# Patient Record
Sex: Male | Born: 1950 | Race: White | Hispanic: No | Marital: Married | Smoking: Former smoker
Health system: Southern US, Community
[De-identification: ages and names within clinical notes are randomized; demographics above are authoritative.]

## PROBLEM LIST (undated history)

## (undated) DIAGNOSIS — I10 Essential (primary) hypertension: Secondary | ICD-10-CM

## (undated) DIAGNOSIS — F419 Anxiety disorder, unspecified: Secondary | ICD-10-CM

## (undated) DIAGNOSIS — M1710 Unilateral primary osteoarthritis, unspecified knee: Secondary | ICD-10-CM

## (undated) DIAGNOSIS — J45909 Unspecified asthma, uncomplicated: Secondary | ICD-10-CM

## (undated) DIAGNOSIS — K219 Gastro-esophageal reflux disease without esophagitis: Secondary | ICD-10-CM

## (undated) DIAGNOSIS — G4700 Insomnia, unspecified: Secondary | ICD-10-CM

## (undated) DIAGNOSIS — M179 Osteoarthritis of knee, unspecified: Secondary | ICD-10-CM

## (undated) DIAGNOSIS — Z8489 Family history of other specified conditions: Secondary | ICD-10-CM

## (undated) DIAGNOSIS — E119 Type 2 diabetes mellitus without complications: Secondary | ICD-10-CM

## (undated) DIAGNOSIS — G709 Myoneural disorder, unspecified: Secondary | ICD-10-CM

## (undated) DIAGNOSIS — F429 Obsessive-compulsive disorder, unspecified: Secondary | ICD-10-CM

## (undated) HISTORY — DX: Insomnia, unspecified: G47.00

## (undated) HISTORY — PX: TONSILLECTOMY: SUR1361

## (undated) HISTORY — DX: Gastro-esophageal reflux disease without esophagitis: K21.9

## (undated) HISTORY — DX: Osteoarthritis of knee, unspecified: M17.9

## (undated) HISTORY — PX: JOINT REPLACEMENT: SHX530

## (undated) HISTORY — DX: Anxiety disorder, unspecified: F41.9

## (undated) HISTORY — DX: Essential (primary) hypertension: I10

## (undated) HISTORY — DX: Obsessive-compulsive disorder, unspecified: F42.9

## (undated) HISTORY — PX: OTHER SURGICAL HISTORY: SHX169

## (undated) HISTORY — DX: Unilateral primary osteoarthritis, unspecified knee: M17.10

## (undated) HISTORY — DX: Type 2 diabetes mellitus without complications: E11.9

---

## 1973-09-27 HISTORY — PX: ANKLE SURGERY: SHX546

## 2005-08-27 DIAGNOSIS — N529 Male erectile dysfunction, unspecified: Secondary | ICD-10-CM | POA: Insufficient documentation

## 2006-09-27 DIAGNOSIS — E781 Pure hyperglyceridemia: Secondary | ICD-10-CM | POA: Insufficient documentation

## 2009-03-10 DIAGNOSIS — G4700 Insomnia, unspecified: Secondary | ICD-10-CM | POA: Insufficient documentation

## 2009-03-10 DIAGNOSIS — F429 Obsessive-compulsive disorder, unspecified: Secondary | ICD-10-CM | POA: Insufficient documentation

## 2010-05-08 DIAGNOSIS — K2100 Gastro-esophageal reflux disease with esophagitis, without bleeding: Secondary | ICD-10-CM | POA: Insufficient documentation

## 2015-02-26 ENCOUNTER — Other Ambulatory Visit: Payer: Self-pay | Admitting: Family Medicine

## 2015-02-26 DIAGNOSIS — I10 Essential (primary) hypertension: Secondary | ICD-10-CM

## 2015-02-26 MED ORDER — DILTIAZEM HCL ER 180 MG PO CP24: ORAL_CAPSULE | ORAL | Status: DC

## 2015-02-27 ENCOUNTER — Other Ambulatory Visit: Payer: Self-pay | Admitting: Family Medicine

## 2015-03-16 ENCOUNTER — Other Ambulatory Visit: Payer: Self-pay | Admitting: Family Medicine

## 2015-03-28 ENCOUNTER — Other Ambulatory Visit: Payer: Self-pay | Admitting: Family Medicine

## 2015-03-28 DIAGNOSIS — E119 Type 2 diabetes mellitus without complications: Secondary | ICD-10-CM

## 2015-03-28 NOTE — Telephone Encounter (Signed)
Pharmacy request refill. Patient last office visit was 09/05/2014. HgbA1c was 5.8 at that visit.

## 2015-05-26 ENCOUNTER — Encounter: Payer: Self-pay | Admitting: Family Medicine

## 2015-05-26 ENCOUNTER — Ambulatory Visit (INDEPENDENT_AMBULATORY_CARE_PROVIDER_SITE_OTHER): Payer: 59 | Admitting: Family Medicine

## 2015-05-26 VITALS — BP 122/62 | HR 62 | Temp 98.6°F | Resp 16 | Ht 69.0 in | Wt 207.0 lb

## 2015-05-26 DIAGNOSIS — E669 Obesity, unspecified: Secondary | ICD-10-CM | POA: Insufficient documentation

## 2015-05-26 DIAGNOSIS — E119 Type 2 diabetes mellitus without complications: Secondary | ICD-10-CM | POA: Insufficient documentation

## 2015-05-26 DIAGNOSIS — J019 Acute sinusitis, unspecified: Secondary | ICD-10-CM

## 2015-05-26 DIAGNOSIS — F419 Anxiety disorder, unspecified: Secondary | ICD-10-CM | POA: Insufficient documentation

## 2015-05-26 DIAGNOSIS — M1711 Unilateral primary osteoarthritis, right knee: Secondary | ICD-10-CM | POA: Insufficient documentation

## 2015-05-26 MED ORDER — AMOXICILLIN-POT CLAVULANATE 875-125 MG PO TABS: 1.0000 | ORAL_TABLET | Freq: Two times a day (BID) | ORAL | Status: DC

## 2015-05-26 NOTE — Progress Notes (Signed)
Subjective:     Patient ID: Kevin Bonilla, male   DOB: [birth year], 64 y.o.   MRN: [id number]  HPI  Chief Complaint  Patient presents with  . Sore Throat    Patient comes in office today with concerns of sore throat for the past 3-4 weeks which patient describes as stinging of the throat. Patient states that pain is located in both sides of his throat, this morning he reported spitting up blood after brusing teeth.   Reports bright red blood which did not come from coughing.States he has had 3 nosebleeds in the last 3 months in conjunction with allergy sx and exposure to dust in his place of employment. Reports heavy tobacco use until 1986. Currently on Prilosec for GERD.   Review of Systems  Constitutional: Positive for fatigue. Negative for fever and chills.  HENT: Positive for sneezing and sore throat. Negative for sinus pressure.        Objective:   Physical Exam  Constitutional: He appears well-developed and well-nourished. No distress.  Lymphadenopathy:    He has no cervical adenopathy.  No bleeding sites noticed on exam of his nasal passages or mouth. Teeth in poor repair. No posterior pharyngeal erythema/tonsils absent. No sinus tenderness on percussion.     Assessment:    1. Acute sinusitis, recurrence not specified, unspecified location - amoxicillin-clavulanate (AUGMENTIN) 875-125 MG per tablet; Take 1 tablet by mouth 2 (two) times daily.  Dispense: 20 tablet; Refill: 0    Plan:    ENT referral if bleeding recurrent.

## 2015-05-26 NOTE — Patient Instructions (Signed)
If you see recurrent bleeding call for ENT evaluation.

## 2015-07-29 ENCOUNTER — Other Ambulatory Visit: Payer: Self-pay | Admitting: *Deleted

## 2015-07-29 MED ORDER — BUSPIRONE HCL 5 MG PO TABS: 5.0000 mg | ORAL_TABLET | Freq: Two times a day (BID) | ORAL | Status: DC

## 2015-07-29 NOTE — Telephone Encounter (Signed)
Last ov 05/26/2015.

## 2015-07-30 ENCOUNTER — Other Ambulatory Visit: Payer: Self-pay | Admitting: Family Medicine

## 2015-07-30 MED ORDER — BUSPIRONE HCL 30 MG PO TABS: 30.0000 mg | ORAL_TABLET | Freq: Two times a day (BID) | ORAL | Status: DC

## 2015-08-06 ENCOUNTER — Encounter: Payer: Self-pay | Admitting: Family Medicine

## 2015-08-06 ENCOUNTER — Ambulatory Visit
Admission: RE | Admit: 2015-08-06 | Discharge: 2015-08-06 | Disposition: A | Discharge status: Home / Self Care | Payer: BLUE CROSS/BLUE SHIELD | Source: Ambulatory Visit | Attending: Family Medicine | Admitting: Family Medicine

## 2015-08-06 ENCOUNTER — Ambulatory Visit (INDEPENDENT_AMBULATORY_CARE_PROVIDER_SITE_OTHER): Payer: BLUE CROSS/BLUE SHIELD | Admitting: Family Medicine

## 2015-08-06 VITALS — BP 164/72 | HR 92 | Temp 98.8°F | Resp 16 | Ht 69.0 in | Wt 206.0 lb

## 2015-08-06 DIAGNOSIS — M79671 Pain in right foot: Secondary | ICD-10-CM

## 2015-08-06 DIAGNOSIS — F419 Anxiety disorder, unspecified: Secondary | ICD-10-CM | POA: Diagnosis not present

## 2015-08-06 DIAGNOSIS — E781 Pure hyperglyceridemia: Secondary | ICD-10-CM

## 2015-08-06 DIAGNOSIS — E119 Type 2 diabetes mellitus without complications: Secondary | ICD-10-CM

## 2015-08-06 DIAGNOSIS — I10 Essential (primary) hypertension: Secondary | ICD-10-CM | POA: Diagnosis not present

## 2015-08-06 DIAGNOSIS — Z23 Encounter for immunization: Secondary | ICD-10-CM

## 2015-08-06 DIAGNOSIS — Z794 Long term (current) use of insulin: Secondary | ICD-10-CM | POA: Diagnosis not present

## 2015-08-06 LAB — POCT GLYCOSYLATED HEMOGLOBIN (HGB A1C)
ESTIMATED AVERAGE GLUCOSE: 114
HEMOGLOBIN A1C: 5.6

## 2015-08-06 MED ORDER — DICLOFENAC POTASSIUM 50 MG PO TABS: 50.0000 mg | ORAL_TABLET | Freq: Three times a day (TID) | ORAL | Status: AC

## 2015-08-06 MED ORDER — BUSPIRONE HCL 30 MG PO TABS: 30.0000 mg | ORAL_TABLET | Freq: Two times a day (BID) | ORAL | Status: DC

## 2015-08-06 MED ORDER — DILTIAZEM HCL ER 180 MG PO CP24: ORAL_CAPSULE | ORAL | Status: DC

## 2015-08-06 MED ORDER — VALSARTAN-HYDROCHLOROTHIAZIDE 320-25 MG PO TABS: 1.0000 | ORAL_TABLET | Freq: Every day | ORAL | Status: DC

## 2015-08-06 MED ORDER — METFORMIN HCL 500 MG PO TABS: ORAL_TABLET | ORAL | Status: DC

## 2015-08-06 NOTE — Progress Notes (Signed)
Patient: Kevin Bonilla Male    DOB: 1950/11/15   64 y.o.   MRN: [id number] Visit Date: 08/06/2015  Today's Provider: Mila Merry, MD   Chief Complaint  Patient presents with  . Foot Pain    Right   Subjective:    HPI  Patient got his right foot caught in a chang at home that was attached to a car and a truck, which he was working on 2 days ago. Patient fell with his foot still caught in the chang, his foot was twisted in the chang. Patient has swelling, heat, and some bruising.   Also is due for hga1c. Has been doing well on current dose of metformin. Occasionally checks sugars which are run in the low to mid 100s.   He is due for refills for all of his chronic medications. He feels buspar is working well and controlling his anxiety. Is tolerating well without adverse effects.   He is taking valsartan-hctz and tolerating well. Not checking home blood pressure.   Allergies  Allergen Reactions  . Lisinopril Nausea Only    Panic attacks   Previous Medications   ACETAMINOPHEN (TYLENOL) 500 MG TABLET    Take 500 mg by mouth every 8 (eight) hours as needed.   BUSPIRONE (BUSPAR) 30 MG TABLET    Take 1 tablet (30 mg total) by mouth 2 (two) times daily.   CLONAZEPAM (KLONOPIN) 1 MG TABLET    Take by mouth.   DILTIAZEM (DILACOR XR) 180 MG 24 HR CAPSULE    Take two capsules daily   METFORMIN (GLUCOPHAGE) 500 MG TABLET    TAKE ONE TABLET TWICE A DAY WITH FOOD   VALSARTAN-HYDROCHLOROTHIAZIDE (DIOVAN-HCT) 320-25 MG PER TABLET    TAKE 1 TABLET DAILY    Review of Systems  Constitutional: Negative for fever, chills, appetite change and fatigue.  Respiratory: Negative for cough, chest tightness and shortness of breath.   Cardiovascular: Negative for chest pain and palpitations.  Endocrine: Negative for polydipsia and polyuria.  Genitourinary: Negative for dysuria.  Neurological: Negative for dizziness and light-headedness.  Psychiatric/Behavioral: Negative for confusion,  sleep disturbance, dysphoric mood and decreased concentration. The patient is not nervous/anxious.     Social History  Substance Use Topics  . Smoking status: Former Games developer  . Smokeless tobacco: Not on file  . Alcohol Use: 0.0 oz/week    0 Standard drinks or equivalent per week     Comment: occasional   Objective:   There were no vitals taken for this visit.  Physical Exam   General Appearance:    Alert, cooperative, no distress, obese  Eyes:    PERRL, conjunctiva/corneas clear, EOM's intact       Lungs:     Clear to auscultation bilaterally, respirations unlabored  Heart:    Regular rate and rhythm  Neurologic:   Awake, alert, oriented x 3. No apparent focal neurological           defect.   MS:   Swollen slightly bluish discoloration across dorsal aspect of right mid foot. FROM of ankle and toes. N/v intact    Results for orders placed or performed in visit on 08/06/15  POCT glycosylated hemoglobin (Hb A1C)  Result Value Ref Range   Hemoglobin A1C 5.6    Est. average glucose Bld gHb Est-mCnc 114         Assessment & Plan:     1. Right foot pain Secondary to trauma - DG Foot Complete Right;  Future - diclofenac (CATAFLAM) 50 MG tablet; Take 1 tablet (50 mg total) by mouth 3 (three) times daily.  Dispense: 30 tablet; Refill: 0  2. Type 2 diabetes mellitus without complication, with long-term current use of insulin (HCC) Well controlled.  Continue current medications.   - metFORMIN (GLUCOPHAGE) 500 MG tablet; TAKE ONE TABLET TWICE A DAY WITH FOOD  Dispense: 60 tablet; Refill: 3 - POCT glycosylated hemoglobin (Hb A1C)  3. Essential hypertension Stable.Continue current medications.   - valsartan-hydrochlorothiazide (DIOVAN-HCT) 320-25 MG tablet; Take 1 tablet by mouth daily.  Dispense: 30 tablet; Refill: 3 - diltiazem (DILACOR XR) 180 MG 24 hr capsule; Take two capsules daily  Dispense: 30 capsule; Refill: 3 - Renal function panel  4. Anxiety Doing well on clonazepam  and buspirone  5. Hyperglyceridemia, pure  - Lipid panel  6. Need for influenza vaccination  - Flu Vaccine QUAD 36+ mos IM  Addressed extensive list of chronic and acute medical problems today requiring extensive time in counseling and coordination care.  Over half of this 45 minute visit were spent in counseling and coordinating care of multiple medical problems.       Mila Merry, MD  [facility]

## 2015-08-07 ENCOUNTER — Telehealth: Payer: Self-pay | Admitting: Family Medicine

## 2015-08-07 ENCOUNTER — Telehealth: Payer: Self-pay

## 2015-08-07 NOTE — Telephone Encounter (Signed)
Can call in rx for tramadol 50mg  one every eight hours as needed, #30, rf x 2. This may cause drowsiness and he should not take it when working, before driving,  or operating heavy machinery.

## 2015-08-07 NOTE — Telephone Encounter (Signed)
Called in Rx as below and patient was advised.

## 2015-08-07 NOTE — Telephone Encounter (Signed)
Please advise 

## 2015-08-07 NOTE — Telephone Encounter (Signed)
Pt is requesting his wife to pick up a work note this afternoon. Pt stated that he needs to be excused from work until Monday 08/11/15. Pt stated he had an OV with Dr. Sherrie Mustache on 08/06/15. Please advise. Thanks TNP

## 2015-08-07 NOTE — Telephone Encounter (Signed)
Advised patient of results.  

## 2015-08-07 NOTE — Telephone Encounter (Signed)
-----   Message from Malva Limes, MD sent at 08/07/2015  7:48 AM EST ----- A lot of soft tissue swelling, probably from bruising  Is seen, but no fractures of foot. Keep foot elevated when not ambulated. Should be much better over the weekend. Keep an area on the read patch on top of his foot. If it changes or spreads let me know.

## 2015-08-07 NOTE — Telephone Encounter (Signed)
Advised patient as below. Patient is requesting something for pain. He uses [facility] [employer]. Thanks!

## 2015-08-07 NOTE — Telephone Encounter (Signed)
Please review. Thanks!  

## 2015-08-07 NOTE — Telephone Encounter (Signed)
Pt is requesting results from his x-ray.  DE#[phone]/[id number]

## 2015-08-08 ENCOUNTER — Encounter: Payer: Self-pay | Admitting: *Deleted

## 2015-08-08 NOTE — Telephone Encounter (Signed)
That's find, he can have excuse and may return to [employer] on Monday.

## 2015-08-08 NOTE — Telephone Encounter (Signed)
Patient's wife Marylu Lund was notified that letter is ready to pick-up.

## 2015-08-11 ENCOUNTER — Other Ambulatory Visit: Payer: Self-pay | Admitting: *Deleted

## 2015-08-11 MED ORDER — CLONAZEPAM 1 MG PO TABS: 1.0000 mg | ORAL_TABLET | Freq: Two times a day (BID) | ORAL | Status: DC | PRN

## 2015-08-11 NOTE — Telephone Encounter (Signed)
Done. Prescription called into [facility] pharmacy.

## 2015-08-11 NOTE — Telephone Encounter (Signed)
Please call in clonazepam  

## 2015-09-24 ENCOUNTER — Ambulatory Visit: Payer: Self-pay | Admitting: Physician Assistant

## 2015-10-01 ENCOUNTER — Other Ambulatory Visit: Payer: Self-pay

## 2015-10-01 ENCOUNTER — Ambulatory Visit: Payer: BLUE CROSS/BLUE SHIELD | Admitting: Family Medicine

## 2015-10-01 ENCOUNTER — Inpatient Hospital Stay
Admission: EM | Admit: 2015-10-01 | Discharge: 2015-10-03 | DRG: 645 | Disposition: A | Discharge status: Home / Self Care | Payer: BLUE CROSS/BLUE SHIELD | Attending: Specialist | Admitting: Specialist

## 2015-10-01 DIAGNOSIS — R109 Unspecified abdominal pain: Secondary | ICD-10-CM | POA: Diagnosis present

## 2015-10-01 DIAGNOSIS — I10 Essential (primary) hypertension: Secondary | ICD-10-CM | POA: Diagnosis present

## 2015-10-01 DIAGNOSIS — E669 Obesity, unspecified: Secondary | ICD-10-CM | POA: Diagnosis present

## 2015-10-01 DIAGNOSIS — F429 Obsessive-compulsive disorder, unspecified: Secondary | ICD-10-CM | POA: Diagnosis present

## 2015-10-01 DIAGNOSIS — Z888 Allergy status to other drugs, medicaments and biological substances status: Secondary | ICD-10-CM

## 2015-10-01 DIAGNOSIS — E871 Hypo-osmolality and hyponatremia: Secondary | ICD-10-CM | POA: Diagnosis present

## 2015-10-01 DIAGNOSIS — E222 Syndrome of inappropriate secretion of antidiuretic hormone: Principal | ICD-10-CM | POA: Diagnosis present

## 2015-10-01 DIAGNOSIS — I350 Nonrheumatic aortic (valve) stenosis: Secondary | ICD-10-CM | POA: Diagnosis present

## 2015-10-01 DIAGNOSIS — Z6828 Body mass index (BMI) 28.0-28.9, adult: Secondary | ICD-10-CM | POA: Diagnosis not present

## 2015-10-01 DIAGNOSIS — R011 Cardiac murmur, unspecified: Secondary | ICD-10-CM | POA: Diagnosis not present

## 2015-10-01 DIAGNOSIS — M179 Osteoarthritis of knee, unspecified: Secondary | ICD-10-CM | POA: Diagnosis present

## 2015-10-01 DIAGNOSIS — K219 Gastro-esophageal reflux disease without esophagitis: Secondary | ICD-10-CM | POA: Diagnosis present

## 2015-10-01 DIAGNOSIS — G4700 Insomnia, unspecified: Secondary | ICD-10-CM | POA: Diagnosis present

## 2015-10-01 DIAGNOSIS — Z87891 Personal history of nicotine dependence: Secondary | ICD-10-CM

## 2015-10-01 DIAGNOSIS — R42 Dizziness and giddiness: Secondary | ICD-10-CM | POA: Diagnosis present

## 2015-10-01 DIAGNOSIS — E119 Type 2 diabetes mellitus without complications: Secondary | ICD-10-CM | POA: Diagnosis present

## 2015-10-01 DIAGNOSIS — F1010 Alcohol abuse, uncomplicated: Secondary | ICD-10-CM | POA: Diagnosis present

## 2015-10-01 DIAGNOSIS — R350 Frequency of micturition: Secondary | ICD-10-CM | POA: Diagnosis present

## 2015-10-01 DIAGNOSIS — R112 Nausea with vomiting, unspecified: Secondary | ICD-10-CM | POA: Diagnosis present

## 2015-10-01 DIAGNOSIS — Z79899 Other long term (current) drug therapy: Secondary | ICD-10-CM

## 2015-10-01 DIAGNOSIS — F419 Anxiety disorder, unspecified: Secondary | ICD-10-CM | POA: Diagnosis present

## 2015-10-01 LAB — URINALYSIS COMPLETE WITH MICROSCOPIC (ARMC ONLY)
BACTERIA UA: NONE SEEN
Bilirubin Urine: NEGATIVE
Glucose, UA: NEGATIVE mg/dL
LEUKOCYTES UA: NEGATIVE
NITRITE: NEGATIVE
PH: 7 (ref 5.0–8.0)
Protein, ur: 30 mg/dL — AB
Specific Gravity, Urine: 1.005 (ref 1.005–1.030)
Squamous Epithelial / LPF: NONE SEEN
WBC UA: NONE SEEN WBC/hpf (ref 0–5)

## 2015-10-01 LAB — BASIC METABOLIC PANEL
ANION GAP: 14 (ref 5–15)
BUN: 14 mg/dL (ref 6–20)
CO2: 23 mmol/L (ref 22–32)
Calcium: 9.3 mg/dL (ref 8.9–10.3)
Chloride: 82 mmol/L — ABNORMAL LOW (ref 101–111)
Creatinine, Ser: 1.07 mg/dL (ref 0.61–1.24)
GFR calc Af Amer: >60 mL/min (ref >60)
GLUCOSE: 143 mg/dL — AB (ref 65–99)
POTASSIUM: 4 mmol/L (ref 3.5–5.1)
Sodium: 119 mmol/L — CL (ref 135–145)

## 2015-10-01 LAB — SODIUM: SODIUM: 119 mmol/L — AB (ref 135–145)

## 2015-10-01 LAB — CBC
HEMATOCRIT: 38.6 % — AB (ref 40.0–52.0)
HEMOGLOBIN: 13.4 g/dL (ref 13.0–18.0)
MCH: 31.5 pg (ref 26.0–34.0)
MCHC: 34.6 g/dL (ref 32.0–36.0)
MCV: 91.1 fL (ref 80.0–100.0)
Platelets: 257 10*3/uL (ref 150–440)
RBC: 4.24 MIL/uL — AB (ref 4.40–5.90)
RDW: 13.5 % (ref 11.5–14.5)
WBC: 7.6 10*3/uL (ref 3.8–10.6)

## 2015-10-01 LAB — TSH: TSH: 1.45 u[IU]/mL (ref 0.350–4.500)

## 2015-10-01 LAB — SODIUM, URINE, RANDOM: SODIUM UR: 43 mmol/L

## 2015-10-01 LAB — GLUCOSE, CAPILLARY
GLUCOSE-CAPILLARY: 123 mg/dL — AB (ref 65–99)
Glucose-Capillary: 136 mg/dL — ABNORMAL HIGH (ref 65–99)
Glucose-Capillary: 109 mg/dL — ABNORMAL HIGH (ref 65–99)

## 2015-10-01 LAB — OSMOLALITY, URINE: Osmolality, Ur: 222 mosm/kg — ABNORMAL LOW (ref 300–900)

## 2015-10-01 MED ORDER — DILTIAZEM HCL ER COATED BEADS 180 MG PO CP24
180.0000 mg | ORAL_CAPSULE | Freq: Every day | ORAL | Status: DC
  Administered 2015-10-01 – 2015-10-02 (×2): 180 mg via ORAL
  Filled 2015-10-01 (×4): qty 1

## 2015-10-01 MED ORDER — THIAMINE HCL 100 MG/ML IJ SOLN
100.0000 mg | Freq: Every day | INTRAMUSCULAR | Status: DC
  Filled 2015-10-01: qty 2

## 2015-10-01 MED ORDER — ACETAMINOPHEN 650 MG RE SUPP
650.0000 mg | Freq: Four times a day (QID) | RECTAL | Status: DC | PRN
Start: 2015-10-01 — End: 2015-10-03

## 2015-10-01 MED ORDER — FOLIC ACID 1 MG PO TABS
1.0000 mg | ORAL_TABLET | Freq: Every day | ORAL | Status: DC
  Administered 2015-10-01 – 2015-10-02 (×2): 1 mg via ORAL
  Filled 2015-10-01 (×2): qty 1

## 2015-10-01 MED ORDER — CLONAZEPAM 1 MG PO TABS
1.0000 mg | ORAL_TABLET | Freq: Two times a day (BID) | ORAL | Status: DC | PRN
  Administered 2015-10-02: 1 mg via ORAL
  Filled 2015-10-01: qty 1

## 2015-10-01 MED ORDER — SODIUM CHLORIDE 0.9 % IV SOLN
INTRAVENOUS | Status: DC
  Administered 2015-10-01 – 2015-10-02 (×2): via INTRAVENOUS

## 2015-10-01 MED ORDER — LORAZEPAM 2 MG/ML IJ SOLN: 1.0000 mg | Freq: Four times a day (QID) | INTRAMUSCULAR | Status: DC | PRN

## 2015-10-01 MED ORDER — ONDANSETRON HCL 4 MG/2ML IJ SOLN: 4.0000 mg | Freq: Four times a day (QID) | INTRAMUSCULAR | Status: DC | PRN

## 2015-10-01 MED ORDER — BUSPIRONE HCL 10 MG PO TABS
30.0000 mg | ORAL_TABLET | Freq: Two times a day (BID) | ORAL | Status: DC
  Administered 2015-10-01 – 2015-10-02 (×4): 30 mg via ORAL
  Filled 2015-10-01 (×4): qty 3

## 2015-10-01 MED ORDER — INSULIN ASPART 100 UNIT/ML ~~LOC~~ SOLN
0.0000 [IU] | Freq: Three times a day (TID) | SUBCUTANEOUS | Status: DC
  Administered 2015-10-01: 2 [IU] via SUBCUTANEOUS
  Filled 2015-10-01 (×2): qty 2
  Filled 2015-10-01: qty 1

## 2015-10-01 MED ORDER — ONDANSETRON HCL 4 MG PO TABS: 4.0000 mg | ORAL_TABLET | Freq: Four times a day (QID) | ORAL | Status: DC | PRN

## 2015-10-01 MED ORDER — IPRATROPIUM-ALBUTEROL 0.5-2.5 (3) MG/3ML IN SOLN: 3.0000 mL | RESPIRATORY_TRACT | Status: DC | PRN

## 2015-10-01 MED ORDER — LORAZEPAM 1 MG PO TABS
1.0000 mg | ORAL_TABLET | Freq: Four times a day (QID) | ORAL | Status: DC | PRN
  Administered 2015-10-01: 1 mg via ORAL
  Filled 2015-10-01: qty 1

## 2015-10-01 MED ORDER — ACETAMINOPHEN 325 MG PO TABS: 650.0000 mg | ORAL_TABLET | Freq: Four times a day (QID) | ORAL | Status: DC | PRN

## 2015-10-01 MED ORDER — SODIUM CHLORIDE 0.9 % IV BOLUS (SEPSIS)
2000.0000 mL | Freq: Once | INTRAVENOUS | Status: AC
  Administered 2015-10-01: 1000 mL via INTRAVENOUS

## 2015-10-01 MED ORDER — VITAMIN B-1 100 MG PO TABS
100.0000 mg | ORAL_TABLET | Freq: Every day | ORAL | Status: DC
  Administered 2015-10-01 – 2015-10-02 (×2): 100 mg via ORAL
  Filled 2015-10-01 (×2): qty 1

## 2015-10-01 MED ORDER — OMEGA-3-ACID ETHYL ESTERS 1 G PO CAPS
1.0000 g | ORAL_CAPSULE | Freq: Every day | ORAL | Status: DC
  Administered 2015-10-01 – 2015-10-02 (×2): 1 g via ORAL
  Filled 2015-10-01 (×2): qty 1

## 2015-10-01 MED ORDER — INSULIN ASPART 100 UNIT/ML ~~LOC~~ SOLN: 0.0000 [IU] | Freq: Every day | SUBCUTANEOUS | Status: DC

## 2015-10-01 MED ORDER — ADULT MULTIVITAMIN W/MINERALS CH
1.0000 | ORAL_TABLET | Freq: Every day | ORAL | Status: DC
  Administered 2015-10-01 – 2015-10-02 (×2): 1 via ORAL
  Filled 2015-10-01 (×2): qty 1

## 2015-10-01 MED ORDER — ENOXAPARIN SODIUM 40 MG/0.4ML ~~LOC~~ SOLN
40.0000 mg | SUBCUTANEOUS | Status: DC
  Administered 2015-10-01: 40 mg via SUBCUTANEOUS
  Filled 2015-10-01 (×2): qty 0.4

## 2015-10-01 NOTE — ED Notes (Signed)
Pt c/o having shakes, dizziness, increased weakness, thirst for the past week.. States he fell 3 times due to weakness, states he takes PO meds for DM, states he has taken meds in a week because he FS was running low last week, states he took one this morning his FS 137.Kevin Bonilla Denies pain.Kevin Bonilla
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
## 2015-10-01 NOTE — Progress Notes (Signed)
Dr. Cherlynn Kaiser notified of critical sodium level of 119. Continue to monitor. No new orders.

## 2015-10-01 NOTE — ED Notes (Signed)
Pt reports nausea and vomiting x 2 days. States he vomited 3 x but denies diarrhea. Pt states he has had chills with temp 99.7. Pt also reports weakness and falling x 3 on Friday "but usually I have a reason." States he has fallen before but not as much. Pt is heavy drinker since 18, drinks half gallon of whiskey every 3 days. Quit smoking 30 years ago.
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
## 2015-10-02 ENCOUNTER — Telehealth: Payer: Self-pay | Admitting: Family Medicine

## 2015-10-02 ENCOUNTER — Inpatient Hospital Stay (HOSPITAL_COMMUNITY)
Admit: 2015-10-02 | Discharge: 2015-10-02 | Disposition: A | Discharge status: Home / Self Care | Payer: BLUE CROSS/BLUE SHIELD | Attending: Specialist | Admitting: Specialist

## 2015-10-02 DIAGNOSIS — R011 Cardiac murmur, unspecified: Secondary | ICD-10-CM

## 2015-10-02 LAB — BASIC METABOLIC PANEL WITH GFR
Anion gap: 10 (ref 5–15)
BUN: 10 mg/dL (ref 6–20)
CO2: 23 mmol/L (ref 22–32)
Calcium: 9.4 mg/dL (ref 8.9–10.3)
Chloride: 91 mmol/L — ABNORMAL LOW (ref 101–111)
Creatinine, Ser: 0.79 mg/dL (ref 0.61–1.24)
GFR calc Af Amer: >60 mL/min
GFR calc non Af Amer: >60 mL/min
Glucose, Bld: 107 mg/dL — ABNORMAL HIGH (ref 65–99)
Potassium: 3.9 mmol/L (ref 3.5–5.1)
Sodium: 124 mmol/L — ABNORMAL LOW (ref 135–145)

## 2015-10-02 LAB — CBC
HEMATOCRIT: 38.6 % — AB (ref 40.0–52.0)
Hemoglobin: 13.5 g/dL (ref 13.0–18.0)
MCH: 32.4 pg (ref 26.0–34.0)
MCHC: 35.1 g/dL (ref 32.0–36.0)
MCV: 92.2 fL (ref 80.0–100.0)
Platelets: 159 10*3/uL (ref 150–440)
RBC: 4.18 MIL/uL — ABNORMAL LOW (ref 4.40–5.90)
RDW: 13.6 % (ref 11.5–14.5)
WBC: 5.6 10*3/uL (ref 3.8–10.6)

## 2015-10-02 LAB — GLUCOSE, CAPILLARY
GLUCOSE-CAPILLARY: 132 mg/dL — AB (ref 65–99)
Glucose-Capillary: 117 mg/dL — ABNORMAL HIGH (ref 65–99)
Glucose-Capillary: 128 mg/dL — ABNORMAL HIGH (ref 65–99)
Glucose-Capillary: 108 mg/dL — ABNORMAL HIGH (ref 65–99)

## 2015-10-02 LAB — OSMOLALITY: OSMOLALITY: 247 mosm/kg — AB (ref 275–295)

## 2015-10-02 MED ORDER — IRBESARTAN 150 MG PO TABS: 300.0000 mg | ORAL_TABLET | Freq: Every day | ORAL | Status: DC

## 2015-10-02 MED ORDER — METFORMIN HCL 500 MG PO TABS
500.0000 mg | ORAL_TABLET | Freq: Two times a day (BID) | ORAL | Status: DC
  Administered 2015-10-02: 500 mg via ORAL
  Filled 2015-10-02: qty 1

## 2015-10-02 NOTE — Telephone Encounter (Signed)
Pt is requesting medication be sent to [facility] for gout. Pt stated he has diagnosed himself b/c his foot is red and swollen and it hurts for the sheet to lay over his foot. Pt stated he is still in the hospital and they won't give him anything for gout. I tried to explain that we can not treat over the phone or do anything while he is at the hospital. Pt yelled saying he just wants something for the gout. I advised I would send a request to Dr. Sherrie Mustache. Thanks TNP

## 2015-10-02 NOTE — Progress Notes (Signed)
Patient refused his insulin and lovenox because he does not want to be stuck with anymore needles.  He also requested to have his home dose of metformin ordered, which is 500mg  bid.  Dr. was notified and gave an order via telephone to order the metformin.

## 2015-10-02 NOTE — Progress Notes (Signed)
Patient has refused bed/chair alarm.  He is alert, oriented and steady on his feet.  His wife is at the bedside and states that he isn't weak or shaky like he was at home prior to coming into the hospital.

## 2015-10-02 NOTE — Progress Notes (Signed)
Dr. Cherlynn Kaiser was notified of serum osmolality of 245.  No new orders were given.

## 2015-10-02 NOTE — Progress Notes (Signed)
Initial Nutrition Assessment     INTERVENTION:  Meals and snacks: cater to pt preferences Medical Nutrition Supplement: if unable to meet nutritional needs will add supplement   NUTRITION DIAGNOSIS:   Predicted suboptimal nutrient intake related to  (Etoh use) as evidenced by  (pt reports skipping meals).   GOAL:   Patient will meet greater than or equal to 90% of their needs    MONITOR:    (Energy intake, Electrolyte and renal profile)  REASON FOR ASSESSMENT:   Malnutrition Screening Tool    ASSESSMENT:      Pt admitted with hyponatremia, poor po intake for 2-3 days prior to admission  Past Medical History  Diagnosis Date  . OCD (obsessive compulsive disorder)   . Anxiety   . Diabetes mellitus without complication (HCC)   . Insomnia   . GERD (gastroesophageal reflux disease)   . Osteoarthritis of knee   . Hypertension     Current Nutrition: eating 50-75% of meals per I and O sheet. Pt reports does not really like hospital foods  Food/Nutrition-Related History: reports typically eats 1 large meal in evening, snacks during the day.  Suspects gets additional kcals from etoh use   Scheduled Medications:  . busPIRone  30 mg Oral BID  . diltiazem  180 mg Oral Daily  . enoxaparin (LOVENOX) injection  40 mg Subcutaneous Q24H  . folic acid  1 mg Oral Daily  . insulin aspart  0-15 Units Subcutaneous TID WC  . insulin aspart  0-5 Units Subcutaneous QHS  . metFORMIN  500 mg Oral BID WC  . multivitamin with minerals  1 tablet Oral Daily  . omega-3 acid ethyl esters  1 g Oral Daily  . thiamine  100 mg Oral Daily   Or  . thiamine  100 mg Intravenous Daily    Continuous Medications:  . sodium chloride 75 mL/hr at 10/01/15 1209     Electrolyte/Renal Profile and Glucose Profile:   Recent Labs Lab 10/01/15 0717 10/01/15 1344 10/02/15 0342  NA 119* 119* 124*  K 4.0  --  3.9  CL 82*  --  91*  CO2 23  --  23  BUN 14  --  10  CREATININE 1.07  --  0.79   CALCIUM 9.3  --  9.4  GLUCOSE 143*  --  107*    Gastrointestinal Profile: Last FT:[id number]    Weight Change: noted 6% wt loss in the last 5 months    Diet Order:  Diet heart healthy/carb modified Room service appropriate?: Yes; Fluid consistency:: Thin  Skin:   reviewed   Height:   Ht Readings from Last 1 Encounters:  10/01/15 5\' 9"  (1.753 m)    Weight:   Wt Readings from Last 1 Encounters:  10/01/15 195 lb (88.451 kg)    Ideal Body Weight:     BMI:  Body mass index is 28.78 kg/(m^2).  Estimated Nutritional Needs:   Kcal:  BEE 1680 kcals (IF 1.0-1.3, AF 1.3) 11/29/15 kcals/d  Protein:  (1.0-1.2 g/kg) 89-107 g/d  Fluid:  (25-54ml/kg) 2225-2670 ml/d  EDUCATION NEEDS:   No education needs identified at this time  MODERATE Care Level  Dezi Brauner B. 01-11-2003, RD, LDN [phone] (pager) Weekend/On-Call pager [phone])

## 2015-10-02 NOTE — Progress Notes (Signed)
[facility] [place] at [facility]   PATIENT NAME: Kevin Bonilla    MR#:  [id number]  DATE OF BIRTH:  09-29-1950  SUBJECTIVE:   Patient here due to dizziness and weakness and noted to be hyponatremic. Sodium improved since yesterday. Denies any dizziness presently.  REVIEW OF SYSTEMS:    Review of Systems  Constitutional: Negative for fever and chills.  HENT: Negative for congestion and tinnitus.   Eyes: Negative for blurred vision and double vision.  Respiratory: Negative for cough, shortness of breath and wheezing.   Cardiovascular: Negative for chest pain, orthopnea and PND.  Gastrointestinal: Negative for nausea, vomiting, abdominal pain and diarrhea.  Genitourinary: Negative for dysuria and hematuria.  Neurological: Negative for dizziness, sensory change and focal weakness.  All other systems reviewed and are negative.   Nutrition: Heart healthy Tolerating Diet: Yes Tolerating PT: Ambulatory     DRUG ALLERGIES:   Allergies  Allergen Reactions  . Lisinopril Nausea Only    Panic attacks    VITALS:  Blood pressure 171/67, pulse 62, temperature 98.2 F (36.8 C), temperature source Oral, resp. rate 17, height 5\' 9"  (1.753 m), weight 88.451 kg (195 lb), SpO2 99 %.  PHYSICAL EXAMINATION:   Physical Exam  GENERAL:  65 y.o.-year-old patient lying in the bed with no acute distress.  EYES: Pupils equal, round, reactive to light and accommodation. No scleral icterus. Extraocular muscles intact.  HEENT: Head atraumatic, normocephalic. Oropharynx and nasopharynx clear.  NECK:  Supple, no jugular venous distention. No thyroid enlargement, no tenderness.  LUNGS: Normal breath sounds bilaterally, no wheezing, rales, rhonchi. No use of accessory muscles of respiration.  CARDIOVASCULAR: S1, S2 normal. 2 to 3/6 diastolic murmur heard at the base, No rubs, or gallops.  ABDOMEN: Soft, nontender, nondistended. Bowel sounds present. No organomegaly  or mass.  EXTREMITIES: No cyanosis, clubbing or edema b/l.    NEUROLOGIC: Cranial nerves II through XII are intact. No focal Motor or sensory deficits b/l.   PSYCHIATRIC: The patient is alert and oriented x 3.  SKIN: No obvious rash, lesion, or ulcer.    LABORATORY PANEL:   CBC  Recent Labs Lab 10/02/15 0342  WBC 5.6  HGB 13.5  HCT 38.6*  PLT 159   ------------------------------------------------------------------------------------------------------------------  Chemistries   Recent Labs Lab 10/02/15 0342  NA 124*  K 3.9  CL 91*  CO2 23  GLUCOSE 107*  BUN 10  CREATININE 0.79  CALCIUM 9.4   ------------------------------------------------------------------------------------------------------------------  Cardiac Enzymes No results for input(s): TROPONINI in the last 168 hours. ------------------------------------------------------------------------------------------------------------------  RADIOLOGY:  No results found.   ASSESSMENT AND PLAN:   65 year old male with past medical history of hypertension, GERD, anxiety, diabetes type 2 without complication, alcohol abuse, OCD, who presents to the hospital due to dizziness weakness and noted to be acutely hyponatremic.  #1 hyponatremia-This could be a combination of hypovolemic (volume loss due to N/V) and euvolemic hyponatremia (SIADH). -Serum Osm low. Urine sodium > 10.  Cont. IV fluids and follow sodium which improving.  - clinically feels better.   #2 dizziness/weakness-probably related to the hyponatremia. -Improved since yesterday and feels better.  #3 alcohol abuse-patient is at high risk for alcohol withdrawal. No evidence of withdrawal presently. - Continue CIWA protocol. -Continue thiamine and folate.  #4 hypertension-continue Cardizem, will resume valsartan. Hold HCTZ.  #5 Anxiety-continue Klonopin, BuSpar  #6 diabetes type 2 without complication- pt. Doesn't want to take insulin while in the  hospital.  - will d/c SSI. Cont. Metformin.  Follow BS  Possible d/c home tomorrow if sodium continues to improve.   All the records are reviewed and case discussed with Care Management/Social Workerr. Management plans discussed with the patient, family and they are in agreement.  CODE STATUS: Full  DVT Prophylaxis: Teds and SCDs  TOTAL TIME TAKING CARE OF THIS PATIENT: 30 minutes.   POSSIBLE D/C IN 1-2 DAYS, DEPENDING ON CLINICAL CONDITION.   Houston Siren M.D on 10/02/2015 at 3:30 PM  Between 7am to 6pm - Pager - [phone]  After 6pm go to [email or web address] - password [email or web address] [facility]  [place] [facility]  Office  [phone]  CC: Primary care physician; Mila Merry, MD

## 2015-10-02 NOTE — Progress Notes (Signed)
*  PRELIMINARY RESULTS* Echocardiogram 2D Echocardiogram has been performed.  Garrel Ridgel Stills 10/02/2015, 6:16 PM

## 2015-10-03 LAB — BASIC METABOLIC PANEL
ANION GAP: 9 (ref 5–15)
BUN: 9 mg/dL (ref 6–20)
CHLORIDE: 96 mmol/L — AB (ref 101–111)
CO2: 23 mmol/L (ref 22–32)
CREATININE: 0.8 mg/dL (ref 0.61–1.24)
Calcium: 9.3 mg/dL (ref 8.9–10.3)
GFR calc non Af Amer: >60 mL/min (ref >60)
Glucose, Bld: 129 mg/dL — ABNORMAL HIGH (ref 65–99)
POTASSIUM: 3.8 mmol/L (ref 3.5–5.1)
SODIUM: 128 mmol/L — AB (ref 135–145)

## 2015-10-03 LAB — GLUCOSE, CAPILLARY: GLUCOSE-CAPILLARY: 130 mg/dL — AB (ref 65–99)

## 2015-10-03 NOTE — Progress Notes (Signed)
Pt alert and oriented. Discharged to home. Concerns addressed. IV site removed by pt. Discharge summary given to pt. Pt anxious to leave and did not wait for secretary to call in appointment or to receive not from MD. Pt aware to call to set up appointment.

## 2015-10-03 NOTE — Discharge Summary (Signed)
[facility] - [place] at [facility]   PATIENT NAME: Kevin Bonilla    MR#:  [id number]  DATE OF BIRTH:  Mar 05, 1951  DATE OF ADMISSION:  10/01/2015 ADMITTING PHYSICIAN: Houston Siren, MD  DATE OF DISCHARGE: 10/03/2015 10:40 AM  PRIMARY CARE PHYSICIAN: Mila Merry, MD    ADMISSION DIAGNOSIS:  Hyponatremia [E87.1]  DISCHARGE DIAGNOSIS:  Active Problems:   Hyponatremia   SECONDARY DIAGNOSIS:   Past Medical History  Diagnosis Date  . OCD (obsessive compulsive disorder)   . Anxiety   . Diabetes mellitus without complication (HCC)   . Insomnia   . GERD (gastroesophageal reflux disease)   . Osteoarthritis of knee   . Hypertension     HOSPITAL COURSE:   65 year old male with past medical history of hypertension, GERD, anxiety, diabetes type 2 without complication, alcohol abuse, OCD, who presents to the hospital due to dizziness weakness and noted to be acutely hyponatremic.  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 -Patient was gently hydrated with IV fluids and his sodium improved from 119-128. He was clinically asymptomatic and feeling better and therefore discharged home. Also told to stop drinking alcohol heavily.  #2 dizziness/weakness-this was related to the hyponatremia and had resolved prior to discharge  #3 alcohol abuse-patient was at high risk for alcohol withdrawal and therefore was placed on CIWA protocol and also thiamine and folate. -While in the hospital he did not show any evidence of DTs or alcohol withdrawal. - he was strongly advised to quit drinking.   #4 hypertension-he will continue Cardizem, Valsartan/HCTZ.   #5 Anxiety- he will continue Klonopin, BuSpar  #6 diabetes type 2 without complication- he will continue his Metformin  #7 Murmur - echo obtained which showed mild aortic stenosis. Follow up as outpatient.   DISCHARGE CONDITIONS:   Stable.    CONSULTS OBTAINED:     DRUG ALLERGIES:   Allergies  Allergen Reactions  . Lisinopril Nausea Only    Panic attacks    DISCHARGE MEDICATIONS:   Discharge Medication List as of 10/03/2015  9:44 AM    CONTINUE these medications which have NOT CHANGED   Details  acetaminophen (TYLENOL) 500 MG tablet Take 500 mg by mouth every 8 (eight) hours as needed., Until Discontinued, Historical Med    busPIRone (BUSPAR) 30 MG tablet Take 1 tablet (30 mg total) by mouth 2 (two) times daily., Starting 08/06/2015, Until Discontinued, Normal    clonazePAM (KLONOPIN) 1 MG tablet Take 1 tablet (1 mg total) by mouth every 12 (twelve) hours as needed for anxiety., Starting 08/11/2015, Until Discontinued, Phone In    diltiazem (DILACOR XR) 180 MG 24 hr capsule Take two capsules daily, Normal    metFORMIN (GLUCOPHAGE) 500 MG tablet TAKE ONE TABLET TWICE A DAY WITH FOOD, Normal    Omega-3 Fatty Acids (FISH OIL) 1000 MG CAPS Take 2 capsules by mouth daily., Until Discontinued, Historical Med    valsartan-hydrochlorothiazide (DIOVAN-HCT) 320-25 MG tablet Take 1 tablet by mouth daily., Starting 08/06/2015, Until Discontinued, Normal         DISCHARGE INSTRUCTIONS:   DIET:  Diabetic diet, Cardiac diet  DISCHARGE CONDITION:  Stable  ACTIVITY:  Activity as tolerated  OXYGEN:  Home Oxygen: No.   Oxygen Delivery: room air  DISCHARGE LOCATION:  home   If you experience worsening of your admission symptoms, develop shortness of breath, life threatening emergency, suicidal or homicidal thoughts you must seek medical attention immediately by calling 911 or  calling your MD immediately  if symptoms less severe.  You Must read complete instructions/literature along with all the possible adverse reactions/side effects for all the Medicines you take and that have been prescribed to you. Take any new Medicines after you have completely understood and accpet all the possible adverse reactions/side effects.    Please note  You were cared for by a hospitalist during your hospital stay. If you have any questions about your discharge medications or the care you received while you were in the hospital after you are discharged, you can call the unit and asked to speak with the hospitalist on call if the hospitalist that took care of you is not available. Once you are discharged, your primary care physician will handle any further medical issues. Please note that NO REFILLS for any discharge medications will be authorized once you are discharged, as it is imperative that you return to your primary care physician (or establish a relationship with a primary care physician if you do not have one) for your aftercare needs so that they can reassess your need for medications and monitor your lab values.     Today   No complaints presently. Feels better.   VITAL SIGNS:  Blood pressure 150/60, pulse 68, temperature 98.4 F (36.9 C), temperature source Oral, resp. rate 18, height 5\' 9"  (1.753 m), weight 88.451 kg (195 lb), SpO2 99 %.  I/O:   Intake/Output Summary (Last 24 hours) at 10/03/15 1638 Last data filed at 10/03/15 0900  Gross per 24 hour  Intake 1323.73 ml  Output    700 ml  Net 623.73 ml    PHYSICAL EXAMINATION:   GENERAL: 65 y.o.-year-old patient lying in the bed with no acute distress.  EYES: Pupils equal, round, reactive to light and accommodation. No scleral icterus. Extraocular muscles intact.  HEENT: Head atraumatic, normocephalic. Oropharynx and nasopharynx clear.  NECK: Supple, no jugular venous distention. No thyroid enlargement, no tenderness.  LUNGS: Normal breath sounds bilaterally, no wheezing, rales, rhonchi. No use of accessory muscles of respiration.  CARDIOVASCULAR: S1, S2 normal. 2 to 3/6 diastolic murmur heard at the base, No rubs, or gallops.  ABDOMEN: Soft, nontender, nondistended. Bowel sounds present. No organomegaly or mass.  EXTREMITIES: No cyanosis,  clubbing or edema b/l.  NEUROLOGIC: Cranial nerves II through XII are intact. No focal Motor or sensory deficits b/l.  PSYCHIATRIC: The patient is alert and oriented x 3.  SKIN: No obvious rash, lesion, or ulcer.   DATA REVIEW:   CBC  Recent Labs Lab 10/02/15 0342  WBC 5.6  HGB 13.5  HCT 38.6*  PLT 159    Chemistries   Recent Labs Lab 10/03/15 0432  NA 128*  K 3.8  CL 96*  CO2 23  GLUCOSE 129*  BUN 9  CREATININE 0.80  CALCIUM 9.3    Cardiac Enzymes No results for input(s): TROPONINI in the last 168 hours.  Microbiology Results  No results found for this or any previous visit.  RADIOLOGY:  No results found.    Management plans discussed with the patient, family and they are in agreement.  CODE STATUS:  Advance Directive Documentation        Most Recent Value   Type of Advance Directive  Living will   Pre-existing out of facility DNR order (yellow form or pink MOST form)     "MOST" Form in Place?        TOTAL TIME TAKING CARE OF THIS PATIENT: 40 minutes.  Houston Siren M.D on 10/03/2015 at 4:38 PM  Between 7am to 6pm - Pager - [phone]  After 6pm go to [email or web address] - password [email or web address] [facility]  [place] [facility]  Office  [phone]  CC: Primary care physician; Mila Merry, MD

## 2015-10-03 NOTE — Progress Notes (Signed)
[facility]    Kevin Bonilla was admitted to the Hospital on 10/01/2015 and Discharged  10/03/2015 and should be excused from work/school   for 4 days starting 10/01/2015 , may return to work/school without any restrictions.  Call Hilda Lias MD, [facility]  [phone] with questions.  Houston Siren M.D on 10/03/2015,at 8:27 AM

## 2015-10-10 ENCOUNTER — Ambulatory Visit
Admission: RE | Admit: 2015-10-10 | Discharge: 2015-10-10 | Disposition: A | Discharge status: Home / Self Care | Payer: BLUE CROSS/BLUE SHIELD | Source: Ambulatory Visit | Attending: Family Medicine | Admitting: Family Medicine

## 2015-10-10 ENCOUNTER — Ambulatory Visit (INDEPENDENT_AMBULATORY_CARE_PROVIDER_SITE_OTHER): Payer: BLUE CROSS/BLUE SHIELD | Admitting: Family Medicine

## 2015-10-10 ENCOUNTER — Encounter: Payer: Self-pay | Admitting: Family Medicine

## 2015-10-10 VITALS — BP 132/58 | HR 54 | Temp 97.9°F | Resp 16 | Ht 69.0 in | Wt 199.0 lb

## 2015-10-10 DIAGNOSIS — M546 Pain in thoracic spine: Secondary | ICD-10-CM | POA: Diagnosis not present

## 2015-10-10 DIAGNOSIS — M47814 Spondylosis without myelopathy or radiculopathy, thoracic region: Secondary | ICD-10-CM | POA: Insufficient documentation

## 2015-10-10 DIAGNOSIS — E871 Hypo-osmolality and hyponatremia: Secondary | ICD-10-CM | POA: Diagnosis not present

## 2015-10-10 NOTE — Patient Instructions (Signed)
Go to the outpatient imaging center across the street from [facility] for xray of your back.

## 2015-10-10 NOTE — Progress Notes (Signed)
Patient: Kevin DUNNAWAY Male    DOB: 1951/02/23   65 y.o.   MRN: [id number] Visit Date: 10/10/2015  Today's Provider: Mila Merry, MD   Chief Complaint  Patient presents with  . Hospitalization Follow-up   Subjective:    HPI   Follow up Hospitalization  Patient was admitted to [facility] on 10/01/2015 and discharged on 10/03/2015. He was treated for Dizziness and Weakness. Treatment for this included labs, ekg. Found to be hyponatremic to 119 with resulting neurologic dysfunction. Given IV normal saline for treatment and plan to hospitalize for further management. Patient was advised to stop drinking alcohol heavily. He admits to drinking alcohol heavily in the weeks prior to admission and drinking excessive amount of water because alcohol made him thirsty. He states he has stopped drinking alcohol completely since discharge and will not restart.  He reports good compliance with treatment. He reports this condition is Improved.  ----------------------------------------------------------------------  Patient has been having mid-back pain for a while. Back pain is worse when he walks around a lot at work. Pain does not radiate. Pain gets so severe he has a hard time walking.  \  He also complains of pain in midline of his upper back. He had several chiropractic treatments which helped pain for a few days, but it hurts as bad as ever now. Does no recall any particular injury.   Allergies  Allergen Reactions  . Lisinopril Nausea Only    Panic attacks   Previous Medications   ACETAMINOPHEN (TYLENOL) 500 MG TABLET    Take 500 mg by mouth every 8 (eight) hours as needed.   BUSPIRONE (BUSPAR) 30 MG TABLET    Take 1 tablet (30 mg total) by mouth 2 (two) times daily.   CLONAZEPAM (KLONOPIN) 1 MG TABLET    Take 1 tablet (1 mg total) by mouth every 12 (twelve) hours as needed for anxiety.   DILTIAZEM (DILACOR XR) 180 MG 24 HR CAPSULE    Take two capsules daily   METFORMIN  (GLUCOPHAGE) 500 MG TABLET    TAKE ONE TABLET TWICE A DAY WITH FOOD   OMEGA-3 FATTY ACIDS (FISH OIL) 1000 MG CAPS    Take 2 capsules by mouth daily.   VALSARTAN-HYDROCHLOROTHIAZIDE (DIOVAN-HCT) 320-25 MG TABLET    Take 1 tablet by mouth daily.    Review of Systems  Constitutional: Negative for fever, chills and appetite change.       Appetite has pick-up.  Respiratory: Negative for chest tightness, shortness of breath and wheezing.   Cardiovascular: Negative for chest pain and palpitations.  Gastrointestinal: Negative for nausea, vomiting and abdominal pain.  Musculoskeletal: Positive for back pain.    Social History  Substance Use Topics  . Smoking status: Former Games developer  . Smokeless tobacco: Not on file  . Alcohol Use: 0.0 oz/week    0 Standard drinks or equivalent per week     Comment: occasional   Objective:   BP 132/58 mmHg  Pulse 54  Temp(Src) 97.9 F (36.6 C) (Oral)  Resp 16  Ht 5\' 9"  (1.753 m)  Wt 199 lb (90.266 kg)  BMI 29.37 kg/m2  SpO2 97%  Physical Exam   General Appearance:    Alert, cooperative, no distress  Eyes:    PERRL, conjunctiva/corneas clear, EOM's intact       Lungs:     Clear to auscultation bilaterally, respirations unlabored  Heart:    Regular rate and rhythm  Neurologic:   Awake, alert, oriented  x 3. No apparent focal neurological           defect.   MS:    Tender midline of T-spine around T5-T6. No swelling or erythema.        Assessment & Plan:     1. Hyponatremia Likely secondary to EtOH abuse, and has stopped drinking alcohol. Check labs to ensure normalization of electrolytes.  - Comprehensive metabolic panel  2. Midline thoracic back pain  - DG Thoracic Spine W/Swimmers; Future       Mila Merry, MD  [facility]

## 2015-10-11 LAB — COMPREHENSIVE METABOLIC PANEL
ALT: 42 IU/L (ref 0–44)
AST: 16 IU/L (ref 0–40)
Albumin/Globulin Ratio: 2.1 (ref 1.1–2.5)
Albumin: 4.5 g/dL (ref 3.6–4.8)
Alkaline Phosphatase: 52 IU/L (ref 39–117)
BUN/Creatinine Ratio: 14 (ref 10–22)
BUN: 20 mg/dL (ref 8–27)
Bilirubin Total: 0.5 mg/dL (ref 0.0–1.2)
CALCIUM: 9.8 mg/dL (ref 8.6–10.2)
CO2: 23 mmol/L (ref 18–29)
CREATININE: 1.38 mg/dL — AB (ref 0.76–1.27)
Chloride: 96 mmol/L (ref 96–106)
GFR calc Af Amer: 63 mL/min/{1.73_m2} (ref >59)
GFR, EST NON AFRICAN AMERICAN: 54 mL/min/{1.73_m2} — AB (ref >59)
GLOBULIN, TOTAL: 2.1 g/dL (ref 1.5–4.5)
Glucose: 92 mg/dL (ref 65–99)
Potassium: 5.2 mmol/L (ref 3.5–5.2)
SODIUM: 133 mmol/L — AB (ref 134–144)
Total Protein: 6.6 g/dL (ref 6.0–8.5)

## 2015-10-13 ENCOUNTER — Telehealth: Payer: Self-pay

## 2015-10-13 ENCOUNTER — Telehealth: Payer: Self-pay | Admitting: Family Medicine

## 2015-10-13 DIAGNOSIS — M545 Low back pain: Secondary | ICD-10-CM

## 2015-10-13 MED ORDER — NAPROXEN 500 MG PO TABS: 500.0000 mg | ORAL_TABLET | Freq: Two times a day (BID) | ORAL | Status: DC | PRN

## 2015-10-13 MED ORDER — TRAMADOL HCL 50 MG PO TABS: 50.0000 mg | ORAL_TABLET | ORAL | Status: DC | PRN

## 2015-10-13 NOTE — Telephone Encounter (Signed)
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 Can try naprosyn 500mg  twice daily as needed for back pain. #60, rf  X 3

## 2015-10-13 NOTE — Telephone Encounter (Signed)
It takes some time to work and may take few doses to feel better, but there is no cure for arthritis.  He needs to give it a few days  He can also take tramadol 50mg  every four hourse as needed, #30, rf x 1 (has to be called in.

## 2015-10-13 NOTE — Telephone Encounter (Signed)
Please advise 

## 2015-10-13 NOTE — Telephone Encounter (Signed)
Patient was notified. Patient expressed understanding. Rx called into pharmacy.

## 2015-10-13 NOTE — Telephone Encounter (Signed)
Left message to call back  

## 2015-10-13 NOTE — Telephone Encounter (Signed)
Pt states he took the Rx naproxen (NAPROSYN) 500 MG tablet about 3 hours ago and it has given him no relief from pain.  Pt is requesting a stronger Rx.  [facility].  PR#[id number]

## 2015-10-13 NOTE — Telephone Encounter (Signed)
Advised patient as below. Medication sent into pharmacy.  

## 2015-10-17 ENCOUNTER — Telehealth: Payer: Self-pay | Admitting: Family Medicine

## 2015-10-17 DIAGNOSIS — M1990 Unspecified osteoarthritis, unspecified site: Secondary | ICD-10-CM

## 2015-10-17 MED ORDER — MELOXICAM 15 MG PO TABS: 15.0000 mg | ORAL_TABLET | Freq: Every day | ORAL | Status: DC

## 2015-10-17 NOTE — Telephone Encounter (Signed)
Please advise 

## 2015-10-17 NOTE — Telephone Encounter (Signed)
Called rx into pharmacy. Unable to reach pt.

## 2015-10-17 NOTE — Telephone Encounter (Signed)
Pt states Dr put him on traMADol (ULTRAM) 50 MG tablet   last Friday for his back pain pt states it's not helping would like something else for pain pt uses [facility].  Thanks CC

## 2015-10-17 NOTE — Telephone Encounter (Signed)
Can call in Meloxicam 15mg  daily, #30, rf x 1 (and discontinue naproxen), and apply OTC 4% Lidocaine patch as normal. He has arthritis. Arthritis cannot be cured, but this should make pain tolerable. If not we will need to refer him to orthopedics.

## 2015-10-20 NOTE — Telephone Encounter (Signed)
Spoke with patient and advised him as below. Patient states he has already picked up the Meloxicam and has tried using the OTC Lidocaine patches with no relief. Patient states this medication is not enough to help with him pain. Patient agrees to Orthopedics referral. Patient wants something else that can better control his pain until he sees Orthopedics. I have already placed an order in patient chart for Orthopedics referral. .

## 2015-11-03 DIAGNOSIS — M47816 Spondylosis without myelopathy or radiculopathy, lumbar region: Secondary | ICD-10-CM | POA: Insufficient documentation

## 2015-11-05 ENCOUNTER — Telehealth: Payer: Self-pay | Admitting: Family Medicine

## 2015-11-05 NOTE — Telephone Encounter (Signed)
Pt would like to get a copy of his latest A1C. Thanks TNP

## 2015-11-05 NOTE — Telephone Encounter (Signed)
Left message letting pt know that lab results have been printed and is waiting up front for pick-up.

## 2015-11-14 DIAGNOSIS — Z8639 Personal history of other endocrine, nutritional and metabolic disease: Secondary | ICD-10-CM | POA: Insufficient documentation

## 2015-12-19 ENCOUNTER — Other Ambulatory Visit: Payer: Self-pay | Admitting: Family Medicine

## 2016-03-12 ENCOUNTER — Other Ambulatory Visit: Payer: Self-pay | Admitting: Family Medicine

## 2016-03-12 NOTE — Telephone Encounter (Signed)
Please call in clonazepam  

## 2016-03-12 NOTE — Telephone Encounter (Signed)
Rx called in to pharmacy. 

## 2016-04-15 ENCOUNTER — Other Ambulatory Visit: Payer: Self-pay | Admitting: Internal Medicine

## 2016-04-15 DIAGNOSIS — R9389 Abnormal findings on diagnostic imaging of other specified body structures: Secondary | ICD-10-CM

## 2016-04-29 ENCOUNTER — Ambulatory Visit
Admission: RE | Admit: 2016-04-29 | Discharge: 2016-04-29 | Disposition: A | Discharge status: Home / Self Care | Payer: BLUE CROSS/BLUE SHIELD | Source: Ambulatory Visit | Attending: Internal Medicine | Admitting: Internal Medicine

## 2016-04-29 DIAGNOSIS — R911 Solitary pulmonary nodule: Secondary | ICD-10-CM | POA: Diagnosis not present

## 2016-04-29 DIAGNOSIS — I2510 Atherosclerotic heart disease of native coronary artery without angina pectoris: Secondary | ICD-10-CM | POA: Insufficient documentation

## 2016-04-29 DIAGNOSIS — R9389 Abnormal findings on diagnostic imaging of other specified body structures: Secondary | ICD-10-CM

## 2016-04-29 DIAGNOSIS — R938 Abnormal findings on diagnostic imaging of other specified body structures: Secondary | ICD-10-CM | POA: Diagnosis present

## 2016-04-29 LAB — POCT I-STAT CREATININE: CREATININE: 1.2 mg/dL (ref 0.61–1.24)

## 2016-04-29 MED ORDER — IOPAMIDOL (ISOVUE-370) INJECTION 76%
75.0000 mL | Freq: Once | INTRAVENOUS | Status: AC | PRN
  Administered 2016-04-29: 75 mL via INTRAVENOUS

## 2016-05-04 ENCOUNTER — Other Ambulatory Visit: Payer: Self-pay | Admitting: Family Medicine

## 2016-05-04 ENCOUNTER — Other Ambulatory Visit: Payer: Self-pay | Admitting: Internal Medicine

## 2016-05-04 DIAGNOSIS — R911 Solitary pulmonary nodule: Secondary | ICD-10-CM

## 2016-07-01 ENCOUNTER — Other Ambulatory Visit: Payer: Self-pay | Admitting: Internal Medicine

## 2016-07-01 ENCOUNTER — Ambulatory Visit
Admission: RE | Admit: 2016-07-01 | Discharge: 2016-07-01 | Disposition: A | Discharge status: Home / Self Care | Payer: Disability Insurance | Source: Ambulatory Visit | Attending: Internal Medicine | Admitting: Internal Medicine

## 2016-07-01 DIAGNOSIS — M85811 Other specified disorders of bone density and structure, right shoulder: Secondary | ICD-10-CM | POA: Diagnosis not present

## 2016-07-01 DIAGNOSIS — M069 Rheumatoid arthritis, unspecified: Secondary | ICD-10-CM | POA: Diagnosis not present

## 2016-07-01 DIAGNOSIS — I739 Peripheral vascular disease, unspecified: Secondary | ICD-10-CM | POA: Insufficient documentation

## 2016-07-08 ENCOUNTER — Other Ambulatory Visit: Payer: Self-pay | Admitting: Family Medicine

## 2016-07-22 ENCOUNTER — Other Ambulatory Visit: Payer: Self-pay | Admitting: Family Medicine

## 2016-09-06 ENCOUNTER — Other Ambulatory Visit: Payer: Self-pay | Admitting: Family Medicine

## 2016-09-16 ENCOUNTER — Other Ambulatory Visit: Payer: Self-pay | Admitting: Family Medicine

## 2016-09-16 DIAGNOSIS — Z794 Long term (current) use of insulin: Principal | ICD-10-CM

## 2016-09-16 DIAGNOSIS — E119 Type 2 diabetes mellitus without complications: Secondary | ICD-10-CM

## 2016-09-22 ENCOUNTER — Other Ambulatory Visit: Payer: Self-pay | Admitting: Family Medicine

## 2016-09-22 NOTE — Telephone Encounter (Signed)
Please call in clonazepam  

## 2016-09-22 NOTE — Telephone Encounter (Signed)
Rx called in to pharmacy. 

## 2016-10-25 ENCOUNTER — Ambulatory Visit: Payer: BLUE CROSS/BLUE SHIELD

## 2017-01-09 ENCOUNTER — Encounter (HOSPITAL_COMMUNITY): Admission: EM | Disposition: A | Discharge status: Home Health | Payer: Self-pay | Source: Home / Self Care

## 2017-01-09 ENCOUNTER — Emergency Department (HOSPITAL_COMMUNITY): Payer: BLUE CROSS/BLUE SHIELD | Admitting: Anesthesiology

## 2017-01-09 ENCOUNTER — Emergency Department (HOSPITAL_COMMUNITY): Payer: BLUE CROSS/BLUE SHIELD

## 2017-01-09 ENCOUNTER — Inpatient Hospital Stay (HOSPITAL_COMMUNITY): Payer: BLUE CROSS/BLUE SHIELD

## 2017-01-09 ENCOUNTER — Encounter (HOSPITAL_COMMUNITY): Payer: Self-pay | Admitting: *Deleted

## 2017-01-09 ENCOUNTER — Inpatient Hospital Stay (HOSPITAL_COMMUNITY)
Admission: EM | Admit: 2017-01-09 | Discharge: 2017-01-12 | DRG: 956 | Disposition: A | Discharge status: Home Health | Payer: BLUE CROSS/BLUE SHIELD | Attending: Orthopaedic Surgery | Admitting: Orthopaedic Surgery

## 2017-01-09 DIAGNOSIS — S7292XA Unspecified fracture of left femur, initial encounter for closed fracture: Secondary | ICD-10-CM

## 2017-01-09 DIAGNOSIS — I10 Essential (primary) hypertension: Secondary | ICD-10-CM | POA: Diagnosis present

## 2017-01-09 DIAGNOSIS — S27321A Contusion of lung, unilateral, initial encounter: Secondary | ICD-10-CM | POA: Diagnosis present

## 2017-01-09 DIAGNOSIS — Y9352 Activity, horseback riding: Secondary | ICD-10-CM

## 2017-01-09 DIAGNOSIS — M25552 Pain in left hip: Secondary | ICD-10-CM | POA: Diagnosis present

## 2017-01-09 DIAGNOSIS — S72002A Fracture of unspecified part of neck of left femur, initial encounter for closed fracture: Secondary | ICD-10-CM

## 2017-01-09 DIAGNOSIS — W19XXXA Unspecified fall, initial encounter: Secondary | ICD-10-CM

## 2017-01-09 DIAGNOSIS — S72142A Displaced intertrochanteric fracture of left femur, initial encounter for closed fracture: Secondary | ICD-10-CM | POA: Diagnosis present

## 2017-01-09 DIAGNOSIS — Z79899 Other long term (current) drug therapy: Secondary | ICD-10-CM | POA: Diagnosis not present

## 2017-01-09 DIAGNOSIS — J449 Chronic obstructive pulmonary disease, unspecified: Secondary | ICD-10-CM | POA: Diagnosis present

## 2017-01-09 DIAGNOSIS — R011 Cardiac murmur, unspecified: Secondary | ICD-10-CM | POA: Diagnosis present

## 2017-01-09 DIAGNOSIS — Z7984 Long term (current) use of oral hypoglycemic drugs: Secondary | ICD-10-CM | POA: Diagnosis not present

## 2017-01-09 DIAGNOSIS — R911 Solitary pulmonary nodule: Secondary | ICD-10-CM | POA: Diagnosis present

## 2017-01-09 DIAGNOSIS — Z7951 Long term (current) use of inhaled steroids: Secondary | ICD-10-CM | POA: Diagnosis not present

## 2017-01-09 DIAGNOSIS — Z888 Allergy status to other drugs, medicaments and biological substances status: Secondary | ICD-10-CM | POA: Diagnosis not present

## 2017-01-09 DIAGNOSIS — S2243XA Multiple fractures of ribs, bilateral, initial encounter for closed fracture: Secondary | ICD-10-CM | POA: Diagnosis present

## 2017-01-09 DIAGNOSIS — E119 Type 2 diabetes mellitus without complications: Secondary | ICD-10-CM | POA: Diagnosis present

## 2017-01-09 DIAGNOSIS — S2241XA Multiple fractures of ribs, right side, initial encounter for closed fracture: Secondary | ICD-10-CM

## 2017-01-09 DIAGNOSIS — S72143S Displaced intertrochanteric fracture of unspecified femur, sequela: Secondary | ICD-10-CM

## 2017-01-09 HISTORY — PX: FEMUR IM NAIL: SHX1597

## 2017-01-09 HISTORY — DX: Family history of other specified conditions: Z84.89

## 2017-01-09 HISTORY — DX: Myoneural disorder, unspecified: G70.9

## 2017-01-09 HISTORY — DX: Unspecified asthma, uncomplicated: J45.909

## 2017-01-09 LAB — COMPREHENSIVE METABOLIC PANEL
ALT: 23 U/L (ref 17–63)
ANION GAP: 9 (ref 5–15)
AST: 25 U/L (ref 15–41)
Albumin: 4 g/dL (ref 3.5–5.0)
Alkaline Phosphatase: 53 U/L (ref 38–126)
BILIRUBIN TOTAL: 0.5 mg/dL (ref 0.3–1.2)
BUN: 32 mg/dL — AB (ref 6–20)
CO2: 20 mmol/L — ABNORMAL LOW (ref 22–32)
Calcium: 8.8 mg/dL — ABNORMAL LOW (ref 8.9–10.3)
Chloride: 106 mmol/L (ref 101–111)
Creatinine, Ser: 1.32 mg/dL — ABNORMAL HIGH (ref 0.61–1.24)
GFR, EST NON AFRICAN AMERICAN: 56 mL/min — AB (ref >60)
Glucose, Bld: 154 mg/dL — ABNORMAL HIGH (ref 65–99)
POTASSIUM: 4.5 mmol/L (ref 3.5–5.1)
Sodium: 135 mmol/L (ref 135–145)
TOTAL PROTEIN: 6.2 g/dL — AB (ref 6.5–8.1)

## 2017-01-09 LAB — GLUCOSE, CAPILLARY: Glucose-Capillary: 116 mg/dL — ABNORMAL HIGH (ref 65–99)

## 2017-01-09 LAB — CBC
HEMATOCRIT: 34.7 % — AB (ref 39.0–52.0)
HEMOGLOBIN: 11.5 g/dL — AB (ref 13.0–17.0)
MCH: 29.1 pg (ref 26.0–34.0)
MCHC: 33.1 g/dL (ref 30.0–36.0)
MCV: 87.8 fL (ref 78.0–100.0)
Platelets: 210 10*3/uL (ref 150–400)
RBC: 3.95 MIL/uL — ABNORMAL LOW (ref 4.22–5.81)
RDW: 14 % (ref 11.5–15.5)
WBC: 15.3 10*3/uL — AB (ref 4.0–10.5)

## 2017-01-09 LAB — URINALYSIS, ROUTINE W REFLEX MICROSCOPIC
BILIRUBIN URINE: NEGATIVE
Glucose, UA: 50 mg/dL — AB
Hgb urine dipstick: NEGATIVE
Ketones, ur: NEGATIVE mg/dL
Leukocytes, UA: NEGATIVE
NITRITE: NEGATIVE
Protein, ur: NEGATIVE mg/dL
SPECIFIC GRAVITY, URINE: 1.031 — AB (ref 1.005–1.030)
pH: 5 (ref 5.0–8.0)

## 2017-01-09 LAB — TYPE AND SCREEN
ABO/RH(D): O POS
ANTIBODY SCREEN: NEGATIVE

## 2017-01-09 LAB — I-STAT CHEM 8, ED
BUN: 33 mg/dL — AB (ref 6–20)
CALCIUM ION: 1.11 mmol/L — AB (ref 1.15–1.40)
CREATININE: 1.3 mg/dL — AB (ref 0.61–1.24)
Chloride: 106 mmol/L (ref 101–111)
GLUCOSE: 153 mg/dL — AB (ref 65–99)
HCT: 35 % — ABNORMAL LOW (ref 39.0–52.0)
Hemoglobin: 11.9 g/dL — ABNORMAL LOW (ref 13.0–17.0)
Potassium: 4.5 mmol/L (ref 3.5–5.1)
Sodium: 137 mmol/L (ref 135–145)
TCO2: 24 mmol/L (ref 0–100)

## 2017-01-09 LAB — I-STAT CG4 LACTIC ACID, ED: LACTIC ACID, VENOUS: 1.43 mmol/L (ref 0.5–1.9)

## 2017-01-09 LAB — ETHANOL: Alcohol, Ethyl (B): <5 mg/dL (ref <5)

## 2017-01-09 LAB — PROTIME-INR
INR: 1.03
PROTHROMBIN TIME: 13.5 s (ref 11.4–15.2)

## 2017-01-09 LAB — ABO/RH: ABO/RH(D): O POS

## 2017-01-09 SURGERY — INSERTION, INTRAMEDULLARY ROD, FEMUR
Anesthesia: General | Site: Hip | Laterality: Left

## 2017-01-09 MED ORDER — HYDROMORPHONE HCL 1 MG/ML IJ SOLN
1.0000 mg | Freq: Once | INTRAMUSCULAR | Status: AC
  Administered 2017-01-09: 1 mg via INTRAVENOUS

## 2017-01-09 MED ORDER — DILTIAZEM HCL ER COATED BEADS 180 MG PO CP24
180.0000 mg | ORAL_CAPSULE | Freq: Every day | ORAL | Status: DC
  Administered 2017-01-10 – 2017-01-12 (×3): 180 mg via ORAL
  Filled 2017-01-09 (×3): qty 1

## 2017-01-09 MED ORDER — HYDROMORPHONE HCL 1 MG/ML IJ SOLN
0.2500 mg | INTRAMUSCULAR | Status: DC | PRN
Start: 2017-01-09 — End: 2017-01-09
  Administered 2017-01-09 (×3): 0.5 mg via INTRAVENOUS

## 2017-01-09 MED ORDER — ONDANSETRON HCL 4 MG PO TABS
4.0000 mg | ORAL_TABLET | Freq: Four times a day (QID) | ORAL | Status: DC | PRN
  Administered 2017-01-11: 4 mg via ORAL
  Filled 2017-01-09: qty 1

## 2017-01-09 MED ORDER — IOPAMIDOL (ISOVUE-300) INJECTION 61%
INTRAVENOUS | Status: AC
  Administered 2017-01-09: 13:00:00
  Filled 2017-01-09: qty 100

## 2017-01-09 MED ORDER — ACETAMINOPHEN 650 MG RE SUPP: 650.0000 mg | Freq: Four times a day (QID) | RECTAL | Status: DC | PRN

## 2017-01-09 MED ORDER — METHOCARBAMOL 500 MG PO TABS
500.0000 mg | ORAL_TABLET | Freq: Four times a day (QID) | ORAL | Status: DC | PRN
  Administered 2017-01-10 (×2): 500 mg via ORAL
  Filled 2017-01-09 (×2): qty 1

## 2017-01-09 MED ORDER — SUGAMMADEX SODIUM 200 MG/2ML IV SOLN
INTRAVENOUS | Status: DC | PRN
  Administered 2017-01-09: 200 mg via INTRAVENOUS

## 2017-01-09 MED ORDER — ONDANSETRON HCL 4 MG/2ML IJ SOLN: 4.0000 mg | Freq: Four times a day (QID) | INTRAMUSCULAR | Status: DC | PRN

## 2017-01-09 MED ORDER — MENTHOL 3 MG MT LOZG: 1.0000 | LOZENGE | OROMUCOSAL | Status: DC | PRN

## 2017-01-09 MED ORDER — CEFAZOLIN SODIUM-DEXTROSE 2-4 GM/100ML-% IV SOLN
2.0000 g | Freq: Four times a day (QID) | INTRAVENOUS | Status: AC
  Administered 2017-01-10 (×2): 2 g via INTRAVENOUS
  Filled 2017-01-09 (×2): qty 100

## 2017-01-09 MED ORDER — MORPHINE SULFATE (PF) 2 MG/ML IV SOLN: 1.0000 mg | INTRAVENOUS | Status: DC | PRN

## 2017-01-09 MED ORDER — BUDESONIDE-FORMOTEROL FUMARATE 80-4.5 MCG/ACT IN AERO
2.0000 | INHALATION_SPRAY | Freq: Two times a day (BID) | RESPIRATORY_TRACT | Status: DC
  Filled 2017-01-09: qty 6.9

## 2017-01-09 MED ORDER — METOCLOPRAMIDE HCL 5 MG/ML IJ SOLN: 5.0000 mg | Freq: Three times a day (TID) | INTRAMUSCULAR | Status: DC | PRN

## 2017-01-09 MED ORDER — MOMETASONE FURO-FORMOTEROL FUM 100-5 MCG/ACT IN AERO
2.0000 | INHALATION_SPRAY | Freq: Two times a day (BID) | RESPIRATORY_TRACT | Status: DC
  Administered 2017-01-09 – 2017-01-12 (×6): 2 via RESPIRATORY_TRACT
  Filled 2017-01-09: qty 8.8

## 2017-01-09 MED ORDER — HYDROMORPHONE HCL 1 MG/ML IJ SOLN
INTRAMUSCULAR | Status: AC
  Filled 2017-01-09: qty 1

## 2017-01-09 MED ORDER — ACETAMINOPHEN 325 MG PO TABS: 650.0000 mg | ORAL_TABLET | Freq: Four times a day (QID) | ORAL | Status: DC | PRN

## 2017-01-09 MED ORDER — FENTANYL CITRATE (PF) 250 MCG/5ML IJ SOLN
INTRAMUSCULAR | Status: DC | PRN
  Administered 2017-01-09: 150 ug via INTRAVENOUS
  Administered 2017-01-09 (×2): 50 ug via INTRAVENOUS

## 2017-01-09 MED ORDER — SODIUM CHLORIDE 0.9 % IV BOLUS (SEPSIS)
500.0000 mL | Freq: Once | INTRAVENOUS | Status: AC
  Administered 2017-01-09: 500 mL via INTRAVENOUS

## 2017-01-09 MED ORDER — HYDROMORPHONE HCL 1 MG/ML IJ SOLN
1.0000 mg | INTRAMUSCULAR | Status: DC | PRN
  Administered 2017-01-10 – 2017-01-11 (×3): 1 mg via INTRAVENOUS
  Filled 2017-01-09 (×3): qty 1

## 2017-01-09 MED ORDER — PHENOL 1.4 % MT LIQD
1.0000 | OROMUCOSAL | Status: DC | PRN
Start: 2017-01-09 — End: 2017-01-12

## 2017-01-09 MED ORDER — FENTANYL CITRATE (PF) 250 MCG/5ML IJ SOLN
INTRAMUSCULAR | Status: AC
  Filled 2017-01-09: qty 5

## 2017-01-09 MED ORDER — MORPHINE SULFATE (PF) 4 MG/ML IV SOLN
INTRAVENOUS | Status: AC
  Filled 2017-01-09: qty 1

## 2017-01-09 MED ORDER — ONDANSETRON HCL 4 MG/2ML IJ SOLN: 4.0000 mg | Freq: Once | INTRAMUSCULAR | Status: DC | PRN

## 2017-01-09 MED ORDER — ONDANSETRON HCL 4 MG/2ML IJ SOLN
4.0000 mg | Freq: Four times a day (QID) | INTRAMUSCULAR | Status: DC | PRN
  Administered 2017-01-11: 4 mg via INTRAVENOUS
  Filled 2017-01-09: qty 2

## 2017-01-09 MED ORDER — CLONAZEPAM 1 MG PO TABS
1.0000 mg | ORAL_TABLET | Freq: Every day | ORAL | Status: DC
  Administered 2017-01-09 – 2017-01-11 (×3): 1 mg via ORAL
  Filled 2017-01-09 (×3): qty 1

## 2017-01-09 MED ORDER — SUCCINYLCHOLINE CHLORIDE 20 MG/ML IJ SOLN
INTRAMUSCULAR | Status: DC | PRN
  Administered 2017-01-09: 120 mg via INTRAVENOUS

## 2017-01-09 MED ORDER — BUSPIRONE HCL 5 MG PO TABS
2.5000 mg | ORAL_TABLET | Freq: Two times a day (BID) | ORAL | Status: DC
  Administered 2017-01-09 – 2017-01-12 (×6): 2.5 mg via ORAL
  Filled 2017-01-09 (×6): qty 1

## 2017-01-09 MED ORDER — HYDROMORPHONE HCL 1 MG/ML IJ SOLN
INTRAMUSCULAR | Status: AC
  Administered 2017-01-09: 0.5 mg via INTRAVENOUS
  Filled 2017-01-09: qty 1

## 2017-01-09 MED ORDER — OXYCODONE HCL 5 MG PO TABS
5.0000 mg | ORAL_TABLET | ORAL | Status: DC | PRN
  Administered 2017-01-10: 10 mg via ORAL
  Filled 2017-01-09: qty 2

## 2017-01-09 MED ORDER — PROPOFOL 10 MG/ML IV BOLUS
INTRAVENOUS | Status: AC
  Filled 2017-01-09: qty 40

## 2017-01-09 MED ORDER — MORPHINE SULFATE (PF) 4 MG/ML IV SOLN
4.0000 mg | Freq: Once | INTRAVENOUS | Status: AC
  Administered 2017-01-09: 4 mg via INTRAVENOUS

## 2017-01-09 MED ORDER — 0.9 % SODIUM CHLORIDE (POUR BTL) OPTIME
TOPICAL | Status: DC | PRN
  Administered 2017-01-09: 1000 mL

## 2017-01-09 MED ORDER — METOCLOPRAMIDE HCL 5 MG PO TABS: 5.0000 mg | ORAL_TABLET | Freq: Three times a day (TID) | ORAL | Status: DC | PRN

## 2017-01-09 MED ORDER — MIDAZOLAM HCL 2 MG/2ML IJ SOLN
INTRAMUSCULAR | Status: AC
  Filled 2017-01-09: qty 2

## 2017-01-09 MED ORDER — MEPERIDINE HCL 25 MG/ML IJ SOLN: 6.2500 mg | INTRAMUSCULAR | Status: DC | PRN

## 2017-01-09 MED ORDER — PROPOFOL 10 MG/ML IV BOLUS
INTRAVENOUS | Status: DC | PRN
  Administered 2017-01-09: 140 mg via INTRAVENOUS

## 2017-01-09 MED ORDER — BISACODYL 10 MG RE SUPP: 10.0000 mg | Freq: Every day | RECTAL | Status: DC | PRN

## 2017-01-09 MED ORDER — METHOCARBAMOL 1000 MG/10ML IJ SOLN
500.0000 mg | Freq: Four times a day (QID) | INTRAVENOUS | Status: DC | PRN
  Filled 2017-01-09: qty 5

## 2017-01-09 MED ORDER — LACTATED RINGERS IV SOLN
INTRAVENOUS | Status: DC | PRN
  Administered 2017-01-09: 17:00:00 via INTRAVENOUS

## 2017-01-09 MED ORDER — DOCUSATE SODIUM 100 MG PO CAPS: 100.0000 mg | ORAL_CAPSULE | Freq: Two times a day (BID) | ORAL | Status: DC

## 2017-01-09 MED ORDER — GABAPENTIN 300 MG PO CAPS
300.0000 mg | ORAL_CAPSULE | Freq: Every day | ORAL | Status: DC
  Administered 2017-01-09 – 2017-01-11 (×3): 300 mg via ORAL
  Filled 2017-01-09 (×3): qty 1

## 2017-01-09 MED ORDER — ONDANSETRON HCL 4 MG PO TABS: 4.0000 mg | ORAL_TABLET | Freq: Four times a day (QID) | ORAL | Status: DC | PRN

## 2017-01-09 MED ORDER — HYDROCODONE-ACETAMINOPHEN 5-325 MG PO TABS
1.0000 | ORAL_TABLET | Freq: Four times a day (QID) | ORAL | Status: DC | PRN
  Administered 2017-01-10 (×3): 2 via ORAL
  Filled 2017-01-09 (×3): qty 2

## 2017-01-09 MED ORDER — ONDANSETRON HCL 4 MG/2ML IJ SOLN
INTRAMUSCULAR | Status: DC | PRN
  Administered 2017-01-09: 4 mg via INTRAVENOUS

## 2017-01-09 MED ORDER — EPHEDRINE SULFATE 50 MG/ML IJ SOLN
INTRAMUSCULAR | Status: DC | PRN
  Administered 2017-01-09: 5 mg via INTRAVENOUS

## 2017-01-09 MED ORDER — IRBESARTAN 75 MG PO TABS
37.5000 mg | ORAL_TABLET | Freq: Every day | ORAL | Status: DC
  Administered 2017-01-10 – 2017-01-12 (×3): 37.5 mg via ORAL
  Filled 2017-01-09 (×3): qty 0.5

## 2017-01-09 MED ORDER — KCL IN DEXTROSE-NACL 20-5-0.45 MEQ/L-%-% IV SOLN
INTRAVENOUS | Status: DC
  Administered 2017-01-09 – 2017-01-11 (×3): via INTRAVENOUS
  Filled 2017-01-09 (×3): qty 1000

## 2017-01-09 MED ORDER — MIDAZOLAM HCL 5 MG/5ML IJ SOLN
INTRAMUSCULAR | Status: DC | PRN
  Administered 2017-01-09: 2 mg via INTRAVENOUS

## 2017-01-09 MED ORDER — ROCURONIUM BROMIDE 100 MG/10ML IV SOLN
INTRAVENOUS | Status: DC | PRN
  Administered 2017-01-09: 20 mg via INTRAVENOUS

## 2017-01-09 MED ORDER — SODIUM CHLORIDE 0.9 % IV SOLN: 3.0000 g | Freq: Once | INTRAVENOUS | Status: DC

## 2017-01-09 MED ORDER — ASPIRIN EC 325 MG PO TBEC
325.0000 mg | DELAYED_RELEASE_TABLET | Freq: Every day | ORAL | Status: DC
  Administered 2017-01-10 – 2017-01-12 (×3): 325 mg via ORAL
  Filled 2017-01-09 (×3): qty 1

## 2017-01-09 MED ORDER — ENOXAPARIN SODIUM 40 MG/0.4ML ~~LOC~~ SOLN
40.0000 mg | SUBCUTANEOUS | Status: DC
  Filled 2017-01-09: qty 0.4

## 2017-01-09 MED ORDER — DOCUSATE SODIUM 100 MG PO CAPS
100.0000 mg | ORAL_CAPSULE | Freq: Two times a day (BID) | ORAL | Status: DC
  Administered 2017-01-09 – 2017-01-12 (×6): 100 mg via ORAL
  Filled 2017-01-09 (×6): qty 1

## 2017-01-09 MED ORDER — ACETAMINOPHEN 325 MG PO TABS: 650.0000 mg | ORAL_TABLET | ORAL | Status: DC | PRN

## 2017-01-09 MED ORDER — LIDOCAINE HCL (CARDIAC) 20 MG/ML IV SOLN
INTRAVENOUS | Status: DC | PRN
  Administered 2017-01-09: 100 mg via INTRATRACHEAL

## 2017-01-09 MED ORDER — CEFAZOLIN SODIUM-DEXTROSE 2-3 GM-% IV SOLR
INTRAVENOUS | Status: DC | PRN
  Administered 2017-01-09: 2 g via INTRAVENOUS

## 2017-01-09 MED ORDER — OXYCODONE-ACETAMINOPHEN 5-325 MG PO TABS: 1.0000 | ORAL_TABLET | Freq: Two times a day (BID) | ORAL | Status: DC | PRN

## 2017-01-09 MED ORDER — OXYCODONE HCL 5 MG PO TABS: 5.0000 mg | ORAL_TABLET | ORAL | Status: DC | PRN

## 2017-01-09 SURGICAL SUPPLY — 42 items
BNDG COHESIVE 4X5 TAN NS LF (GAUZE/BANDAGES/DRESSINGS) ×3 IMPLANT
COVER PERINEAL POST (MISCELLANEOUS) ×2 IMPLANT
COVER SURGICAL LIGHT HANDLE (MISCELLANEOUS) ×2 IMPLANT
DRAPE STERI IOBAN 125X83 (DRAPES) ×2 IMPLANT
DRSG MEPILEX BORDER 4X4 (GAUZE/BANDAGES/DRESSINGS) ×1 IMPLANT
DRSG MEPILEX BORDER 4X8 (GAUZE/BANDAGES/DRESSINGS) ×2 IMPLANT
DRSG PAD ABDOMINAL 8X10 ST (GAUZE/BANDAGES/DRESSINGS) ×2 IMPLANT
DURAPREP 26ML APPLICATOR (WOUND CARE) ×2 IMPLANT
ELECT REM PT RETURN 9FT ADLT (ELECTROSURGICAL) ×2
ELECTRODE REM PT RTRN 9FT ADLT (ELECTROSURGICAL) ×1 IMPLANT
FACESHIELD WRAPAROUND (MASK) IMPLANT
FACESHIELD WRAPAROUND OR TEAM (MASK) ×1 IMPLANT
GAUZE XEROFORM 5X9 LF (GAUZE/BANDAGES/DRESSINGS) ×2 IMPLANT
GLOVE BIO SURGEON STRL SZ8 (GLOVE) ×2 IMPLANT
GLOVE BIOGEL PI IND STRL 8 (GLOVE) ×1 IMPLANT
GLOVE BIOGEL PI INDICATOR 8 (GLOVE) ×1
GLOVE ORTHO TXT STRL SZ7.5 (GLOVE) ×2 IMPLANT
GOWN STRL REUS W/ TWL LRG LVL3 (GOWN DISPOSABLE) ×1 IMPLANT
GOWN STRL REUS W/ TWL XL LVL3 (GOWN DISPOSABLE) ×2 IMPLANT
GOWN STRL REUS W/TWL LRG LVL3 (GOWN DISPOSABLE) ×2
GOWN STRL REUS W/TWL XL LVL3 (GOWN DISPOSABLE) ×4
GUIDEPIN 3.2X17.5 THRD DISP (PIN) ×1 IMPLANT
GUIDEWIRE BALL NOSE 80CM (WIRE) ×1 IMPLANT
HFN LH 130 DEG 9MM X 360MM (Nail) ×1 IMPLANT
KIT BASIN OR (CUSTOM PROCEDURE TRAY) ×2 IMPLANT
KIT ROOM TURNOVER OR (KITS) ×2 IMPLANT
LINER BOOT UNIVERSAL DISP (MISCELLANEOUS) ×1 IMPLANT
MANIFOLD NEPTUNE II (INSTRUMENTS) ×1 IMPLANT
NS IRRIG 1000ML POUR BTL (IV SOLUTION) ×2 IMPLANT
PACK GENERAL/GYN (CUSTOM PROCEDURE TRAY) ×2 IMPLANT
PAD ARMBOARD 7.5X6 YLW CONV (MISCELLANEOUS) ×4 IMPLANT
PAD CAST 4YDX4 CTTN HI CHSV (CAST SUPPLIES) ×2 IMPLANT
PADDING CAST COTTON 4X4 STRL (CAST SUPPLIES)
SCREW LAG 10.5MMX105MM HFN (Screw) ×1 IMPLANT
STAPLER VISISTAT 35W (STAPLE) ×2 IMPLANT
SUT VIC AB 0 CT1 27 (SUTURE) ×2
SUT VIC AB 0 CT1 27XBRD ANBCTR (SUTURE) ×2 IMPLANT
SUT VIC AB 2-0 CT1 27 (SUTURE) ×4
SUT VIC AB 2-0 CT1 TAPERPNT 27 (SUTURE) ×2 IMPLANT
TOWEL OR 17X24 6PK STRL BLUE (TOWEL DISPOSABLE) ×2 IMPLANT
TOWEL OR 17X26 10 PK STRL BLUE (TOWEL DISPOSABLE) ×2 IMPLANT
WATER STERILE IRR 1000ML POUR (IV SOLUTION) ×2 IMPLANT

## 2017-01-09 NOTE — H&P (Signed)
History   Kevin Bonilla is an 66 y.o. male.   Chief Complaint:  Chief Complaint  Patient presents with  . Trauma    Pt is a 66 yo M who presents as a level 2 trauma after a fall from a horse.  He has had multiple falls over the last few years.  He used to ride horses quite a bit and decided to start again.  He thinks the saddle might not have been tight.  He also had trouble getting his steel toe boots into the stirrups and had to go change into his cowboy boots.  He denies LOC or syncope.  He did strike his head on the way down.  He struck his right chest as well.  After landing on the ground it was noted that his left leg was externally rotated and shortened.  His friend offered to straighten it out, but he declined.  He complains now of severe left hip and and moderate right chest pain with some shortness of breath.  He denies nausea or vomiting.  He has no visual changes, dizziness, or headache.  He was not wearing a helmet.        Past Medical History:  Diagnosis Date  . Asthma   . Diabetes mellitus without complication ([place])   . Hypertension     History reviewed. No pertinent surgical history.  History reviewed. No pertinent family history. Social History:  has no tobacco, alcohol, and drug history on file.  Allergies   Allergies  Allergen Reactions  . Lisinopril Nausea Only and Other (See Comments)    Panic attacks    Home Medications   Medications Prior to Admission  Medication Sig Dispense Refill  . Budesonide-Formoterol Fumarate (SYMBICORT IN) Inhale 2 puffs into the lungs 2 (two) times daily.    . BUSPIRONE HCL PO Take 0.5 tablets by mouth 2 (two) times daily.    . clonazePAM (KLONOPIN) 1 MG tablet Take 1 mg by mouth at bedtime.    . Cyanocobalamin (VITAMIN B-12 PO) Take 1 tablet by mouth daily.    . DilTIAZem HCl Coated Beads (DILTIAZEM CD PO) Take 2 capsules by mouth daily.    Marland Kitchen GABAPENTIN PO Take 2 capsules by mouth at bedtime.    Marland Kitchen ibuprofen  (ADVIL,MOTRIN) 200 MG tablet Take 200-400 mg by mouth every 6 (six) hours as needed for headache (pain).    . METFORMIN HCL PO Take 1 tablet by mouth 2 (two) times daily.    Marland Kitchen oxyCODONE-acetaminophen (PERCOCET/ROXICET) 5-325 MG tablet Take 1 tablet by mouth 2 (two) times daily as needed (pain).    Marland Kitchen PREDNISONE PO Take 1-3 tablets by mouth See admin instructions. Tapered course (unknown start date) - take 3 tablets by mouth today (01/09/17) at bedtime, then take 2 tablets daily at bedtime for 3 days, then take 1 tablet daily at bedtime for 3 days    . PRESCRIPTION MEDICATION Take 1 capsule by mouth 2 (two) times daily. Light green capsule    . VALSARTAN PO Take 1 tablet by mouth daily.      Trauma Course   Results for orders placed or performed during the hospital encounter of 01/09/17 (from the past 48 hour(s))  Comprehensive metabolic panel     Status: Abnormal   Collection Time: 01/09/17 12:56 PM  Result Value Ref Range   Sodium 135 135 - 145 mmol/L   Potassium 4.5 3.5 - 5.1 mmol/L   Chloride 106 101 - 111 mmol/L   CO2  20 (L) 22 - 32 mmol/L   Glucose, Bld 154 (H) 65 - 99 mg/dL   BUN 32 (H) 6 - 20 mg/dL   Creatinine, Ser 1.32 (H) 0.61 - 1.24 mg/dL   Calcium 8.8 (L) 8.9 - 10.3 mg/dL   Total Protein 6.2 (L) 6.5 - 8.1 g/dL   Albumin 4.0 3.5 - 5.0 g/dL   AST 25 15 - 41 U/L   ALT 23 17 - 63 U/L   Alkaline Phosphatase 53 38 - 126 U/L   Total Bilirubin 0.5 0.3 - 1.2 mg/dL   GFR calc non Af Amer 56 (L) >60 mL/min   GFR calc Af Amer >60 >60 mL/min    Comment: (NOTE) The eGFR has been calculated using the CKD EPI equation. This calculation has not been validated in all clinical situations. eGFR's persistently <60 mL/min signify possible Chronic Kidney Disease.    Anion gap 9 5 - 15  CBC     Status: Abnormal   Collection Time: 01/09/17 12:56 PM  Result Value Ref Range   WBC 15.3 (H) 4.0 - 10.5 K/uL   RBC 3.95 (L) 4.22 - 5.81 MIL/uL   Hemoglobin 11.5 (L) 13.0 - 17.0 g/dL   HCT 34.7 (L)  39.0 - 52.0 %   MCV 87.8 78.0 - 100.0 fL   MCH 29.1 26.0 - 34.0 pg   MCHC 33.1 30.0 - 36.0 g/dL   RDW 14.0 11.5 - 15.5 %   Platelets 210 150 - 400 K/uL  Ethanol     Status: None   Collection Time: 01/09/17 12:56 PM  Result Value Ref Range   Alcohol, Ethyl (B) <5 <5 mg/dL    Comment:        LOWEST DETECTABLE LIMIT FOR SERUM ALCOHOL IS 5 mg/dL FOR MEDICAL PURPOSES ONLY   Protime-INR     Status: None   Collection Time: 01/09/17 12:56 PM  Result Value Ref Range   Prothrombin Time 13.5 11.4 - 15.2 seconds   INR 1.03   Type and screen [place]     Status: None   Collection Time: 01/09/17 12:59 PM  Result Value Ref Range   ABO/RH(D) O POS    Antibody Screen NEG    Sample Expiration 01/12/2017   ABO/Rh     Status: None   Collection Time: 01/09/17 12:59 PM  Result Value Ref Range   ABO/RH(D) O POS   I-Stat Chem 8, ED     Status: Abnormal   Collection Time: 01/09/17  1:03 PM  Result Value Ref Range   Sodium 137 135 - 145 mmol/L   Potassium 4.5 3.5 - 5.1 mmol/L   Chloride 106 101 - 111 mmol/L   BUN 33 (H) 6 - 20 mg/dL   Creatinine, Ser 1.30 (H) 0.61 - 1.24 mg/dL   Glucose, Bld 153 (H) 65 - 99 mg/dL   Calcium, Ion 1.11 (L) 1.15 - 1.40 mmol/L   TCO2 24 0 - 100 mmol/L   Hemoglobin 11.9 (L) 13.0 - 17.0 g/dL   HCT 35.0 (L) 39.0 - 52.0 %  I-Stat CG4 Lactic Acid, ED     Status: None   Collection Time: 01/09/17  1:03 PM  Result Value Ref Range   Lactic Acid, Venous 1.43 0.5 - 1.9 mmol/L   Ct Head Wo Contrast  Result Date: 01/09/2017 CLINICAL DATA:  [place] off horse today, neck pain, right hip pain EXAM: CT HEAD WITHOUT CONTRAST CT CERVICAL SPINE WITHOUT CONTRAST TECHNIQUE: Multidetector CT imaging of the head  and cervical spine was performed following the standard protocol without intravenous contrast. Multiplanar CT image reconstructions of the cervical spine were also generated. COMPARISON:  None. FINDINGS: CT HEAD FINDINGS Brain: No intracranial hemorrhage, mass  effect or midline shift. Mild cerebral atrophy. No acute cortical infarction. No mass lesion is noted on this unenhanced scan. Vascular: Atherosclerotic calcifications of carotid siphon. Atherosclerotic calcifications bilateral vertebral arteries. Skull: No skull fracture is noted. Sinuses/Orbits: No acute findings Other: None CT CERVICAL SPINE FINDINGS Alignment: There is mild anterolisthesis about 2.5 mm C4 on C5 vertebral body. Otherwise normal alignment. Skull base and vertebrae: Degenerative changes are noted C1-C2 articulation. Mild anterior spurring lower endplate of C2. Minimal anterior spurring lower endplate of C4. Mild to moderate anterior spurring upper and lower endplate of C5 and C6 vertebral body. Moderate anterior spurring upper endplate of C7 vertebral body. Mild posterior spurring lower endplate of C6 vertebral body. No acute fracture is noted. Soft tissues and spinal canal: No prevertebral soft tissue swelling. No definite spinal canal hematoma. Disc levels: There is mild disc space flattening at C4-C5 level. Mild disc space flattening at C4-C5 level. Moderate disc space flattening with some vacuum disc phenomenon at C6-C7 level. Minimal disc space flattening at C7 T1 level. Upper chest:  Lung apices are not visualized. Other: None IMPRESSION: 1. No acute intracranial abnormality.  Mild cerebral atrophy. 2. No cervical spine acute fracture. There is about 2.5 mm anterolisthesis C4 on C5 vertebral body. Multilevel mild degenerative changes as described above. No prevertebral soft tissue swelling. Cervical airway is patent. Electronically Signed   By: Lahoma Crocker M.D.   On: 01/09/2017 15:36   Ct Chest W Contrast  Result Date: 01/09/2017 CLINICAL DATA:  66 year old male with acute chest, abdominal and pelvic pain following fall off horse today. Initial encounter. EXAM: CT CHEST, ABDOMEN, AND PELVIS WITH CONTRAST TECHNIQUE: Multidetector CT imaging of the chest, abdomen and pelvis was performed  following the standard protocol during bolus administration of intravenous contrast. CONTRAST:  100 cc intravenous Isovue-300 COMPARISON:  01/09/2017 chest and pelvis radiographs. FINDINGS: CT CHEST FINDINGS Cardiovascular: Cardiomegaly and moderate coronary artery calcifications noted. Aortic valvular calcifications are present. There is no evidence of thoracic aortic aneurysm. No pericardial effusion identified. Mediastinum/Nodes: No mediastinal hematoma or enlarged lymph nodes. Lungs/Pleura: A contusion within the lateral right upper lobe noted. A small right pleural effusion is present. There is no evidence of pneumothorax. A 1 cm left lower lobe nodule (image 36) is present. No other pulmonary abnormalities identified. Musculoskeletal: Fractures of the lateral right 1st-8th ribs noted. CT ABDOMEN PELVIS FINDINGS Hepatobiliary: A cyst within the central liver is identified without other hepatic abnormality. The gallbladder is unremarkable. There is no evidence of biliary dilatation. Pancreas: Unremarkable Spleen: Unremarkable Adrenals/Urinary Tract: The kidneys, adrenal glands and bladder are unremarkable except for small right renal cyst. Stomach/Bowel: Stomach is within normal limits. Appendix appears normal. No evidence of bowel wall thickening, distention, or inflammatory changes. Vascular/Lymphatic: No significant vascular findings are present. No enlarged abdominal or pelvic lymph nodes. Reproductive: Prostate unremarkable Other: No free fluid, focal collection or pneumoperitoneum. Musculoskeletal: An intertrochanteric fracture of the left femur with varus angulation is again identified. There appears to be a small hematoma just below the right inferior pubic ramus and may represent an avulsion injury. IMPRESSION: Fractures of the lateral right 1st-8th ribs with right upper lobe pulmonary contusion and small right pleural effusion. No evidence of pneumothorax. No evidence of acute injury within the  abdomen. Left femoral intertrochanteric  fracture with varus angulation. Question small hematoma just below the right inferior pubic ramus which may represent an avulsion injury. 1 cm left lower lobe pulmonary nodule. Consider one of the following in 3 months for both low-risk and high-risk individuals: (a) repeat chest CT, (b) follow-up PET-CT, or (c) tissue sampling. This recommendation follows the consensus statement: Guidelines for Management of Incidental Pulmonary Nodules Detected on CT Images: From the Fleischner Society 2017; Radiology 2017; 284:228-243. Electronically Signed   By: Margarette Canada M.D.   On: 01/09/2017 15:48   Ct Cervical Spine Wo Contrast  Result Date: 01/09/2017 CLINICAL DATA:  [place] today, neck pain, right hip pain EXAM: CT HEAD WITHOUT CONTRAST CT CERVICAL SPINE WITHOUT CONTRAST TECHNIQUE: Multidetector CT imaging of the head and cervical spine was performed following the standard protocol without intravenous contrast. Multiplanar CT image reconstructions of the cervical spine were also generated. COMPARISON:  None. FINDINGS: CT HEAD FINDINGS Brain: No intracranial hemorrhage, mass effect or midline shift. Mild cerebral atrophy. No acute cortical infarction. No mass lesion is noted on this unenhanced scan. Vascular: Atherosclerotic calcifications of carotid siphon. Atherosclerotic calcifications bilateral vertebral arteries. Skull: No skull fracture is noted. Sinuses/Orbits: No acute findings Other: None CT CERVICAL SPINE FINDINGS Alignment: There is mild anterolisthesis about 2.5 mm C4 on C5 vertebral body. Otherwise normal alignment. Skull base and vertebrae: Degenerative changes are noted C1-C2 articulation. Mild anterior spurring lower endplate of C2. Minimal anterior spurring lower endplate of C4. Mild to moderate anterior spurring upper and lower endplate of C5 and C6 vertebral body. Moderate anterior spurring upper endplate of C7 vertebral body. Mild posterior spurring  lower endplate of C6 vertebral body. No acute fracture is noted. Soft tissues and spinal canal: No prevertebral soft tissue swelling. No definite spinal canal hematoma. Disc levels: There is mild disc space flattening at C4-C5 level. Mild disc space flattening at C4-C5 level. Moderate disc space flattening with some vacuum disc phenomenon at C6-C7 level. Minimal disc space flattening at C7 T1 level. Upper chest:  Lung apices are not visualized. Other: None IMPRESSION: 1. No acute intracranial abnormality.  Mild cerebral atrophy. 2. No cervical spine acute fracture. There is about 2.5 mm anterolisthesis C4 on C5 vertebral body. Multilevel mild degenerative changes as described above. No prevertebral soft tissue swelling. Cervical airway is patent. Electronically Signed   By: Lahoma Crocker M.D.   On: 01/09/2017 15:36   Ct Abdomen Pelvis W Contrast  Result Date: 01/09/2017 CLINICAL DATA:  66 year old male with acute chest, abdominal and pelvic pain following fall off horse today. Initial encounter. EXAM: CT CHEST, ABDOMEN, AND PELVIS WITH CONTRAST TECHNIQUE: Multidetector CT imaging of the chest, abdomen and pelvis was performed following the standard protocol during bolus administration of intravenous contrast. CONTRAST:  100 cc intravenous Isovue-300 COMPARISON:  01/09/2017 chest and pelvis radiographs. FINDINGS: CT CHEST FINDINGS Cardiovascular: Cardiomegaly and moderate coronary artery calcifications noted. Aortic valvular calcifications are present. There is no evidence of thoracic aortic aneurysm. No pericardial effusion identified. Mediastinum/Nodes: No mediastinal hematoma or enlarged lymph nodes. Lungs/Pleura: A contusion within the lateral right upper lobe noted. A small right pleural effusion is present. There is no evidence of pneumothorax. A 1 cm left lower lobe nodule (image 36) is present. No other pulmonary abnormalities identified. Musculoskeletal: Fractures of the lateral right 1st-8th ribs noted. CT  ABDOMEN PELVIS FINDINGS Hepatobiliary: A cyst within the central liver is identified without other hepatic abnormality. The gallbladder is unremarkable. There is no evidence of biliary dilatation. Pancreas:  Unremarkable Spleen: Unremarkable Adrenals/Urinary Tract: The kidneys, adrenal glands and bladder are unremarkable except for small right renal cyst. Stomach/Bowel: Stomach is within normal limits. Appendix appears normal. No evidence of bowel wall thickening, distention, or inflammatory changes. Vascular/Lymphatic: No significant vascular findings are present. No enlarged abdominal or pelvic lymph nodes. Reproductive: Prostate unremarkable Other: No free fluid, focal collection or pneumoperitoneum. Musculoskeletal: An intertrochanteric fracture of the left femur with varus angulation is again identified. There appears to be a small hematoma just below the right inferior pubic ramus and may represent an avulsion injury. IMPRESSION: Fractures of the lateral right 1st-8th ribs with right upper lobe pulmonary contusion and small right pleural effusion. No evidence of pneumothorax. No evidence of acute injury within the abdomen. Left femoral intertrochanteric fracture with varus angulation. Question small hematoma just below the right inferior pubic ramus which may represent an avulsion injury. 1 cm left lower lobe pulmonary nodule. Consider one of the following in 3 months for both low-risk and high-risk individuals: (a) repeat chest CT, (b) follow-up PET-CT, or (c) tissue sampling. This recommendation follows the consensus statement: Guidelines for Management of Incidental Pulmonary Nodules Detected on CT Images: From the Fleischner Society 2017; Radiology 2017; 284:228-243. Electronically Signed   By: Margarette Canada M.D.   On: 01/09/2017 15:48   Dg Pelvis Portable  Result Date: 01/09/2017 CLINICAL DATA:  Fall from horse. EXAM: PORTABLE PELVIS 1-2 VIEWS COMPARISON:  None. FINDINGS: Single AP view of the pelvis.  Femoral heads are located. Sacroiliac joints are symmetric. Comminuted intratrochanteric proximal left femur fracture with varus angulation. Vascular calcifications. IMPRESSION: Proximal left femur fracture. Electronically Signed   By: Abigail Miyamoto M.D.   On: 01/09/2017 13:53   Dg Chest Portable 1 View  Result Date: 01/09/2017 CLINICAL DATA:  Fall from horse. EXAM: PORTABLE CHEST 1 VIEW COMPARISON:  None. FINDINGS: Numerous leads and wires project over the chest. Limited, AP portable radiograph with apical lordotic positioning. Right-sided rib fracture or fractures identified superiorly and laterally. Midline trachea. Cardiomegaly accentuated by AP portable technique. Superior mediastinal soft tissue fullness is most likely due to AP portable technique and patient size. No pleural effusion or pneumothorax. No lobar consolidation. No congestive failure. No free intraperitoneal air. IMPRESSION: Right-sided rib fracture or fractures superiorly and laterally. No gross pneumothorax or other acute cardiopulmonary disease. Cardiomegaly without congestive failure. Electronically Signed   By: Abigail Miyamoto M.D.   On: 01/09/2017 13:52   Dg Femur Min 2 Views Left  Result Date: 01/09/2017 CLINICAL DATA:  Level 2 trauma, patient fell from horse Best obtainable due to patients condition EXAM: LEFT FEMUR 2 VIEWS COMPARISON:  None. FINDINGS: Acute intertrochanteric fracture of the proximal LEFT femur with varus angulation. Distal femur is normal. Atherosclerotic calcification noted. IMPRESSION: Intertrochanteric LEFT femur fracture. Electronically Signed   By: Suzy Bouchard M.D.   On: 01/09/2017 13:57    Review of Systems  Constitutional: Negative.   HENT: Negative.   Eyes: Negative.   Respiratory: Positive for shortness of breath (mild).   Cardiovascular: Positive for chest pain (right chest).  Gastrointestinal: Negative.   Genitourinary: Negative.   Musculoskeletal: Positive for joint pain (left hip).  Skin:  Negative.   Neurological: Negative.   Endo/Heme/Allergies: Negative.   Psychiatric/Behavioral: Negative.     Blood pressure (!) 165/61, pulse 62, temperature 98.3 F (36.8 C), temperature source Oral, resp. rate 16, height 5' 9" (1.753 m), weight 101.6 kg (224 lb), SpO2 98 %. Physical Exam  Constitutional: He appears well-developed and well-nourished.  He appears distressed (looks uncomfortable).  HENT:  Head: Normocephalic and atraumatic.  Right Ear: External ear normal.  Left Ear: External ear normal.  Nose: Nose normal.  Mouth/Throat: Oropharynx is clear and moist. No oropharyngeal exudate.  Eyes: Conjunctivae and EOM are normal. Pupils are equal, round, and reactive to light. Right eye exhibits no discharge. Left eye exhibits no discharge. No scleral icterus.  Neck: Normal range of motion. Neck supple. No tracheal deviation present. No thyromegaly present.  Cardiovascular: Normal rate, regular rhythm, normal heart sounds and intact distal pulses.  Exam reveals no gallop and no friction rub.   No murmur heard. Respiratory: Effort normal and breath sounds normal. No stridor. No respiratory distress. He has no wheezes. He has no rales. He exhibits tenderness (right anterolateral tenderness).  GI: Soft.  Lymphadenopathy:    He has no cervical adenopathy.  Skin: He is not diaphoretic.     Assessment/Plan Fall from horse Right rib fractures 1-8 Right pulmonary contusion Small right pleural effusion Left intertrochanteric hip fracture. Left lower lobe pulmonary nodule  Admit to stepdown given number of rib fractures and shortness of breath. Aggressive pulmonary toilet. To OR with Dr. Ninfa Linden for hip fracture ORIF Pain control PT/OT  Will need VTE prophylaxis.   Discussed risk of pneumonia with patient and wife.  Also reviewed risk of blood clot and stressed importance of moving (in accordance with weight bearing restrictions).  Will get repeat CXR in AM.     Mohit Zirbes 01/09/2017, 4:48 PM   Procedures

## 2017-01-09 NOTE — ED Notes (Signed)
CNS remains intact on left leg.

## 2017-01-09 NOTE — Consult Note (Signed)
Reason for Consult:  Left hip fracture Referring Physician: Lauretta Chester, MD/EDP  Kevin Bonilla is an 66 y.o. male.  HPI:   66 yo male who sustained a displace left hip fracture and multiple left-sided rib fractures after a fall off of his horse.  Brought to the ED for eval and treatment.  Complaing of severe left hip pain.  X-rays do confirm a proximal left femur/hip fracture and ortho is consulted.  Past Medical History:  Diagnosis Date  . Asthma   . Diabetes mellitus without complication ([place])   . Hypertension     No past surgical history on file.  No family history on file.  Social History:  has no tobacco, alcohol, and drug history on file.  Allergies:  Allergies  Allergen Reactions  . Lisinopril Nausea Only and Other (See Comments)    Panic attacks    Medications: I have reviewed the patient's current medications.  Results for orders placed or performed during the hospital encounter of 01/09/17 (from the past 48 hour(s))  Comprehensive metabolic panel     Status: Abnormal   Collection Time: 01/09/17 12:56 PM  Result Value Ref Range   Sodium 135 135 - 145 mmol/L   Potassium 4.5 3.5 - 5.1 mmol/L   Chloride 106 101 - 111 mmol/L   CO2 20 (L) 22 - 32 mmol/L   Glucose, Bld 154 (H) 65 - 99 mg/dL   BUN 32 (H) 6 - 20 mg/dL   Creatinine, Ser 1.32 (H) 0.61 - 1.24 mg/dL   Calcium 8.8 (L) 8.9 - 10.3 mg/dL   Total Protein 6.2 (L) 6.5 - 8.1 g/dL   Albumin 4.0 3.5 - 5.0 g/dL   AST 25 15 - 41 U/L   ALT 23 17 - 63 U/L   Alkaline Phosphatase 53 38 - 126 U/L   Total Bilirubin 0.5 0.3 - 1.2 mg/dL   GFR calc non Af Amer 56 (L) >60 mL/min   GFR calc Af Amer >60 >60 mL/min    Comment: (NOTE) The eGFR has been calculated using the CKD EPI equation. This calculation has not been validated in all clinical situations. eGFR's persistently <60 mL/min signify possible Chronic Kidney Disease.    Anion gap 9 5 - 15  CBC     Status: Abnormal   Collection Time: 01/09/17 12:56 PM   Result Value Ref Range   WBC 15.3 (H) 4.0 - 10.5 K/uL   RBC 3.95 (L) 4.22 - 5.81 MIL/uL   Hemoglobin 11.5 (L) 13.0 - 17.0 g/dL   HCT 34.7 (L) 39.0 - 52.0 %   MCV 87.8 78.0 - 100.0 fL   MCH 29.1 26.0 - 34.0 pg   MCHC 33.1 30.0 - 36.0 g/dL   RDW 14.0 11.5 - 15.5 %   Platelets 210 150 - 400 K/uL  Ethanol     Status: None   Collection Time: 01/09/17 12:56 PM  Result Value Ref Range   Alcohol, Ethyl (B) <5 <5 mg/dL    Comment:        LOWEST DETECTABLE LIMIT FOR SERUM ALCOHOL IS 5 mg/dL FOR MEDICAL PURPOSES ONLY   Protime-INR     Status: None   Collection Time: 01/09/17 12:56 PM  Result Value Ref Range   Prothrombin Time 13.5 11.4 - 15.2 seconds   INR 1.03   Type and screen [place]     Status: None   Collection Time: 01/09/17 12:59 PM  Result Value Ref Range   ABO/RH(D) O POS  Antibody Screen NEG    Sample Expiration 01/12/2017   ABO/Rh     Status: None   Collection Time: 01/09/17 12:59 PM  Result Value Ref Range   ABO/RH(D) O POS   I-Stat Chem 8, ED     Status: Abnormal   Collection Time: 01/09/17  1:03 PM  Result Value Ref Range   Sodium 137 135 - 145 mmol/L   Potassium 4.5 3.5 - 5.1 mmol/L   Chloride 106 101 - 111 mmol/L   BUN 33 (H) 6 - 20 mg/dL   Creatinine, Ser 1.30 (H) 0.61 - 1.24 mg/dL   Glucose, Bld 153 (H) 65 - 99 mg/dL   Calcium, Ion 1.11 (L) 1.15 - 1.40 mmol/L   TCO2 24 0 - 100 mmol/L   Hemoglobin 11.9 (L) 13.0 - 17.0 g/dL   HCT 35.0 (L) 39.0 - 52.0 %  I-Stat CG4 Lactic Acid, ED     Status: None   Collection Time: 01/09/17  1:03 PM  Result Value Ref Range   Lactic Acid, Venous 1.43 0.5 - 1.9 mmol/L    Ct Head Wo Contrast  Result Date: 01/09/2017 CLINICAL DATA:  [place] off horse today, neck pain, right hip pain EXAM: CT HEAD WITHOUT CONTRAST CT CERVICAL SPINE WITHOUT CONTRAST TECHNIQUE: Multidetector CT imaging of the head and cervical spine was performed following the standard protocol without intravenous contrast. Multiplanar CT  image reconstructions of the cervical spine were also generated. COMPARISON:  None. FINDINGS: CT HEAD FINDINGS Brain: No intracranial hemorrhage, mass effect or midline shift. Mild cerebral atrophy. No acute cortical infarction. No mass lesion is noted on this unenhanced scan. Vascular: Atherosclerotic calcifications of carotid siphon. Atherosclerotic calcifications bilateral vertebral arteries. Skull: No skull fracture is noted. Sinuses/Orbits: No acute findings Other: None CT CERVICAL SPINE FINDINGS Alignment: There is mild anterolisthesis about 2.5 mm C4 on C5 vertebral body. Otherwise normal alignment. Skull base and vertebrae: Degenerative changes are noted C1-C2 articulation. Mild anterior spurring lower endplate of C2. Minimal anterior spurring lower endplate of C4. Mild to moderate anterior spurring upper and lower endplate of C5 and C6 vertebral body. Moderate anterior spurring upper endplate of C7 vertebral body. Mild posterior spurring lower endplate of C6 vertebral body. No acute fracture is noted. Soft tissues and spinal canal: No prevertebral soft tissue swelling. No definite spinal canal hematoma. Disc levels: There is mild disc space flattening at C4-C5 level. Mild disc space flattening at C4-C5 level. Moderate disc space flattening with some vacuum disc phenomenon at C6-C7 level. Minimal disc space flattening at C7 T1 level. Upper chest:  Lung apices are not visualized. Other: None IMPRESSION: 1. No acute intracranial abnormality.  Mild cerebral atrophy. 2. No cervical spine acute fracture. There is about 2.5 mm anterolisthesis C4 on C5 vertebral body. Multilevel mild degenerative changes as described above. No prevertebral soft tissue swelling. Cervical airway is patent. Electronically Signed   By: Lahoma Crocker M.D.   On: 01/09/2017 15:36   Ct Chest W Contrast  Result Date: 01/09/2017 CLINICAL DATA:  67 year old male with acute chest, abdominal and pelvic pain following fall off horse today.  Initial encounter. EXAM: CT CHEST, ABDOMEN, AND PELVIS WITH CONTRAST TECHNIQUE: Multidetector CT imaging of the chest, abdomen and pelvis was performed following the standard protocol during bolus administration of intravenous contrast. CONTRAST:  100 cc intravenous Isovue-300 COMPARISON:  01/09/2017 chest and pelvis radiographs. FINDINGS: CT CHEST FINDINGS Cardiovascular: Cardiomegaly and moderate coronary artery calcifications noted. Aortic valvular calcifications are present. There is no evidence of  thoracic aortic aneurysm. No pericardial effusion identified. Mediastinum/Nodes: No mediastinal hematoma or enlarged lymph nodes. Lungs/Pleura: A contusion within the lateral right upper lobe noted. A small right pleural effusion is present. There is no evidence of pneumothorax. A 1 cm left lower lobe nodule (image 36) is present. No other pulmonary abnormalities identified. Musculoskeletal: Fractures of the lateral right 1st-8th ribs noted. CT ABDOMEN PELVIS FINDINGS Hepatobiliary: A cyst within the central liver is identified without other hepatic abnormality. The gallbladder is unremarkable. There is no evidence of biliary dilatation. Pancreas: Unremarkable Spleen: Unremarkable Adrenals/Urinary Tract: The kidneys, adrenal glands and bladder are unremarkable except for small right renal cyst. Stomach/Bowel: Stomach is within normal limits. Appendix appears normal. No evidence of bowel wall thickening, distention, or inflammatory changes. Vascular/Lymphatic: No significant vascular findings are present. No enlarged abdominal or pelvic lymph nodes. Reproductive: Prostate unremarkable Other: No free fluid, focal collection or pneumoperitoneum. Musculoskeletal: An intertrochanteric fracture of the left femur with varus angulation is again identified. There appears to be a small hematoma just below the right inferior pubic ramus and may represent an avulsion injury. IMPRESSION: Fractures of the lateral right 1st-8th ribs  with right upper lobe pulmonary contusion and small right pleural effusion. No evidence of pneumothorax. No evidence of acute injury within the abdomen. Left femoral intertrochanteric fracture with varus angulation. Question small hematoma just below the right inferior pubic ramus which may represent an avulsion injury. 1 cm left lower lobe pulmonary nodule. Consider one of the following in 3 months for both low-risk and high-risk individuals: (a) repeat chest CT, (b) follow-up PET-CT, or (c) tissue sampling. This recommendation follows the consensus statement: Guidelines for Management of Incidental Pulmonary Nodules Detected on CT Images: From the Fleischner Society 2017; Radiology 2017; 284:228-243. Electronically Signed   By: Margarette Canada M.D.   On: 01/09/2017 15:48   Ct Cervical Spine Wo Contrast  Result Date: 01/09/2017 CLINICAL DATA:  [place] horse today, neck pain, right hip pain EXAM: CT HEAD WITHOUT CONTRAST CT CERVICAL SPINE WITHOUT CONTRAST TECHNIQUE: Multidetector CT imaging of the head and cervical spine was performed following the standard protocol without intravenous contrast. Multiplanar CT image reconstructions of the cervical spine were also generated. COMPARISON:  None. FINDINGS: CT HEAD FINDINGS Brain: No intracranial hemorrhage, mass effect or midline shift. Mild cerebral atrophy. No acute cortical infarction. No mass lesion is noted on this unenhanced scan. Vascular: Atherosclerotic calcifications of carotid siphon. Atherosclerotic calcifications bilateral vertebral arteries. Skull: No skull fracture is noted. Sinuses/Orbits: No acute findings Other: None CT CERVICAL SPINE FINDINGS Alignment: There is mild anterolisthesis about 2.5 mm C4 on C5 vertebral body. Otherwise normal alignment. Skull base and vertebrae: Degenerative changes are noted C1-C2 articulation. Mild anterior spurring lower endplate of C2. Minimal anterior spurring lower endplate of C4. Mild to moderate anterior spurring  upper and lower endplate of C5 and C6 vertebral body. Moderate anterior spurring upper endplate of C7 vertebral body. Mild posterior spurring lower endplate of C6 vertebral body. No acute fracture is noted. Soft tissues and spinal canal: No prevertebral soft tissue swelling. No definite spinal canal hematoma. Disc levels: There is mild disc space flattening at C4-C5 level. Mild disc space flattening at C4-C5 level. Moderate disc space flattening with some vacuum disc phenomenon at C6-C7 level. Minimal disc space flattening at C7 T1 level. Upper chest:  Lung apices are not visualized. Other: None IMPRESSION: 1. No acute intracranial abnormality.  Mild cerebral atrophy. 2. No cervical spine acute fracture. There is about 2.5 mm  anterolisthesis C4 on C5 vertebral body. Multilevel mild degenerative changes as described above. No prevertebral soft tissue swelling. Cervical airway is patent. Electronically Signed   By: Lahoma Crocker M.D.   On: 01/09/2017 15:36   Ct Abdomen Pelvis W Contrast  Result Date: 01/09/2017 CLINICAL DATA:  66 year old male with acute chest, abdominal and pelvic pain following fall off horse today. Initial encounter. EXAM: CT CHEST, ABDOMEN, AND PELVIS WITH CONTRAST TECHNIQUE: Multidetector CT imaging of the chest, abdomen and pelvis was performed following the standard protocol during bolus administration of intravenous contrast. CONTRAST:  100 cc intravenous Isovue-300 COMPARISON:  01/09/2017 chest and pelvis radiographs. FINDINGS: CT CHEST FINDINGS Cardiovascular: Cardiomegaly and moderate coronary artery calcifications noted. Aortic valvular calcifications are present. There is no evidence of thoracic aortic aneurysm. No pericardial effusion identified. Mediastinum/Nodes: No mediastinal hematoma or enlarged lymph nodes. Lungs/Pleura: A contusion within the lateral right upper lobe noted. A small right pleural effusion is present. There is no evidence of pneumothorax. A 1 cm left lower lobe  nodule (image 36) is present. No other pulmonary abnormalities identified. Musculoskeletal: Fractures of the lateral right 1st-8th ribs noted. CT ABDOMEN PELVIS FINDINGS Hepatobiliary: A cyst within the central liver is identified without other hepatic abnormality. The gallbladder is unremarkable. There is no evidence of biliary dilatation. Pancreas: Unremarkable Spleen: Unremarkable Adrenals/Urinary Tract: The kidneys, adrenal glands and bladder are unremarkable except for small right renal cyst. Stomach/Bowel: Stomach is within normal limits. Appendix appears normal. No evidence of bowel wall thickening, distention, or inflammatory changes. Vascular/Lymphatic: No significant vascular findings are present. No enlarged abdominal or pelvic lymph nodes. Reproductive: Prostate unremarkable Other: No free fluid, focal collection or pneumoperitoneum. Musculoskeletal: An intertrochanteric fracture of the left femur with varus angulation is again identified. There appears to be a small hematoma just below the right inferior pubic ramus and may represent an avulsion injury. IMPRESSION: Fractures of the lateral right 1st-8th ribs with right upper lobe pulmonary contusion and small right pleural effusion. No evidence of pneumothorax. No evidence of acute injury within the abdomen. Left femoral intertrochanteric fracture with varus angulation. Question small hematoma just below the right inferior pubic ramus which may represent an avulsion injury. 1 cm left lower lobe pulmonary nodule. Consider one of the following in 3 months for both low-risk and high-risk individuals: (a) repeat chest CT, (b) follow-up PET-CT, or (c) tissue sampling. This recommendation follows the consensus statement: Guidelines for Management of Incidental Pulmonary Nodules Detected on CT Images: From the Fleischner Society 2017; Radiology 2017; 284:228-243. Electronically Signed   By: Margarette Canada M.D.   On: 01/09/2017 15:48   Dg Pelvis  Portable  Result Date: 01/09/2017 CLINICAL DATA:  Fall from horse. EXAM: PORTABLE PELVIS 1-2 VIEWS COMPARISON:  None. FINDINGS: Single AP view of the pelvis. Femoral heads are located. Sacroiliac joints are symmetric. Comminuted intratrochanteric proximal left femur fracture with varus angulation. Vascular calcifications. IMPRESSION: Proximal left femur fracture. Electronically Signed   By: Abigail Miyamoto M.D.   On: 01/09/2017 13:53   Dg Chest Portable 1 View  Result Date: 01/09/2017 CLINICAL DATA:  Fall from horse. EXAM: PORTABLE CHEST 1 VIEW COMPARISON:  None. FINDINGS: Numerous leads and wires project over the chest. Limited, AP portable radiograph with apical lordotic positioning. Right-sided rib fracture or fractures identified superiorly and laterally. Midline trachea. Cardiomegaly accentuated by AP portable technique. Superior mediastinal soft tissue fullness is most likely due to AP portable technique and patient size. No pleural effusion or pneumothorax. No lobar consolidation. No congestive  failure. No free intraperitoneal air. IMPRESSION: Right-sided rib fracture or fractures superiorly and laterally. No gross pneumothorax or other acute cardiopulmonary disease. Cardiomegaly without congestive failure. Electronically Signed   By: Abigail Miyamoto M.D.   On: 01/09/2017 13:52   Dg Femur Min 2 Views Left  Result Date: 01/09/2017 CLINICAL DATA:  Level 2 trauma, patient fell from horse Best obtainable due to patients condition EXAM: LEFT FEMUR 2 VIEWS COMPARISON:  None. FINDINGS: Acute intertrochanteric fracture of the proximal LEFT femur with varus angulation. Distal femur is normal. Atherosclerotic calcification noted. IMPRESSION: Intertrochanteric LEFT femur fracture. Electronically Signed   By: Suzy Bouchard M.D.   On: 01/09/2017 13:57   I have independently reviewed the xrays which show a significantly displaced left intertrochanteric hip fracture.   ROS Blood pressure (!) 165/61, pulse 62,  temperature 98.3 F (36.8 C), temperature source Oral, resp. rate 16, height 5' 9"  (1.753 m), weight 224 lb (101.6 kg), SpO2 98 %. Physical Exam  Constitutional: He is oriented to person, place, and time. He appears well-developed and well-nourished.  HENT:  Head: Normocephalic and atraumatic.  Eyes: EOM are normal. Pupils are equal, round, and reactive to light.  Neck: Normal range of motion. Neck supple.  Cardiovascular: Normal rate and regular rhythm.   Respiratory: He exhibits tenderness.  GI: Soft.  Musculoskeletal:       Left hip: He exhibits decreased range of motion, decreased strength, tenderness, bony tenderness and deformity.  Neurological: He is alert and oriented to person, place, and time.  Skin: Skin is warm and dry.  Psychiatric: He has a normal mood and affect.    Assessment/Plan: Left displaced intertrochanteric hip fracture 1)  To the OR today for open reduction/internal fixation of his left hip.  Risks and benefits have been discussed with the patient and hi wife and informed consent is obtained.  Mcarthur Rossetti 01/09/2017, 4:23 PM

## 2017-01-09 NOTE — Brief Op Note (Signed)
01/09/2017  6:16 PM  PATIENT:  [name]  66 y.o. male  PRE-OPERATIVE DIAGNOSIS:  LEFT FEMUR FRACTURE - INTERTROCHANTERIC  POST-OPERATIVE DIAGNOSIS:  LEFT FEMUR FRACTURE - INTERTROCHANTERIC  PROCEDURE:  Procedure(s): INTRAMEDULLARY (IM) NAIL FEMORAL (Left)  SURGEON:  Surgeon(s) and Role:    * Kathryne Hitch, MD - Primary  PHYSICIAN ASSISTANT: Rexene Edison, PA-C  ANESTHESIA:   general  EBL:  Total I/O In: 1000 [I.V.:500; IV Piggyback:500] Out: 150 [Blood:150]  COUNTS:  YES  DICTATION: .Other Dictation: Dictation Number [id number]  PLAN OF CARE: Admit to inpatient   PATIENT DISPOSITION:  PACU - hemodynamically stable.   Delay start of Pharmacological VTE agent (>24hrs) due to surgical blood loss or risk of bleeding: no

## 2017-01-09 NOTE — Transfer of Care (Signed)
Immediate Anesthesia Transfer of Care Note  Patient: Kevin Bonilla  Procedure(s) Performed: Procedure(s): INTRAMEDULLARY (IM) NAIL FEMORAL (Left)  Patient Location: PACU  Anesthesia Type:General  Level of Consciousness: awake  Airway & Oxygen Therapy: Patient Spontanous Breathing and Patient connected to face mask oxygen  Post-op Assessment: Report given to RN and Post -op Vital signs reviewed and stable  Post vital signs: Reviewed and stable  Last Vitals:  Vitals:   01/09/17 1515 01/09/17 1530  BP: (!) 151/57 (!) 165/61  Pulse: 65 62  Resp: 18 16  Temp:      Last Pain:  Vitals:   01/09/17 1601  TempSrc:   PainSc: 8          Complications: No apparent anesthesia complications

## 2017-01-09 NOTE — Anesthesia Postprocedure Evaluation (Signed)
Anesthesia Post Note  Patient: Kevin Bonilla  Procedure(s) Performed: Procedure(s) (LRB): INTRAMEDULLARY (IM) NAIL FEMORAL (Left)  Patient location during evaluation: PACU Anesthesia Type: General Level of consciousness: awake and alert Pain management: pain level controlled Vital Signs Assessment: post-procedure vital signs reviewed and stable Respiratory status: spontaneous breathing, nonlabored ventilation, respiratory function stable and patient connected to nasal cannula oxygen Cardiovascular status: blood pressure returned to baseline and stable Postop Assessment: no signs of nausea or vomiting Anesthetic complications: no       Last Vitals:  Vitals:   01/09/17 1515 01/09/17 1530  BP: (!) 151/57 (!) 165/61  Pulse: 65 62  Resp: 18 16  Temp:      Last Pain:  Vitals:   01/09/17 1601  TempSrc:   PainSc: 8                  [name] DAVID

## 2017-01-09 NOTE — ED Provider Notes (Signed)
[facility]-EMERGENCY DEPT Provider Note   CSN: [id number] Arrival date & time: 01/09/17  1241     History   Chief Complaint Chief Complaint  Patient presents with  . Trauma    HPI Kevin Bonilla is a 66 y.o. male.  Patient with a history of diabetes, COPD and hypertension presents with leg pain after a fall from a horse. He was riding a horse and lost his balance and fell over onto his right side. He felt a pop in his left leg and is been complaining of excruciating pain in his left hip since that time. He presented as a level II trauma activation. He also complains of pain in his right ribs and a headache. He did hit his head but denies loss of consciousness. He has pain in his upper back area but denies any neck or lower back pain. He does report some mild shortness of breath. He denies being on anticoagulants. He was brought in on a long spine board but no c-collar.      Past Medical History:  Diagnosis Date  . Asthma   . Diabetes mellitus without complication (HCC)   . Hypertension     There are no active problems to display for this patient.   No past surgical history on file.     Home Medications    Prior to Admission medications   Medication Sig Start Date End Date Taking? Authorizing Provider  Budesonide-Formoterol Fumarate (SYMBICORT IN) Inhale 2 puffs into the lungs 2 (two) times daily.   Yes Historical Provider, MD  BUSPIRONE HCL PO Take 0.5 tablets by mouth 2 (two) times daily.   Yes Historical Provider, MD  clonazePAM (KLONOPIN) 1 MG tablet Take 1 mg by mouth at bedtime.   Yes Historical Provider, MD  Cyanocobalamin (VITAMIN B-12 PO) Take 1 tablet by mouth daily.   Yes Historical Provider, MD  DilTIAZem HCl Coated Beads (DILTIAZEM CD PO) Take 2 capsules by mouth daily.   Yes Historical Provider, MD  GABAPENTIN PO Take 2 capsules by mouth at bedtime.   Yes Historical Provider, MD  ibuprofen (ADVIL,MOTRIN) 200 MG tablet Take 200-400 mg by mouth every 6  (six) hours as needed for headache (pain).   Yes Historical Provider, MD  METFORMIN HCL PO Take 1 tablet by mouth 2 (two) times daily.   Yes Historical Provider, MD  oxyCODONE-acetaminophen (PERCOCET/ROXICET) 5-325 MG tablet Take 1 tablet by mouth 2 (two) times daily as needed (pain).   Yes Historical Provider, MD  PREDNISONE PO Take 1-3 tablets by mouth See admin instructions. Tapered course (unknown start date) - take 3 tablets by mouth today (01/09/17) at bedtime, then take 2 tablets daily at bedtime for 3 days, then take 1 tablet daily at bedtime for 3 days   Yes Historical Provider, MD  PRESCRIPTION MEDICATION Take 1 capsule by mouth 2 (two) times daily. Light green capsule   Yes Historical Provider, MD  VALSARTAN PO Take 1 tablet by mouth daily.   Yes Historical Provider, MD    Family History No family history on file.  Social History Social History  Substance Use Topics  . Smoking status: Not on file  . Smokeless tobacco: Not on file  . Alcohol use Not on file     Allergies   Lisinopril   Review of Systems Review of Systems  Constitutional: Negative for activity change, appetite change and fever.  HENT: Negative for dental problem, nosebleeds and trouble swallowing.   Eyes: Negative for pain and visual  disturbance.  Respiratory: Positive for shortness of breath.   Cardiovascular: Positive for chest pain.  Gastrointestinal: Positive for abdominal pain. Negative for nausea and vomiting.  Genitourinary: Negative for dysuria and hematuria.  Musculoskeletal: Positive for arthralgias. Negative for back pain, joint swelling and neck pain.  Skin: Negative for wound.  Neurological: Positive for headaches. Negative for weakness and numbness.  Psychiatric/Behavioral: Negative for confusion.     Physical Exam Updated Vital Signs BP (!) 165/61   Pulse 62   Temp 98.3 F (36.8 C) (Oral)   Resp 16   Ht 5\' 9"  (1.753 m)   Wt 224 lb (101.6 kg)   SpO2 98%   BMI 33.08 kg/m    Physical Exam  Constitutional: He is oriented to person, place, and time. He appears well-developed and well-nourished.  HENT:  Head: Normocephalic and atraumatic.  Nose: Nose normal.  No hemotympanum  Eyes: Conjunctivae are normal. Pupils are equal, round, and reactive to light.  Neck:  No pain to the cervical, or LS spine.  There is tenderness in the midthoracic spine. No step-offs or deformities noted  Cardiovascular: Normal rate and regular rhythm.   No murmur heard. No evidence of external trauma to the chest or abdomen. Positive tenderness along the right mid ribs. No crepitus or deformity noted.  Pulmonary/Chest: Effort normal and breath sounds normal. No respiratory distress. He has no wheezes. He exhibits no tenderness.  Abdominal: Soft. Bowel sounds are normal. He exhibits no distension. There is tenderness.  Tenderness to the right upper abdomen  Musculoskeletal: Normal range of motion.  Positive pain to the left hip. He is maintaining his leg flexed at the hip and knee joint. Pedal pulses are intact. He has no pain to the knee or ankle. No other pain on palpation or range of motion of the extremities.  Neurological: He is alert and oriented to person, place, and time.  Skin: Skin is warm and dry. Capillary refill takes less than 2 seconds.  Psychiatric: He has a normal mood and affect.  Vitals reviewed.    ED Treatments / Results  Labs (all labs ordered are listed, but only abnormal results are displayed) Labs Reviewed  COMPREHENSIVE METABOLIC PANEL - Abnormal; Notable for the following:       Result Value   CO2 20 (*)    Glucose, Bld 154 (*)    BUN 32 (*)    Creatinine, Ser 1.32 (*)    Calcium 8.8 (*)    Total Protein 6.2 (*)    GFR calc non Af Amer 56 (*)    All other components within normal limits  CBC - Abnormal; Notable for the following:    WBC 15.3 (*)    RBC 3.95 (*)    Hemoglobin 11.5 (*)    HCT 34.7 (*)    All other components within normal limits   I-STAT CHEM 8, ED - Abnormal; Notable for the following:    BUN 33 (*)    Creatinine, Ser 1.30 (*)    Glucose, Bld 153 (*)    Calcium, Ion 1.11 (*)    Hemoglobin 11.9 (*)    HCT 35.0 (*)    All other components within normal limits  ETHANOL  PROTIME-INR  URINALYSIS, ROUTINE W REFLEX MICROSCOPIC  I-STAT CG4 LACTIC ACID, ED  TYPE AND SCREEN  ABO/RH    EKG  EKG Interpretation  Date/Time:  Sunday January 09 2017 13:12:41 EDT Ventricular Rate:  55 PR Interval:    QRS Duration: 97 QT  Interval:  398 QTC Calculation: 381 R Axis:   48 Text Interpretation:  Age not entered, assumed to be  66 years old for purpose of ECG interpretation Sinus rhythm Consider left atrial enlargement Baseline wander in lead(s) V2 V3 No old tracing to compare Confirmed by Kwaku Mostafa  MD, Jaqulyn Chancellor [phone]) on 01/09/2017 2:38:17 PM       Radiology Ct Head Wo Contrast  Result Date: 01/09/2017 CLINICAL DATA:  Larey Seat off horse today, neck pain, right hip pain EXAM: CT HEAD WITHOUT CONTRAST CT CERVICAL SPINE WITHOUT CONTRAST TECHNIQUE: Multidetector CT imaging of the head and cervical spine was performed following the standard protocol without intravenous contrast. Multiplanar CT image reconstructions of the cervical spine were also generated. COMPARISON:  None. FINDINGS: CT HEAD FINDINGS Brain: No intracranial hemorrhage, mass effect or midline shift. Mild cerebral atrophy. No acute cortical infarction. No mass lesion is noted on this unenhanced scan. Vascular: Atherosclerotic calcifications of carotid siphon. Atherosclerotic calcifications bilateral vertebral arteries. Skull: No skull fracture is noted. Sinuses/Orbits: No acute findings Other: None CT CERVICAL SPINE FINDINGS Alignment: There is mild anterolisthesis about 2.5 mm C4 on C5 vertebral body. Otherwise normal alignment. Skull base and vertebrae: Degenerative changes are noted C1-C2 articulation. Mild anterior spurring lower endplate of C2. Minimal anterior spurring  lower endplate of C4. Mild to moderate anterior spurring upper and lower endplate of C5 and C6 vertebral body. Moderate anterior spurring upper endplate of C7 vertebral body. Mild posterior spurring lower endplate of C6 vertebral body. No acute fracture is noted. Soft tissues and spinal canal: No prevertebral soft tissue swelling. No definite spinal canal hematoma. Disc levels: There is mild disc space flattening at C4-C5 level. Mild disc space flattening at C4-C5 level. Moderate disc space flattening with some vacuum disc phenomenon at C6-C7 level. Minimal disc space flattening at C7 T1 level. Upper chest:  Lung apices are not visualized. Other: None IMPRESSION: 1. No acute intracranial abnormality.  Mild cerebral atrophy. 2. No cervical spine acute fracture. There is about 2.5 mm anterolisthesis C4 on C5 vertebral body. Multilevel mild degenerative changes as described above. No prevertebral soft tissue swelling. Cervical airway is patent. Electronically Signed   By: Natasha Mead M.D.   On: 01/09/2017 15:36   Ct Chest W Contrast  Result Date: 01/09/2017 CLINICAL DATA:  66 year old male with acute chest, abdominal and pelvic pain following fall off horse today. Initial encounter. EXAM: CT CHEST, ABDOMEN, AND PELVIS WITH CONTRAST TECHNIQUE: Multidetector CT imaging of the chest, abdomen and pelvis was performed following the standard protocol during bolus administration of intravenous contrast. CONTRAST:  100 cc intravenous Isovue-300 COMPARISON:  01/09/2017 chest and pelvis radiographs. FINDINGS: CT CHEST FINDINGS Cardiovascular: Cardiomegaly and moderate coronary artery calcifications noted. Aortic valvular calcifications are present. There is no evidence of thoracic aortic aneurysm. No pericardial effusion identified. Mediastinum/Nodes: No mediastinal hematoma or enlarged lymph nodes. Lungs/Pleura: A contusion within the lateral right upper lobe noted. A small right pleural effusion is present. There is no  evidence of pneumothorax. A 1 cm left lower lobe nodule (image 36) is present. No other pulmonary abnormalities identified. Musculoskeletal: Fractures of the lateral right 1st-8th ribs noted. CT ABDOMEN PELVIS FINDINGS Hepatobiliary: A cyst within the central liver is identified without other hepatic abnormality. The gallbladder is unremarkable. There is no evidence of biliary dilatation. Pancreas: Unremarkable Spleen: Unremarkable Adrenals/Urinary Tract: The kidneys, adrenal glands and bladder are unremarkable except for small right renal cyst. Stomach/Bowel: Stomach is within normal limits. Appendix appears normal. No evidence of  bowel wall thickening, distention, or inflammatory changes. Vascular/Lymphatic: No significant vascular findings are present. No enlarged abdominal or pelvic lymph nodes. Reproductive: Prostate unremarkable Other: No free fluid, focal collection or pneumoperitoneum. Musculoskeletal: An intertrochanteric fracture of the left femur with varus angulation is again identified. There appears to be a small hematoma just below the right inferior pubic ramus and may represent an avulsion injury. IMPRESSION: Fractures of the lateral right 1st-8th ribs with right upper lobe pulmonary contusion and small right pleural effusion. No evidence of pneumothorax. No evidence of acute injury within the abdomen. Left femoral intertrochanteric fracture with varus angulation. Question small hematoma just below the right inferior pubic ramus which may represent an avulsion injury. 1 cm left lower lobe pulmonary nodule. Consider one of the following in 3 months for both low-risk and high-risk individuals: (a) repeat chest CT, (b) follow-up PET-CT, or (c) tissue sampling. This recommendation follows the consensus statement: Guidelines for Management of Incidental Pulmonary Nodules Detected on CT Images: From the Fleischner Society 2017; Radiology 2017; 284:228-243. Electronically Signed   By: Harmon Pier M.D.    On: 01/09/2017 15:48   Ct Cervical Spine Wo Contrast  Result Date: 01/09/2017 CLINICAL DATA:  Larey Seat off horse today, neck pain, right hip pain EXAM: CT HEAD WITHOUT CONTRAST CT CERVICAL SPINE WITHOUT CONTRAST TECHNIQUE: Multidetector CT imaging of the head and cervical spine was performed following the standard protocol without intravenous contrast. Multiplanar CT image reconstructions of the cervical spine were also generated. COMPARISON:  None. FINDINGS: CT HEAD FINDINGS Brain: No intracranial hemorrhage, mass effect or midline shift. Mild cerebral atrophy. No acute cortical infarction. No mass lesion is noted on this unenhanced scan. Vascular: Atherosclerotic calcifications of carotid siphon. Atherosclerotic calcifications bilateral vertebral arteries. Skull: No skull fracture is noted. Sinuses/Orbits: No acute findings Other: None CT CERVICAL SPINE FINDINGS Alignment: There is mild anterolisthesis about 2.5 mm C4 on C5 vertebral body. Otherwise normal alignment. Skull base and vertebrae: Degenerative changes are noted C1-C2 articulation. Mild anterior spurring lower endplate of C2. Minimal anterior spurring lower endplate of C4. Mild to moderate anterior spurring upper and lower endplate of C5 and C6 vertebral body. Moderate anterior spurring upper endplate of C7 vertebral body. Mild posterior spurring lower endplate of C6 vertebral body. No acute fracture is noted. Soft tissues and spinal canal: No prevertebral soft tissue swelling. No definite spinal canal hematoma. Disc levels: There is mild disc space flattening at C4-C5 level. Mild disc space flattening at C4-C5 level. Moderate disc space flattening with some vacuum disc phenomenon at C6-C7 level. Minimal disc space flattening at C7 T1 level. Upper chest:  Lung apices are not visualized. Other: None IMPRESSION: 1. No acute intracranial abnormality.  Mild cerebral atrophy. 2. No cervical spine acute fracture. There is about 2.5 mm anterolisthesis C4 on C5  vertebral body. Multilevel mild degenerative changes as described above. No prevertebral soft tissue swelling. Cervical airway is patent. Electronically Signed   By: Natasha Mead M.D.   On: 01/09/2017 15:36   Ct Abdomen Pelvis W Contrast  Result Date: 01/09/2017 CLINICAL DATA:  66 year old male with acute chest, abdominal and pelvic pain following fall off horse today. Initial encounter. EXAM: CT CHEST, ABDOMEN, AND PELVIS WITH CONTRAST TECHNIQUE: Multidetector CT imaging of the chest, abdomen and pelvis was performed following the standard protocol during bolus administration of intravenous contrast. CONTRAST:  100 cc intravenous Isovue-300 COMPARISON:  01/09/2017 chest and pelvis radiographs. FINDINGS: CT CHEST FINDINGS Cardiovascular: Cardiomegaly and moderate coronary artery calcifications noted. Aortic valvular  calcifications are present. There is no evidence of thoracic aortic aneurysm. No pericardial effusion identified. Mediastinum/Nodes: No mediastinal hematoma or enlarged lymph nodes. Lungs/Pleura: A contusion within the lateral right upper lobe noted. A small right pleural effusion is present. There is no evidence of pneumothorax. A 1 cm left lower lobe nodule (image 36) is present. No other pulmonary abnormalities identified. Musculoskeletal: Fractures of the lateral right 1st-8th ribs noted. CT ABDOMEN PELVIS FINDINGS Hepatobiliary: A cyst within the central liver is identified without other hepatic abnormality. The gallbladder is unremarkable. There is no evidence of biliary dilatation. Pancreas: Unremarkable Spleen: Unremarkable Adrenals/Urinary Tract: The kidneys, adrenal glands and bladder are unremarkable except for small right renal cyst. Stomach/Bowel: Stomach is within normal limits. Appendix appears normal. No evidence of bowel wall thickening, distention, or inflammatory changes. Vascular/Lymphatic: No significant vascular findings are present. No enlarged abdominal or pelvic lymph nodes.  Reproductive: Prostate unremarkable Other: No free fluid, focal collection or pneumoperitoneum. Musculoskeletal: An intertrochanteric fracture of the left femur with varus angulation is again identified. There appears to be a small hematoma just below the right inferior pubic ramus and may represent an avulsion injury. IMPRESSION: Fractures of the lateral right 1st-8th ribs with right upper lobe pulmonary contusion and small right pleural effusion. No evidence of pneumothorax. No evidence of acute injury within the abdomen. Left femoral intertrochanteric fracture with varus angulation. Question small hematoma just below the right inferior pubic ramus which may represent an avulsion injury. 1 cm left lower lobe pulmonary nodule. Consider one of the following in 3 months for both low-risk and high-risk individuals: (a) repeat chest CT, (b) follow-up PET-CT, or (c) tissue sampling. This recommendation follows the consensus statement: Guidelines for Management of Incidental Pulmonary Nodules Detected on CT Images: From the Fleischner Society 2017; Radiology 2017; 284:228-243. Electronically Signed   By: Harmon Pier M.D.   On: 01/09/2017 15:48   Dg Pelvis Portable  Result Date: 01/09/2017 CLINICAL DATA:  Fall from horse. EXAM: PORTABLE PELVIS 1-2 VIEWS COMPARISON:  None. FINDINGS: Single AP view of the pelvis. Femoral heads are located. Sacroiliac joints are symmetric. Comminuted intratrochanteric proximal left femur fracture with varus angulation. Vascular calcifications. IMPRESSION: Proximal left femur fracture. Electronically Signed   By: Jeronimo Greaves M.D.   On: 01/09/2017 13:53   Dg Chest Portable 1 View  Result Date: 01/09/2017 CLINICAL DATA:  Fall from horse. EXAM: PORTABLE CHEST 1 VIEW COMPARISON:  None. FINDINGS: Numerous leads and wires project over the chest. Limited, AP portable radiograph with apical lordotic positioning. Right-sided rib fracture or fractures identified superiorly and laterally.  Midline trachea. Cardiomegaly accentuated by AP portable technique. Superior mediastinal soft tissue fullness is most likely due to AP portable technique and patient size. No pleural effusion or pneumothorax. No lobar consolidation. No congestive failure. No free intraperitoneal air. IMPRESSION: Right-sided rib fracture or fractures superiorly and laterally. No gross pneumothorax or other acute cardiopulmonary disease. Cardiomegaly without congestive failure. Electronically Signed   By: Jeronimo Greaves M.D.   On: 01/09/2017 13:52   Dg Femur Min 2 Views Left  Result Date: 01/09/2017 CLINICAL DATA:  Level 2 trauma, patient fell from horse Best obtainable due to patients condition EXAM: LEFT FEMUR 2 VIEWS COMPARISON:  None. FINDINGS: Acute intertrochanteric fracture of the proximal LEFT femur with varus angulation. Distal femur is normal. Atherosclerotic calcification noted. IMPRESSION: Intertrochanteric LEFT femur fracture. Electronically Signed   By: Genevive Bi M.D.   On: 01/09/2017 13:57    Procedures Procedures (including critical care time)  Medications Ordered in ED Medications  morphine 4 MG/ML injection 4 mg (4 mg Intravenous Given 01/09/17 1259)  iopamidol (ISOVUE-300) 61 % injection (  Contrast Given 01/09/17 1315)  morphine 4 MG/ML injection 4 mg (4 mg Intravenous Given 01/09/17 1355)  sodium chloride 0.9 % bolus 500 mL (0 mLs Intravenous Stopped 01/09/17 1539)  HYDROmorphone (DILAUDID) injection 1 mg (1 mg Intravenous Given 01/09/17 1446)     Initial Impression / Assessment and Plan / ED Course  I have reviewed the triage vital signs and the nursing notes.  Pertinent labs & imaging results that were available during my care of the patient were reviewed by me and considered in my medical decision making (see chart for details).  Clinical Course as of Jan 10 1600  Sun Jan 09, 2017  1525 Spoke with Dr. Magnus Ivan with ortho who will see the pt.  [MB]    Clinical Course User Index [MB]  Rolan Bucco, MD    In addition to patient's hip fracture, I he also has 8 right rib fractures with pulmonary contusion. No other intra-abdominal or thoracic injuries are identified. He has no other extremity complaints of pain. I spoke with Dr. Donell Beers with the trauma service she will admit the patient.  Final Clinical Impressions(s) / ED Diagnoses   Final diagnoses:  Fall, initial encounter  Closed fracture of multiple ribs of right side, initial encounter  Contusion of right lung, initial encounter  Closed left hip fracture, initial encounter [facility])    New Prescriptions New Prescriptions   No medications on file     Rolan Bucco, MD 01/09/17 [phone]

## 2017-01-09 NOTE — Anesthesia Procedure Notes (Signed)
Procedure Name: Intubation Date/Time: 01/09/2017 5:31 PM Performed by: Brien Mates D Pre-anesthesia Checklist: Patient identified, Emergency Drugs available, Suction available, Patient being monitored and Timeout performed Patient Re-evaluated:Patient Re-evaluated prior to inductionOxygen Delivery Method: Circle system utilized Preoxygenation: Pre-oxygenation with 100% oxygen Intubation Type: IV induction, Cricoid Pressure applied and Rapid sequence Laryngoscope Size: Miller and 2 Grade View: Grade I Tube type: Oral Tube size: 7.5 mm Number of attempts: 1 Airway Equipment and Method: Stylet Placement Confirmation: ETT inserted through vocal cords under direct vision,  positive ETCO2 and breath sounds checked- equal and bilateral Secured at: 22 cm Tube secured with: Tape Dental Injury: Teeth and Oropharynx as per pre-operative assessment

## 2017-01-09 NOTE — ED Notes (Signed)
Pocket knife, watch, and brass knuckles given to pts wife

## 2017-01-09 NOTE — ED Notes (Signed)
CNS intact distal to fracture

## 2017-01-09 NOTE — ED Notes (Signed)
Pt remains in CT

## 2017-01-09 NOTE — ED Notes (Signed)
Pt placed at 30 degrees per dr Fredderick Phenix

## 2017-01-09 NOTE — Anesthesia Preprocedure Evaluation (Addendum)
Anesthesia Evaluation  Patient identified by MRN, date of birth, ID band Patient awake    Reviewed: Allergy & Precautions, NPO status , Patient's Chart, lab work & pertinent test results  Airway Mallampati: II  TM Distance: >3 FB Neck ROM: Full    Dental  (+) Dental Advisory Given, Poor Dentition   Pulmonary asthma , former smoker,    Pulmonary exam normal        Cardiovascular hypertension, Normal cardiovascular exam     Neuro/Psych    GI/Hepatic   Endo/Other  diabetes  Renal/GU      Musculoskeletal   Abdominal   Peds  Hematology   Anesthesia Other Findings   Reproductive/Obstetrics                            Anesthesia Physical Anesthesia Plan  ASA: III and emergent  Anesthesia Plan: General   Post-op Pain Management:    Induction: Intravenous, Cricoid pressure planned and Rapid sequence  Airway Management Planned: Oral ETT  Additional Equipment:   Intra-op Plan:   Post-operative Plan: Extubation in OR  Informed Consent: I have reviewed the patients History and Physical, chart, labs and discussed the procedure including the risks, benefits and alternatives for the proposed anesthesia with the patient or authorized representative who has indicated his/her understanding and acceptance.   Dental advisory given  Plan Discussed with: CRNA and Surgeon  Anesthesia Plan Comments:        Anesthesia Quick Evaluation

## 2017-01-09 NOTE — ED Notes (Signed)
ED Provider at bedside. 

## 2017-01-09 NOTE — Progress Notes (Signed)
Responded to page to ED. Waited for X-ray tech to clear rm, then nurse said pt would be fine, and wife in waiting area could come back. Provided emotional/spiritual support and prayer -- which they appreciated. Pt said he's [employer].   01/09/17 1300  Clinical Encounter Type  Visited With Patient and family together;Health care provider  Visit Type Initial;Psychological support;Spiritual support;Social support;ED  Referral From Nurse  Spiritual Encounters  Spiritual Needs Prayer;Emotional  Stress Factors  Patient Stress Factors Health changes;Loss of control  Family Stress Factors Family relationships;Health changes;Loss of control   Kevin Bonilla, [address]

## 2017-01-09 NOTE — Anesthesia Preprocedure Evaluation (Addendum)
Anesthesia Evaluation  Patient identified by MRN, date of birth, ID band Patient awake    Reviewed: Allergy & Precautions, NPO status , Patient's Chart, lab work & pertinent test results  Airway Mallampati: I  TM Distance: >3 FB Neck ROM: Full    Dental   Pulmonary asthma ,    Pulmonary exam normal        Cardiovascular hypertension, Pt. on medications Normal cardiovascular exam     Neuro/Psych    GI/Hepatic   Endo/Other  diabetes, Type 2, Oral Hypoglycemic Agents  Renal/GU      Musculoskeletal   Abdominal   Peds  Hematology   Anesthesia Other Findings   Reproductive/Obstetrics                            Anesthesia Physical Anesthesia Plan  ASA: II  Anesthesia Plan: General   Post-op Pain Management:    Induction: Intravenous  Airway Management Planned: Oral ETT  Additional Equipment:   Intra-op Plan:   Post-operative Plan: Extubation in OR  Informed Consent: I have reviewed the patients History and Physical, chart, labs and discussed the procedure including the risks, benefits and alternatives for the proposed anesthesia with the patient or authorized representative who has indicated his/her understanding and acceptance.     Plan Discussed with: CRNA and Surgeon  Anesthesia Plan Comments:         Anesthesia Quick Evaluation

## 2017-01-09 NOTE — Progress Notes (Signed)
Orthopedic Tech Progress Note Patient Details:  Kevin Bonilla [birth year] [id number]  Patient ID: [name], male   DOB: 11/15/1950, 66 y.o.   MRN: [id number]   Nikki Dom 01/09/2017, 12:55 PM Made level 2 trauma visit

## 2017-01-09 NOTE — Progress Notes (Signed)
Orthopedic Tech Progress Note Patient Details:  Kevin Bonilla [birth year] [id number]  Ortho Devices Ortho Device/Splint Location: applied ohf Ortho Device/Splint Interventions: Ordered, Application   Jennye Moccasin 01/09/2017, 7:22 PM

## 2017-01-10 ENCOUNTER — Inpatient Hospital Stay (HOSPITAL_COMMUNITY): Payer: BLUE CROSS/BLUE SHIELD

## 2017-01-10 ENCOUNTER — Encounter (HOSPITAL_COMMUNITY): Payer: Self-pay | Admitting: Orthopaedic Surgery

## 2017-01-10 LAB — GLUCOSE, CAPILLARY
GLUCOSE-CAPILLARY: 181 mg/dL — AB (ref 65–99)
Glucose-Capillary: 110 mg/dL — ABNORMAL HIGH (ref 65–99)
Glucose-Capillary: 143 mg/dL — ABNORMAL HIGH (ref 65–99)

## 2017-01-10 LAB — CBC
HEMATOCRIT: 30.4 % — AB (ref 39.0–52.0)
Hemoglobin: 9.8 g/dL — ABNORMAL LOW (ref 13.0–17.0)
MCH: 28.6 pg (ref 26.0–34.0)
MCHC: 32.2 g/dL (ref 30.0–36.0)
MCV: 88.6 fL (ref 78.0–100.0)
Platelets: 176 10*3/uL (ref 150–400)
RBC: 3.43 MIL/uL — ABNORMAL LOW (ref 4.22–5.81)
RDW: 14.3 % (ref 11.5–15.5)
WBC: 11 10*3/uL — ABNORMAL HIGH (ref 4.0–10.5)

## 2017-01-10 LAB — BASIC METABOLIC PANEL
Anion gap: 8 (ref 5–15)
BUN: 21 mg/dL — AB (ref 6–20)
CO2: 23 mmol/L (ref 22–32)
Calcium: 8.4 mg/dL — ABNORMAL LOW (ref 8.9–10.3)
Chloride: 103 mmol/L (ref 101–111)
Creatinine, Ser: 1.17 mg/dL (ref 0.61–1.24)
GFR calc Af Amer: >60 mL/min (ref >60)
GLUCOSE: 129 mg/dL — AB (ref 65–99)
POTASSIUM: 3.9 mmol/L (ref 3.5–5.1)
Sodium: 134 mmol/L — ABNORMAL LOW (ref 135–145)

## 2017-01-10 LAB — MRSA PCR SCREENING: MRSA by PCR: NEGATIVE

## 2017-01-10 LAB — HIV ANTIBODY (ROUTINE TESTING W REFLEX): HIV Screen 4th Generation wRfx: NONREACTIVE
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
## 2017-01-10 NOTE — Progress Notes (Signed)
Subjective: 1 Day Post-Op Procedure(s) (LRB): INTRAMEDULLARY (IM) NAIL FEMORAL (Left) Patient reports pain as moderate.  Tolerated left hip surgery well yesterday.  Objective: Vital signs in last 24 hours: Temp:  [97.7 F (36.5 C)-98.5 F (36.9 C)] 98.5 F (36.9 C) (04/16 0351) Pulse Rate:  [54-72] 65 (04/16 0351) Resp:  [14-19] 16 (04/16 0351) BP: (126-173)/(48-95) 127/48 (04/16 0351) SpO2:  [94 %-100 %] 95 % (04/16 0351) Weight:  [224 lb (101.6 kg)-226 lb 3.1 oz (102.6 kg)] 226 lb 3.1 oz (102.6 kg) (04/15 2000)  Intake/Output from previous day: 04/15 0701 - 04/16 0700 In: 2325 [I.V.:1625; IV Piggyback:700] Out: 3150 [Urine:3000; Blood:150] Intake/Output this shift: No intake/output data recorded.   Recent Labs  01/09/17 1256 01/09/17 1303 01/10/17 0217  HGB 11.5* 11.9* 9.8*    Recent Labs  01/09/17 1256 01/09/17 1303 01/10/17 0217  WBC 15.3*  --  11.0*  RBC 3.95*  --  3.43*  HCT 34.7* 35.0* 30.4*  PLT 210  --  176    Recent Labs  01/09/17 1256 01/09/17 1303 01/10/17 0217  NA 135 137 134*  K 4.5 4.5 3.9  CL 106 106 103  CO2 20*  --  23  BUN 32* 33* 21*  CREATININE 1.32* 1.30* 1.17  GLUCOSE 154* 153* 129*  CALCIUM 8.8*  --  8.4*    Recent Labs  01/09/17 1256  INR 1.03    Sensation intact distally Intact pulses distally Dorsiflexion/Plantar flexion intact Incision: scant drainage  Assessment/Plan: 1 Day Post-Op Procedure(s) (LRB): INTRAMEDULLARY (IM) NAIL FEMORAL (Left) Up with therapy - WBAT left hip  Kathryne Hitch 01/10/2017, 7:32 AM

## 2017-01-10 NOTE — Progress Notes (Signed)
Pt still unable to void. Feels urge. Bladder scan >400. I/O cath x2 last shift. Discussed with trauma provider. Order to go ahead with foley placement given. Pt in agreement with plan. Foley team informed of need.

## 2017-01-10 NOTE — Progress Notes (Signed)
[facility] Progress Note  1 Day Post-Op  Subjective: CC: hip pain  Pt states he is not having too much rib pain. He is breathing without issues. He is not on O2. He states he has been falling cause of dizziness. He states it is worse when he stands and is more of a sensation that he is off balance then the world spinning around him. He denies CP, SOB, or visual changes with the onset of dizziness. He has a heart murmur that he has been seen by a cardiologist for. He has COPD and experiences DOE. No nausea or vomiting.   Objective: Vital signs in last 24 hours: Temp:  [97.7 F (36.5 C)-98.5 F (36.9 C)] 98.5 F (36.9 C) (04/16 0351) Pulse Rate:  [54-72] 65 (04/16 0351) Resp:  [14-19] 16 (04/16 0351) BP: (126-173)/(48-95) 127/48 (04/16 0351) SpO2:  [94 %-100 %] 95 % (04/16 0351) Weight:  [224 lb (101.6 kg)-226 lb 3.1 oz (102.6 kg)] 226 lb 3.1 oz (102.6 kg) (04/15 2000) Last BM Date: 01/09/17  Intake/Output from previous day: 04/15 0701 - 04/16 0700 In: 2325 [I.V.:1625; IV Piggyback:700] Out: 3150 [Urine:3000; Blood:150] Intake/Output this shift: No intake/output data recorded.  PE: Gen:  Alert, NAD, pleasant, cooperative, well appearing Card:  RRR, systolic murmur noted Pulm:  CTA, no W/R/R, rate and effort normal Abd: Soft, not distended, no TTP Skin: no rashes noted, warm and dry Extremities: no edema, sensation intact of BLE  Lab Results:   Recent Labs  01/09/17 1256 01/09/17 1303 01/10/17 0217  WBC 15.3*  --  11.0*  HGB 11.5* 11.9* 9.8*  HCT 34.7* 35.0* 30.4*  PLT 210  --  176   BMET  Recent Labs  01/09/17 1256 01/09/17 1303 01/10/17 0217  NA 135 137 134*  K 4.5 4.5 3.9  CL 106 106 103  CO2 20*  --  23  GLUCOSE 154* 153* 129*  BUN 32* 33* 21*  CREATININE 1.32* 1.30* 1.17  CALCIUM 8.8*  --  8.4*   PT/INR  Recent Labs  01/09/17 1256  LABPROT 13.5  INR 1.03   CMP     Component Value Date/Time   NA 134 (L) 01/10/2017 0217   K  3.9 01/10/2017 0217   CL 103 01/10/2017 0217   CO2 23 01/10/2017 0217   GLUCOSE 129 (H) 01/10/2017 0217   BUN 21 (H) 01/10/2017 0217   CREATININE 1.17 01/10/2017 0217   CALCIUM 8.4 (L) 01/10/2017 0217   PROT 6.2 (L) 01/09/2017 1256   ALBUMIN 4.0 01/09/2017 1256   AST 25 01/09/2017 1256   ALT 23 01/09/2017 1256   ALKPHOS 53 01/09/2017 1256   BILITOT 0.5 01/09/2017 1256   GFRNONAA >60 01/10/2017 0217   GFRAA >60 01/10/2017 0217   Lipase  No results found for: LIPASE     Studies/Results: Ct Head Wo Contrast  Result Date: 01/09/2017 CLINICAL DATA:  Larey Seat off horse today, neck pain, right hip pain EXAM: CT HEAD WITHOUT CONTRAST CT CERVICAL SPINE WITHOUT CONTRAST TECHNIQUE: Multidetector CT imaging of the head and cervical spine was performed following the standard protocol without intravenous contrast. Multiplanar CT image reconstructions of the cervical spine were also generated. COMPARISON:  None. FINDINGS: CT HEAD FINDINGS Brain: No intracranial hemorrhage, mass effect or midline shift. Mild cerebral atrophy. No acute cortical infarction. No mass lesion is noted on this unenhanced scan. Vascular: Atherosclerotic calcifications of carotid siphon. Atherosclerotic calcifications bilateral vertebral arteries. Skull: No skull fracture is noted. Sinuses/Orbits: No acute findings Other:  None CT CERVICAL SPINE FINDINGS Alignment: There is mild anterolisthesis about 2.5 mm C4 on C5 vertebral body. Otherwise normal alignment. Skull base and vertebrae: Degenerative changes are noted C1-C2 articulation. Mild anterior spurring lower endplate of C2. Minimal anterior spurring lower endplate of C4. Mild to moderate anterior spurring upper and lower endplate of C5 and C6 vertebral body. Moderate anterior spurring upper endplate of C7 vertebral body. Mild posterior spurring lower endplate of C6 vertebral body. No acute fracture is noted. Soft tissues and spinal canal: No prevertebral soft tissue swelling. No  definite spinal canal hematoma. Disc levels: There is mild disc space flattening at C4-C5 level. Mild disc space flattening at C4-C5 level. Moderate disc space flattening with some vacuum disc phenomenon at C6-C7 level. Minimal disc space flattening at C7 T1 level. Upper chest:  Lung apices are not visualized. Other: None IMPRESSION: 1. No acute intracranial abnormality.  Mild cerebral atrophy. 2. No cervical spine acute fracture. There is about 2.5 mm anterolisthesis C4 on C5 vertebral body. Multilevel mild degenerative changes as described above. No prevertebral soft tissue swelling. Cervical airway is patent. Electronically Signed   By: Natasha Mead M.D.   On: 01/09/2017 15:36   Ct Chest W Contrast  Result Date: 01/09/2017 CLINICAL DATA:  66 year old male with acute chest, abdominal and pelvic pain following fall off horse today. Initial encounter. EXAM: CT CHEST, ABDOMEN, AND PELVIS WITH CONTRAST TECHNIQUE: Multidetector CT imaging of the chest, abdomen and pelvis was performed following the standard protocol during bolus administration of intravenous contrast. CONTRAST:  100 cc intravenous Isovue-300 COMPARISON:  01/09/2017 chest and pelvis radiographs. FINDINGS: CT CHEST FINDINGS Cardiovascular: Cardiomegaly and moderate coronary artery calcifications noted. Aortic valvular calcifications are present. There is no evidence of thoracic aortic aneurysm. No pericardial effusion identified. Mediastinum/Nodes: No mediastinal hematoma or enlarged lymph nodes. Lungs/Pleura: A contusion within the lateral right upper lobe noted. A small right pleural effusion is present. There is no evidence of pneumothorax. A 1 cm left lower lobe nodule (image 36) is present. No other pulmonary abnormalities identified. Musculoskeletal: Fractures of the lateral right 1st-8th ribs noted. CT ABDOMEN PELVIS FINDINGS Hepatobiliary: A cyst within the central liver is identified without other hepatic abnormality. The gallbladder is  unremarkable. There is no evidence of biliary dilatation. Pancreas: Unremarkable Spleen: Unremarkable Adrenals/Urinary Tract: The kidneys, adrenal glands and bladder are unremarkable except for small right renal cyst. Stomach/Bowel: Stomach is within normal limits. Appendix appears normal. No evidence of bowel wall thickening, distention, or inflammatory changes. Vascular/Lymphatic: No significant vascular findings are present. No enlarged abdominal or pelvic lymph nodes. Reproductive: Prostate unremarkable Other: No free fluid, focal collection or pneumoperitoneum. Musculoskeletal: An intertrochanteric fracture of the left femur with varus angulation is again identified. There appears to be a small hematoma just below the right inferior pubic ramus and may represent an avulsion injury. IMPRESSION: Fractures of the lateral right 1st-8th ribs with right upper lobe pulmonary contusion and small right pleural effusion. No evidence of pneumothorax. No evidence of acute injury within the abdomen. Left femoral intertrochanteric fracture with varus angulation. Question small hematoma just below the right inferior pubic ramus which may represent an avulsion injury. 1 cm left lower lobe pulmonary nodule. Consider one of the following in 3 months for both low-risk and high-risk individuals: (a) repeat chest CT, (b) follow-up PET-CT, or (c) tissue sampling. This recommendation follows the consensus statement: Guidelines for Management of Incidental Pulmonary Nodules Detected on CT Images: From the Fleischner Society 2017; Radiology 2017; 284:228-243. Electronically  Signed   By: Harmon Pier M.D.   On: 01/09/2017 15:48   Ct Cervical Spine Wo Contrast  Result Date: 01/09/2017 CLINICAL DATA:  Larey Seat off horse today, neck pain, right hip pain EXAM: CT HEAD WITHOUT CONTRAST CT CERVICAL SPINE WITHOUT CONTRAST TECHNIQUE: Multidetector CT imaging of the head and cervical spine was performed following the standard protocol without  intravenous contrast. Multiplanar CT image reconstructions of the cervical spine were also generated. COMPARISON:  None. FINDINGS: CT HEAD FINDINGS Brain: No intracranial hemorrhage, mass effect or midline shift. Mild cerebral atrophy. No acute cortical infarction. No mass lesion is noted on this unenhanced scan. Vascular: Atherosclerotic calcifications of carotid siphon. Atherosclerotic calcifications bilateral vertebral arteries. Skull: No skull fracture is noted. Sinuses/Orbits: No acute findings Other: None CT CERVICAL SPINE FINDINGS Alignment: There is mild anterolisthesis about 2.5 mm C4 on C5 vertebral body. Otherwise normal alignment. Skull base and vertebrae: Degenerative changes are noted C1-C2 articulation. Mild anterior spurring lower endplate of C2. Minimal anterior spurring lower endplate of C4. Mild to moderate anterior spurring upper and lower endplate of C5 and C6 vertebral body. Moderate anterior spurring upper endplate of C7 vertebral body. Mild posterior spurring lower endplate of C6 vertebral body. No acute fracture is noted. Soft tissues and spinal canal: No prevertebral soft tissue swelling. No definite spinal canal hematoma. Disc levels: There is mild disc space flattening at C4-C5 level. Mild disc space flattening at C4-C5 level. Moderate disc space flattening with some vacuum disc phenomenon at C6-C7 level. Minimal disc space flattening at C7 T1 level. Upper chest:  Lung apices are not visualized. Other: None IMPRESSION: 1. No acute intracranial abnormality.  Mild cerebral atrophy. 2. No cervical spine acute fracture. There is about 2.5 mm anterolisthesis C4 on C5 vertebral body. Multilevel mild degenerative changes as described above. No prevertebral soft tissue swelling. Cervical airway is patent. Electronically Signed   By: Natasha Mead M.D.   On: 01/09/2017 15:36   Ct Abdomen Pelvis W Contrast  Result Date: 01/09/2017 CLINICAL DATA:  66 year old male with acute chest, abdominal and  pelvic pain following fall off horse today. Initial encounter. EXAM: CT CHEST, ABDOMEN, AND PELVIS WITH CONTRAST TECHNIQUE: Multidetector CT imaging of the chest, abdomen and pelvis was performed following the standard protocol during bolus administration of intravenous contrast. CONTRAST:  100 cc intravenous Isovue-300 COMPARISON:  01/09/2017 chest and pelvis radiographs. FINDINGS: CT CHEST FINDINGS Cardiovascular: Cardiomegaly and moderate coronary artery calcifications noted. Aortic valvular calcifications are present. There is no evidence of thoracic aortic aneurysm. No pericardial effusion identified. Mediastinum/Nodes: No mediastinal hematoma or enlarged lymph nodes. Lungs/Pleura: A contusion within the lateral right upper lobe noted. A small right pleural effusion is present. There is no evidence of pneumothorax. A 1 cm left lower lobe nodule (image 36) is present. No other pulmonary abnormalities identified. Musculoskeletal: Fractures of the lateral right 1st-8th ribs noted. CT ABDOMEN PELVIS FINDINGS Hepatobiliary: A cyst within the central liver is identified without other hepatic abnormality. The gallbladder is unremarkable. There is no evidence of biliary dilatation. Pancreas: Unremarkable Spleen: Unremarkable Adrenals/Urinary Tract: The kidneys, adrenal glands and bladder are unremarkable except for small right renal cyst. Stomach/Bowel: Stomach is within normal limits. Appendix appears normal. No evidence of bowel wall thickening, distention, or inflammatory changes. Vascular/Lymphatic: No significant vascular findings are present. No enlarged abdominal or pelvic lymph nodes. Reproductive: Prostate unremarkable Other: No free fluid, focal collection or pneumoperitoneum. Musculoskeletal: An intertrochanteric fracture of the left femur with varus angulation is again identified. There  appears to be a small hematoma just below the right inferior pubic ramus and may represent an avulsion injury. IMPRESSION:  Fractures of the lateral right 1st-8th ribs with right upper lobe pulmonary contusion and small right pleural effusion. No evidence of pneumothorax. No evidence of acute injury within the abdomen. Left femoral intertrochanteric fracture with varus angulation. Question small hematoma just below the right inferior pubic ramus which may represent an avulsion injury. 1 cm left lower lobe pulmonary nodule. Consider one of the following in 3 months for both low-risk and high-risk individuals: (a) repeat chest CT, (b) follow-up PET-CT, or (c) tissue sampling. This recommendation follows the consensus statement: Guidelines for Management of Incidental Pulmonary Nodules Detected on CT Images: From the Fleischner Society 2017; Radiology 2017; 284:228-243. Electronically Signed   By: Harmon Pier M.D.   On: 01/09/2017 15:48   Dg Pelvis Portable  Result Date: 01/09/2017 CLINICAL DATA:  Fall from horse. EXAM: PORTABLE PELVIS 1-2 VIEWS COMPARISON:  None. FINDINGS: Single AP view of the pelvis. Femoral heads are located. Sacroiliac joints are symmetric. Comminuted intratrochanteric proximal left femur fracture with varus angulation. Vascular calcifications. IMPRESSION: Proximal left femur fracture. Electronically Signed   By: Jeronimo Greaves M.D.   On: 01/09/2017 13:53   Dg Chest Port 1 View  Result Date: 01/10/2017 CLINICAL DATA:  Pulmonary contusion.  Rib fractures. EXAM: PORTABLE CHEST 1 VIEW COMPARISON:  CT chest 01/09/2017.  Chest x-ray 01/09/2017 . FINDINGS: Multiple right-sided rib fractures are again noted. No pneumothorax noted. Mild adjacent right upper lobe contusion noted, this appears improved from prior CT of 01/09/2017. Reference is made to this prior CT report for discussion of small 1 cm nodule left lung base . Subtle pneumomediastinum cannot be completely excluded on today's exam. Stable cardiomegaly. IMPRESSION: 1. Multiple right side rib fractures are again noted. No pneumothorax noted. Mild adjacent right  upper lobe contusion noted, this appears improved from prior CT of 01/09/2017. 2. Subtle pneumomediastinum cannot be completely excluded on today's exam. Stable cardiomegaly. 3. Reference made to prior CT report of 01/09/2017 for discussion of small 1 cm pulmonary nodule left lung base. Electronically Signed   By: Maisie Fus  Register   On: 01/10/2017 07:22   Dg Chest Portable 1 View  Result Date: 01/09/2017 CLINICAL DATA:  Fall from horse. EXAM: PORTABLE CHEST 1 VIEW COMPARISON:  None. FINDINGS: Numerous leads and wires project over the chest. Limited, AP portable radiograph with apical lordotic positioning. Right-sided rib fracture or fractures identified superiorly and laterally. Midline trachea. Cardiomegaly accentuated by AP portable technique. Superior mediastinal soft tissue fullness is most likely due to AP portable technique and patient size. No pleural effusion or pneumothorax. No lobar consolidation. No congestive failure. No free intraperitoneal air. IMPRESSION: Right-sided rib fracture or fractures superiorly and laterally. No gross pneumothorax or other acute cardiopulmonary disease. Cardiomegaly without congestive failure. Electronically Signed   By: Jeronimo Greaves M.D.   On: 01/09/2017 13:52   Dg C-arm 1-60 Min  Result Date: 01/09/2017 CLINICAL DATA:  ORIF left proximal femur fracture EXAM: DG C-ARM 61-120 MIN; LEFT FEMUR 2 VIEWS COMPARISON:  Left femur radiographs from earlier today FINDINGS: Fluoroscopy time 1 minutes 49 seconds. Multiple nondiagnostic spot fluoroscopic intraoperative left femur radiographs demonstrate intramedullary rod with interlocking femoral neck pin transfixing the intertrochanteric left proximal femur fracture. IMPRESSION: Intraoperative fluoroscopic guidance for ORIF left proximal femur fracture . Electronically Signed   By: Delbert Phenix M.D.   On: 01/09/2017 23:09   Dg Femur Min 2 Views Left  Result Date: 01/09/2017 CLINICAL DATA:  ORIF left proximal femur fracture  EXAM: DG C-ARM 61-120 MIN; LEFT FEMUR 2 VIEWS COMPARISON:  Left femur radiographs from earlier today FINDINGS: Fluoroscopy time 1 minutes 49 seconds. Multiple nondiagnostic spot fluoroscopic intraoperative left femur radiographs demonstrate intramedullary rod with interlocking femoral neck pin transfixing the intertrochanteric left proximal femur fracture. IMPRESSION: Intraoperative fluoroscopic guidance for ORIF left proximal femur fracture . Electronically Signed   By: Delbert Phenix M.D.   On: 01/09/2017 23:09   Dg Femur Min 2 Views Left  Result Date: 01/09/2017 CLINICAL DATA:  Level 2 trauma, patient fell from horse Best obtainable due to patients condition EXAM: LEFT FEMUR 2 VIEWS COMPARISON:  None. FINDINGS: Acute intertrochanteric fracture of the proximal LEFT femur with varus angulation. Distal femur is normal. Atherosclerotic calcification noted. IMPRESSION: Intertrochanteric LEFT femur fracture. Electronically Signed   By: Genevive Bi M.D.   On: 01/09/2017 13:57    Anti-infectives: Anti-infectives    Start     Dose/Rate Route Frequency Ordered Stop   01/10/17 0000  ceFAZolin (ANCEF) IVPB 2g/100 mL premix     2 g 200 mL/hr over 30 Minutes Intravenous Every 6 hours 01/09/17 2018 01/10/17 0604   01/09/17 1415  Ampicillin-Sulbactam (UNASYN) 3 g in sodium chloride 0.9 % 100 mL IVPB  Status:  Discontinued     3 g 200 mL/hr over 30 Minutes Intravenous  Once 01/09/17 1410 01/09/17 1411       Assessment/Plan  Asthma DM  HTN  Fall from horse Right rib fractures 1-8 Right pulmonary contusion Small right pleural effusion Left intertrochanteric hip fracture S/P ORIF, Dr. Magnus Ivan, 4/15 - Up with therapy - WBAT left hip Left lower lobe pulmonary nodule  Dizziness: orthostatic VS pending  FEN: carb modified VTE: Lovenox  Plan: CXR this AM showed no PTX, improvement in RUL contusion, possible pneumomediastinum. Aggressive pulmonary toilet. Pain control. PT and OT pending     LOS: 1 day    Jerre Simon , [facility] 01/10/2017, 8:21 AM Pager: [phone] Consults: [phone] Mon-Fri 7:00 am-4:30 pm Sat-Sun 7:00 am-11:30 am
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
## 2017-01-10 NOTE — Op Note (Signed)
NAME:  Kevin Bonilla, Kevin Bonilla              ACCOUNT NO.:  MEDICAL RECORD NO.:  [id number]  LOCATION:                                 FACILITY:  PHYSICIAN:  Vanita Panda. Magnus Ivan, M.D.DATE OF BIRTH:  DATE OF PROCEDURE:  01/09/2017 DATE OF DISCHARGE:                              OPERATIVE REPORT   PREOPERATIVE DIAGNOSIS:  Displaced left hip intertrochanteric proximal femur fracture.  POSTOPERATIVE DIAGNOSIS:  Displaced left hip intertrochanteric proximal femur fracture.  PROCEDURE:  Open reduction internal fixation of left intertrochanteric hip fracture using intramedullary rod and hip screw construct.  IMPLANTS:  [employer] 9 x 360 femoral nail with a 105 mm lag screw.  SURGEON:  Kathryne Hitch MD.  ASSISTANT:  Richardean Canal, PA-C.  ANESTHESIA:  General.  ANTIBIOTICS:  2 g IV Ancef.  BLOOD LOSS:  Between 100 and 150 mL.  COMPLICATIONS:  None.  INDICATIONS:  Kevin Bonilla is a very pleasant 66 year old gentleman with some diabetes and COPD as well as high blood pressure.  He was attempting to ride his worse earlier today when he sustained a mechanical fall off the horse, landing awkwardly.  He sustained an injury to his left hip as well as his right chest wall sustaining rib fractures of ribs 1 through 8 and a lung contusion.  He did not have pneumothorax.  He had significant pain and deformity of his left hip. He was brought to the [facility] Emergency Room via EMS and found to have a complex displaced intertrochanteric hip fracture, left hip, as well as right-sided rib fractures.  It was recommended he undergo surgical stabilization of the left hip.  He was seen by trauma surgeon, Dr. Donell Beers, and deemed clear for surgical intervention.  A thorough discussion of risks and benefits was had with him and his wife and informed consent was obtained.  PROCEDURE DESCRIPTION:  After informed consent was obtained, appropriate left hip was marked.  He was brought to the  operating room and general anesthesia was obtained while he was on the stretcher.  He was then placed supine on the fracture table with the perineal post in place and the left operative leg in an in-line skeletal traction, the right hip in an abduction stirrup and pillow, flexed and abducted out of the view and field with appropriate padding in the popliteal area.  There was a perineal post in place as well.  With direct in-line traction and internal rotation, we were able to obtain a closed adequate reduction of the fracture.  This was assessed under direct fluoroscopy to get the reduction prior to prepping and draping.  We were able to keep the femoral nails sterile in its box and placed it on top of the femur to get an idea of the diameter and length.  We chose a 9 x 360 femoral nail from [employer].  We then prepped his left hip area with DuraPrep and sterile drapes.  Time-out was called and he was identified as correct patient and correct left hip.  We then made an incision just proximal to the greater trochanter.  We dissected down the tip of the greater trochanter, and holding the fracture reduced as possible, placed a temporary guide  pin in antegrade fashion.  We then used initiating reamer to open up the femoral canal after assessing the placement under direct fluoroscopy.  We then placed a long guide rod to have it reamed due to his tight canal, we reamed from 9 mm and 5 mm increments up to a 12.  We then placed our 9 x 360 femoral nail in an antegrade fashion easily down the femoral canal.  Using the outrigger guide, we then made a separate lateral incision and placed a temporary guide pin from the lateral cortex of the femur traversing the fracture into a good position of the femoral head and neck.  We then took a measurement of this and drilled for a 95 mm lag screw.  We drilled to that depth and placed a lag screw without difficulty.  We let some traction off the leg and  then placed a compression component.  We got good tight fit.  We then removed all instrumentation and put the hip through internal and external rotation under fluoroscopy and removed as a unit.  We then irrigated 2 small wounds with normal saline solution, closed the deep tissue with 0 Vicryl followed by 2-0 Vicryl subcutaneous tissue, interrupted staples on 2 skin incisions.  A well-padded sterile dressing was applied.  He was taken off the fracture table, awakened, extubated, and taken to the recovery room in stable condition.  All final counts were correct. There were no complications noted.  Of note, Richardean Canal, PA-C, assisted in the entire case.  His assistance was crucial for facilitating all aspects of this case.     Vanita Panda. Magnus Ivan, M.D.     CYB/MEDQ  D:  01/09/2017  T:  01/09/2017  Job:  [id number]

## 2017-01-11 ENCOUNTER — Encounter (HOSPITAL_COMMUNITY): Payer: Self-pay | Admitting: General Practice

## 2017-01-11 LAB — GLUCOSE, CAPILLARY: GLUCOSE-CAPILLARY: 137 mg/dL — AB (ref 65–99)

## 2017-01-11 LAB — BASIC METABOLIC PANEL
Anion gap: 6 (ref 5–15)
BUN: 17 mg/dL (ref 6–20)
CO2: 23 mmol/L (ref 22–32)
CREATININE: 1.19 mg/dL (ref 0.61–1.24)
Calcium: 8.4 mg/dL — ABNORMAL LOW (ref 8.9–10.3)
Chloride: 106 mmol/L (ref 101–111)
Glucose, Bld: 134 mg/dL — ABNORMAL HIGH (ref 65–99)
Potassium: 4.1 mmol/L (ref 3.5–5.1)
SODIUM: 135 mmol/L (ref 135–145)

## 2017-01-11 LAB — CBC
HCT: 29.5 % — ABNORMAL LOW (ref 39.0–52.0)
HEMOGLOBIN: 9.7 g/dL — AB (ref 13.0–17.0)
MCH: 29.1 pg (ref 26.0–34.0)
MCHC: 32.9 g/dL (ref 30.0–36.0)
MCV: 88.6 fL (ref 78.0–100.0)
PLATELETS: 169 10*3/uL (ref 150–400)
RBC: 3.33 MIL/uL — AB (ref 4.22–5.81)
RDW: 14.2 % (ref 11.5–15.5)
WBC: 10.3 10*3/uL (ref 4.0–10.5)

## 2017-01-11 MED ORDER — ALUM & MAG HYDROXIDE-SIMETH 200-200-20 MG/5ML PO SUSP
15.0000 mL | ORAL | Status: DC | PRN
  Administered 2017-01-11 (×2): 15 mL via ORAL
  Filled 2017-01-11 (×2): qty 30

## 2017-01-11 MED ORDER — TAMSULOSIN HCL 0.4 MG PO CAPS: 0.4000 mg | ORAL_CAPSULE | Freq: Every day | ORAL | Status: DC

## 2017-01-11 MED ORDER — KETOROLAC TROMETHAMINE 15 MG/ML IJ SOLN
15.0000 mg | Freq: Four times a day (QID) | INTRAMUSCULAR | Status: DC
  Administered 2017-01-11 (×2): 15 mg via INTRAVENOUS
  Filled 2017-01-11 (×2): qty 1

## 2017-01-11 MED ORDER — ACETAMINOPHEN 325 MG PO TABS
650.0000 mg | ORAL_TABLET | Freq: Four times a day (QID) | ORAL | Status: DC
  Administered 2017-01-11 – 2017-01-12 (×3): 650 mg via ORAL
  Filled 2017-01-11 (×3): qty 2

## 2017-01-11 MED ORDER — HYDROMORPHONE HCL 1 MG/ML IJ SOLN: 1.0000 mg | Freq: Four times a day (QID) | INTRAMUSCULAR | Status: DC | PRN

## 2017-01-11 MED ORDER — OXYCODONE HCL 5 MG PO TABS
10.0000 mg | ORAL_TABLET | ORAL | Status: DC | PRN
  Administered 2017-01-11 – 2017-01-12 (×2): 10 mg via ORAL
  Administered 2017-01-12: 15 mg via ORAL
  Filled 2017-01-11 (×2): qty 2
  Filled 2017-01-11: qty 3

## 2017-01-11 MED ORDER — TAMSULOSIN HCL 0.4 MG PO CAPS
0.4000 mg | ORAL_CAPSULE | Freq: Every day | ORAL | Status: DC
  Administered 2017-01-11 – 2017-01-12 (×2): 0.4 mg via ORAL
  Filled 2017-01-11 (×2): qty 1

## 2017-01-11 MED ORDER — KETOROLAC TROMETHAMINE 30 MG/ML IJ SOLN
30.0000 mg | Freq: Once | INTRAMUSCULAR | Status: AC
  Administered 2017-01-11: 30 mg via INTRAVENOUS
  Filled 2017-01-11: qty 1

## 2017-01-11 MED ORDER — METHOCARBAMOL 750 MG PO TABS
750.0000 mg | ORAL_TABLET | Freq: Three times a day (TID) | ORAL | Status: DC
  Administered 2017-01-11 – 2017-01-12 (×4): 750 mg via ORAL
  Filled 2017-01-11 (×3): qty 1
  Filled 2017-01-11: qty 2

## 2017-01-11 MED ORDER — BETHANECHOL CHLORIDE 10 MG PO TABS
10.0000 mg | ORAL_TABLET | Freq: Three times a day (TID) | ORAL | Status: DC
  Administered 2017-01-11 – 2017-01-12 (×4): 10 mg via ORAL
  Filled 2017-01-11 (×5): qty 1

## 2017-01-11 NOTE — Care Management Note (Signed)
Case Management Note  Patient Details  Name: Kevin Bonilla MRN: [id number] Date of Birth: 01-18-1951  Subjective/Objective:    Pt admitted on 01/09/17 s/p fall from horse with right rib fractures 1-8, right pulmonary contusion, small right pleural effusion, and left intertrochanteric hip fracture.  PTA, pt independent, lives with spouse.             Action/Plan: PT recommending SNF as of today; originally recommending HH follow up.  PT uncertain if wife can care for pt at home.  CSW to follow up for possible SNF for rehab.     Expected Discharge Date:  01/12/17               Expected Discharge Plan:   In-House Referral:     Discharge planning Services  CM Consult  Post Acute Care Choice:    Choice offered to:     DME Arranged:    DME Agency:     HH Arranged:    HH Agency:     Status of Service:  In process, will continue to follow  If discussed at Long Length of Stay Meetings, dates discussed:    Additional Comments:  Quintella Baton, RN, BSN  Trauma/Neuro ICU Case Manager [phone]

## 2017-01-11 NOTE — Progress Notes (Signed)
Occupational Therapy Treatment Patient Details Name: Kevin Bonilla MRN: [id number] DOB: 1951/03/12 Today's Date: 01/11/2017    History of present illness 66 yo male s/p fall from horse with R rib fxs and L IM nail WBAT PMH: COPD without oxygen   OT comments  Pt continues to require max (A) for LB adls at this time and will need to progress to bed transfers without rail / hob elevation. Pt does have recliner at home as option for sleeping. Pt insist on getting into his flat bed at home and could benefit from trail in gym on flat mat surface to simulate ( will need 2nd person to complete task). Pt demonstrates min (A) for 3n1 transfer at [facility] stime.   Follow Up Recommendations  Home health OT    Equipment Recommendations  Other (comment)    Recommendations for Other Services      Precautions / Restrictions Precautions Precautions: Fall Precaution Comments: pt reports baseline unsteadiness and LOB for a year with ~25 falls, no spinning sensation Restrictions Weight Bearing Restrictions: No LLE Weight Bearing: Weight bearing as tolerated       Mobility Bed Mobility Overal bed mobility: Needs Assistance Bed Mobility: Supine to Sit Rolling: Min assist   Supine to sit: Mod assist     General bed mobility comments: pt required hand held (A) to pull up from the R UE. Pt using HOB elevated and bed rail. pt advised that these features will not happen at home and pt has a recliner. Pt reports he plans to get in his bed but did not want to simulate his home bed at this time. Pt states "i got a nice bed and a big bed I can get side ways in it and get out but i can't do that in this one"  Transfers Overall transfer level: Needs assistance Equipment used: Rolling walker (2 wheeled) Transfers: Sit to/from Stand Sit to Stand: Min assist         General transfer comment: cues for hand placement and not to pull up on RW. pt able to power up from bed without (A)    Balance  Overall balance assessment: Needs assistance           Standing balance-Leahy Scale: Fair Standing balance comment: able to stand and release RW thi session in static standing                           ADL either performed or assessed with clinical judgement   ADL Overall ADL's : Needs assistance/impaired Eating/Feeding: Independent   Grooming: Wash/dry hands;Wash/dry face;Set up;Sitting Grooming Details (indicate cue type and reason): in chair             Lower Body Dressing: Maximal assistance Lower Body Dressing Details (indicate cue type and reason): pt states he couldnt do it before . he woud sit in the bottom of the tub and let water roll over it. he would lay back on the bed and pull his knees up to him to put on pants. Toilet Transfer: Minimal Dentist Details (indicate cue type and reason): pt requires cues for hand placement and to control descend to bed         Functional mobility during ADLs: Minimal assistance;Rolling walker General ADL Comments: Pt completed oob to chair transfer with basic transfer to simulate ability to use 3n1      Vision       Perception  Praxis      Cognition Arousal/Alertness: Awake/alert Behavior During Therapy: WFL for tasks assessed/performed Overall Cognitive Status: Within Functional Limits for tasks assessed                                          Exercises     Shoulder Instructions       General Comments ice applied to hip    Pertinent Vitals/ Pain       Pain Assessment: Faces Faces Pain Scale: Hurts little more Pain Location: right chest Pain Descriptors / Indicators: Aching;Sore Pain Intervention(s): Monitored during session;Repositioned;Premedicated before session;Ice applied  Home Living                                          Prior Functioning/Environment              Frequency  Min 2X/week        Progress Toward  Goals  OT Goals(current goals can now be found in the care plan section)  Progress towards OT goals: Progressing toward goals  Acute Rehab OT Goals Patient Stated Goal: to return home OT Goal Formulation: With patient Time For Goal Achievement: 01/24/17 Potential to Achieve Goals: Good ADL Goals Pt Will Perform Grooming: with supervision;standing Pt Will Perform Upper Body Bathing: with supervision;sitting Pt Will Transfer to Toilet: with min guard assist;ambulating;bedside commode Additional ADL Goal #1: Pt will complete tub transfer min (A) with seat   Plan Discharge plan remains appropriate    Co-evaluation                 End of Session Equipment Utilized During Treatment: Rolling walker  OT Visit Diagnosis: Unsteadiness on feet (R26.81)   Activity Tolerance Patient tolerated treatment well   Patient Left in chair;with call bell/phone within reach   Nurse Communication Mobility status;Precautions        Time: 9924-2683 OT Time Calculation (min): 20 min  Charges: OT General Charges $OT Visit: 1 Procedure OT Treatments $Therapeutic Activity: 8-22 mins   [name]   OTR/L Pager: [phone] Office: [phone] .    [name] 01/11/2017, 2:21 PM

## 2017-01-11 NOTE — Progress Notes (Addendum)
Physical Therapy Treatment Patient Details Name: Kevin Bonilla MRN: [id number] DOB: Oct 11, 1950 Today's Date: 01/11/2017    History of Present Illness 66 yo male s/p fall from horse with R rib fxs and L IM nail WBAT PMH: COPD without oxygen    PT Comments    Pt with very limited mobility due to hip and rib pain. Pt requires mod assist for all mobility and unsure if wife can provide this level of assist at home as she was not present during session. Pt unable to walk more than 8 feet, taking shallow breaths and denied further mobility. Concerned for pt mobility should he return home at this level although he reports he can function with a W/C. ST-SNF would be beneficial to assist pt to achieve minguard level prior to return home should he D/C prior to achieving this level acutely. Will continue to follow with education for transfers, gait and HEP with pt encouraged to increase mobility with nursing assist.   Pt also noted that he has had increasing falls (25) in the last year with feeling of unsteadiness. Pt previously stated dizziness but with questions pt reports no spinning or lightheadedness, not positionally related and occurs with standing to walk.   HR 68, sats 99% on RA     Follow Up Recommendations  Supervision/Assistance - 24 hour;SNF     Equipment Recommendations  None recommended by PT    Recommendations for Other Services       Precautions / Restrictions Precautions Precautions: Fall Precaution Comments: pt reports baseline unsteadiness and LOB for a year with ~25 falls, no spinning sensation Restrictions Weight Bearing Restrictions: Yes LLE Weight Bearing: Weight bearing as tolerated    Mobility  Bed Mobility Overal bed mobility: Needs Assistance Bed Mobility: Supine to Sit Rolling: Min assist   Supine to sit: Mod assist     General bed mobility comments: cues for sequence with HHA for RUE to reach toward left, assist to move LLE and to elevate trunk  fully from surface, mod assist to scoot  Transfers Overall transfer level: Needs assistance   Transfers: Sit to/from Stand Sit to Stand: Min assist;+2 safety/equipment;From elevated surface         General transfer comment: cues for hand placement and RW support as pt stating he can't put his hands on the bed and pulling up on RW  Ambulation/Gait Ambulation/Gait assistance: Min assist;+2 safety/equipment Ambulation Distance (Feet): 8 Feet Assistive device: Rolling walker (2 wheeled) Gait Pattern/deviations: Step-to pattern;Trunk flexed;Narrow base of support;Shuffle   Gait velocity interpretation: Below normal speed for age/gender General Gait Details: cues for sequence and RW use with assist to advance LLE and pt scooting RLE to advance foot with gait, chair to follow and pt denied increased distance due to difficulty breathing and chest p0ain   Stairs            Wheelchair Mobility    Modified Rankin (Stroke Patients Only)       Balance Overall balance assessment: Needs assistance   Sitting balance-Leahy Scale: Good       Standing balance-Leahy Scale: Poor                              Cognition Arousal/Alertness: Awake/alert Behavior During Therapy: WFL for tasks assessed/performed Overall Cognitive Status: Within Functional Limits for tasks assessed  Exercises General Exercises - Lower Extremity Long Arc Quad: AROM;AAROM;Left;Right;Seated;10 reps (AAROM LLE) Hip Flexion/Marching: AROM;AAROM;Right;Left;Seated;10 reps (AAROM LLE)    General Comments        Pertinent Vitals/Pain Pain Score: 7  Pain Location: right chest Pain Descriptors / Indicators: Aching;Sore Pain Intervention(s): Limited activity within patient's tolerance;Repositioned;Monitored during session    Home Living                      Prior Function            PT Goals (current goals can now be found in  the care plan section) Progress towards PT goals: Progressing toward goals    Frequency    Min 5X/week      PT Plan Discharge plan needs to be updated    Co-evaluation             End of Session Equipment Utilized During Treatment: Gait belt Activity Tolerance: Patient limited by pain Patient left: in chair;with call bell/phone within reach;with chair alarm set;with nursing/sitter in room Nurse Communication: Mobility status;Precautions PT Visit Diagnosis: History of falling (Z91.81);Difficulty in walking, not elsewhere classified (R26.2);Pain Pain - Right/Left: Left Pain - part of body: Hip     Time: 0905-0929 PT Time Calculation (min) (ACUTE ONLY): 24 min  Charges:  $Gait Training: 8-22 mins $Therapeutic Exercise: 8-22 mins                    G Codes:       Delaney Meigs, PT [phone]   Hlee Fringer B Louise Rawson 01/11/2017, 10:11 AM

## 2017-01-11 NOTE — Progress Notes (Signed)
Trauma Service Note  Subjective: Patient wants to go home today, but still taking predominantly IV pain medications, not getting out of bed much and has a Foley.  Objective: Vital signs in last 24 hours: Temp:  [98.3 F (36.8 C)-98.8 F (37.1 C)] 98.5 F (36.9 C) (04/17 0733) Pulse Rate:  [52-78] 59 (04/17 0733) Resp:  [13-22] 13 (04/17 0733) BP: (115-159)/(46-67) 157/50 (04/17 0733) SpO2:  [96 %-99 %] 99 % (04/17 0733) Last BM Date: 01/09/17  Intake/Output from previous day: 04/16 0701 - 04/17 0700 In: 920 [P.O.:320; I.V.:600] Out: 3025 [Urine:3025] Intake/Output this shift: Total I/O In: -  Out: 400 [Urine:400]  General: No distress.  Wants to go home  Lungs: Clear to auscultation.  No chest wall crepitance.  Abd: Soft, good bowel sounds.  Extremities: No changes  Neuro: Intact  Lab Results: CBC   Recent Labs  01/10/17 0217 01/11/17 0216  WBC 11.0* 10.3  HGB 9.8* 9.7*  HCT 30.4* 29.5*  PLT 176 169   BMET  Recent Labs  01/10/17 0217 01/11/17 0216  NA 134* 135  K 3.9 4.1  CL 103 106  CO2 23 23  GLUCOSE 129* 134*  BUN 21* 17  CREATININE 1.17 1.19  CALCIUM 8.4* 8.4*   PT/INR  Recent Labs  01/09/17 1256  LABPROT 13.5  INR 1.03   ABG No results for input(s): PHART, HCO3 in the last 72 hours.  Invalid input(s): PCO2, PO2  Studies/Results: Ct Head Wo Contrast  Result Date: 01/09/2017 CLINICAL DATA:  Larey Seat off horse today, neck pain, right hip pain EXAM: CT HEAD WITHOUT CONTRAST CT CERVICAL SPINE WITHOUT CONTRAST TECHNIQUE: Multidetector CT imaging of the head and cervical spine was performed following the standard protocol without intravenous contrast. Multiplanar CT image reconstructions of the cervical spine were also generated. COMPARISON:  None. FINDINGS: CT HEAD FINDINGS Brain: No intracranial hemorrhage, mass effect or midline shift. Mild cerebral atrophy. No acute cortical infarction. No mass lesion is noted on this unenhanced scan.  Vascular: Atherosclerotic calcifications of carotid siphon. Atherosclerotic calcifications bilateral vertebral arteries. Skull: No skull fracture is noted. Sinuses/Orbits: No acute findings Other: None CT CERVICAL SPINE FINDINGS Alignment: There is mild anterolisthesis about 2.5 mm C4 on C5 vertebral body. Otherwise normal alignment. Skull base and vertebrae: Degenerative changes are noted C1-C2 articulation. Mild anterior spurring lower endplate of C2. Minimal anterior spurring lower endplate of C4. Mild to moderate anterior spurring upper and lower endplate of C5 and C6 vertebral body. Moderate anterior spurring upper endplate of C7 vertebral body. Mild posterior spurring lower endplate of C6 vertebral body. No acute fracture is noted. Soft tissues and spinal canal: No prevertebral soft tissue swelling. No definite spinal canal hematoma. Disc levels: There is mild disc space flattening at C4-C5 level. Mild disc space flattening at C4-C5 level. Moderate disc space flattening with some vacuum disc phenomenon at C6-C7 level. Minimal disc space flattening at C7 T1 level. Upper chest:  Lung apices are not visualized. Other: None IMPRESSION: 1. No acute intracranial abnormality.  Mild cerebral atrophy. 2. No cervical spine acute fracture. There is about 2.5 mm anterolisthesis C4 on C5 vertebral body. Multilevel mild degenerative changes as described above. No prevertebral soft tissue swelling. Cervical airway is patent. Electronically Signed   By: Natasha Mead M.D.   On: 01/09/2017 15:36   Ct Chest W Contrast  Result Date: 01/09/2017 CLINICAL DATA:  66 year old male with acute chest, abdominal and pelvic pain following fall off horse today. Initial encounter. EXAM: CT CHEST,  ABDOMEN, AND PELVIS WITH CONTRAST TECHNIQUE: Multidetector CT imaging of the chest, abdomen and pelvis was performed following the standard protocol during bolus administration of intravenous contrast. CONTRAST:  100 cc intravenous Isovue-300  COMPARISON:  01/09/2017 chest and pelvis radiographs. FINDINGS: CT CHEST FINDINGS Cardiovascular: Cardiomegaly and moderate coronary artery calcifications noted. Aortic valvular calcifications are present. There is no evidence of thoracic aortic aneurysm. No pericardial effusion identified. Mediastinum/Nodes: No mediastinal hematoma or enlarged lymph nodes. Lungs/Pleura: A contusion within the lateral right upper lobe noted. A small right pleural effusion is present. There is no evidence of pneumothorax. A 1 cm left lower lobe nodule (image 36) is present. No other pulmonary abnormalities identified. Musculoskeletal: Fractures of the lateral right 1st-8th ribs noted. CT ABDOMEN PELVIS FINDINGS Hepatobiliary: A cyst within the central liver is identified without other hepatic abnormality. The gallbladder is unremarkable. There is no evidence of biliary dilatation. Pancreas: Unremarkable Spleen: Unremarkable Adrenals/Urinary Tract: The kidneys, adrenal glands and bladder are unremarkable except for small right renal cyst. Stomach/Bowel: Stomach is within normal limits. Appendix appears normal. No evidence of bowel wall thickening, distention, or inflammatory changes. Vascular/Lymphatic: No significant vascular findings are present. No enlarged abdominal or pelvic lymph nodes. Reproductive: Prostate unremarkable Other: No free fluid, focal collection or pneumoperitoneum. Musculoskeletal: An intertrochanteric fracture of the left femur with varus angulation is again identified. There appears to be a small hematoma just below the right inferior pubic ramus and may represent an avulsion injury. IMPRESSION: Fractures of the lateral right 1st-8th ribs with right upper lobe pulmonary contusion and small right pleural effusion. No evidence of pneumothorax. No evidence of acute injury within the abdomen. Left femoral intertrochanteric fracture with varus angulation. Question small hematoma just below the right inferior pubic  ramus which may represent an avulsion injury. 1 cm left lower lobe pulmonary nodule. Consider one of the following in 3 months for both low-risk and high-risk individuals: (a) repeat chest CT, (b) follow-up PET-CT, or (c) tissue sampling. This recommendation follows the consensus statement: Guidelines for Management of Incidental Pulmonary Nodules Detected on CT Images: From the Fleischner Society 2017; Radiology 2017; 284:228-243. Electronically Signed   By: Harmon Pier M.D.   On: 01/09/2017 15:48   Ct Cervical Spine Wo Contrast  Result Date: 01/09/2017 CLINICAL DATA:  Larey Seat off horse today, neck pain, right hip pain EXAM: CT HEAD WITHOUT CONTRAST CT CERVICAL SPINE WITHOUT CONTRAST TECHNIQUE: Multidetector CT imaging of the head and cervical spine was performed following the standard protocol without intravenous contrast. Multiplanar CT image reconstructions of the cervical spine were also generated. COMPARISON:  None. FINDINGS: CT HEAD FINDINGS Brain: No intracranial hemorrhage, mass effect or midline shift. Mild cerebral atrophy. No acute cortical infarction. No mass lesion is noted on this unenhanced scan. Vascular: Atherosclerotic calcifications of carotid siphon. Atherosclerotic calcifications bilateral vertebral arteries. Skull: No skull fracture is noted. Sinuses/Orbits: No acute findings Other: None CT CERVICAL SPINE FINDINGS Alignment: There is mild anterolisthesis about 2.5 mm C4 on C5 vertebral body. Otherwise normal alignment. Skull base and vertebrae: Degenerative changes are noted C1-C2 articulation. Mild anterior spurring lower endplate of C2. Minimal anterior spurring lower endplate of C4. Mild to moderate anterior spurring upper and lower endplate of C5 and C6 vertebral body. Moderate anterior spurring upper endplate of C7 vertebral body. Mild posterior spurring lower endplate of C6 vertebral body. No acute fracture is noted. Soft tissues and spinal canal: No prevertebral soft tissue swelling.  No definite spinal canal hematoma. Disc levels: There is mild disc  space flattening at C4-C5 level. Mild disc space flattening at C4-C5 level. Moderate disc space flattening with some vacuum disc phenomenon at C6-C7 level. Minimal disc space flattening at C7 T1 level. Upper chest:  Lung apices are not visualized. Other: None IMPRESSION: 1. No acute intracranial abnormality.  Mild cerebral atrophy. 2. No cervical spine acute fracture. There is about 2.5 mm anterolisthesis C4 on C5 vertebral body. Multilevel mild degenerative changes as described above. No prevertebral soft tissue swelling. Cervical airway is patent. Electronically Signed   By: Natasha Mead M.D.   On: 01/09/2017 15:36   Ct Abdomen Pelvis W Contrast  Result Date: 01/09/2017 CLINICAL DATA:  66 year old male with acute chest, abdominal and pelvic pain following fall off horse today. Initial encounter. EXAM: CT CHEST, ABDOMEN, AND PELVIS WITH CONTRAST TECHNIQUE: Multidetector CT imaging of the chest, abdomen and pelvis was performed following the standard protocol during bolus administration of intravenous contrast. CONTRAST:  100 cc intravenous Isovue-300 COMPARISON:  01/09/2017 chest and pelvis radiographs. FINDINGS: CT CHEST FINDINGS Cardiovascular: Cardiomegaly and moderate coronary artery calcifications noted. Aortic valvular calcifications are present. There is no evidence of thoracic aortic aneurysm. No pericardial effusion identified. Mediastinum/Nodes: No mediastinal hematoma or enlarged lymph nodes. Lungs/Pleura: A contusion within the lateral right upper lobe noted. A small right pleural effusion is present. There is no evidence of pneumothorax. A 1 cm left lower lobe nodule (image 36) is present. No other pulmonary abnormalities identified. Musculoskeletal: Fractures of the lateral right 1st-8th ribs noted. CT ABDOMEN PELVIS FINDINGS Hepatobiliary: A cyst within the central liver is identified without other hepatic abnormality. The  gallbladder is unremarkable. There is no evidence of biliary dilatation. Pancreas: Unremarkable Spleen: Unremarkable Adrenals/Urinary Tract: The kidneys, adrenal glands and bladder are unremarkable except for small right renal cyst. Stomach/Bowel: Stomach is within normal limits. Appendix appears normal. No evidence of bowel wall thickening, distention, or inflammatory changes. Vascular/Lymphatic: No significant vascular findings are present. No enlarged abdominal or pelvic lymph nodes. Reproductive: Prostate unremarkable Other: No free fluid, focal collection or pneumoperitoneum. Musculoskeletal: An intertrochanteric fracture of the left femur with varus angulation is again identified. There appears to be a small hematoma just below the right inferior pubic ramus and may represent an avulsion injury. IMPRESSION: Fractures of the lateral right 1st-8th ribs with right upper lobe pulmonary contusion and small right pleural effusion. No evidence of pneumothorax. No evidence of acute injury within the abdomen. Left femoral intertrochanteric fracture with varus angulation. Question small hematoma just below the right inferior pubic ramus which may represent an avulsion injury. 1 cm left lower lobe pulmonary nodule. Consider one of the following in 3 months for both low-risk and high-risk individuals: (a) repeat chest CT, (b) follow-up PET-CT, or (c) tissue sampling. This recommendation follows the consensus statement: Guidelines for Management of Incidental Pulmonary Nodules Detected on CT Images: From the Fleischner Society 2017; Radiology 2017; 284:228-243. Electronically Signed   By: Harmon Pier M.D.   On: 01/09/2017 15:48   Dg Pelvis Portable  Result Date: 01/09/2017 CLINICAL DATA:  Fall from horse. EXAM: PORTABLE PELVIS 1-2 VIEWS COMPARISON:  None. FINDINGS: Single AP view of the pelvis. Femoral heads are located. Sacroiliac joints are symmetric. Comminuted intratrochanteric proximal left femur fracture with  varus angulation. Vascular calcifications. IMPRESSION: Proximal left femur fracture. Electronically Signed   By: Jeronimo Greaves M.D.   On: 01/09/2017 13:53   Dg Chest Port 1 View  Result Date: 01/10/2017 CLINICAL DATA:  Pulmonary contusion.  Rib fractures. EXAM: PORTABLE CHEST  1 VIEW COMPARISON:  CT chest 01/09/2017.  Chest x-ray 01/09/2017 . FINDINGS: Multiple right-sided rib fractures are again noted. No pneumothorax noted. Mild adjacent right upper lobe contusion noted, this appears improved from prior CT of 01/09/2017. Reference is made to this prior CT report for discussion of small 1 cm nodule left lung base . Subtle pneumomediastinum cannot be completely excluded on today's exam. Stable cardiomegaly. IMPRESSION: 1. Multiple right side rib fractures are again noted. No pneumothorax noted. Mild adjacent right upper lobe contusion noted, this appears improved from prior CT of 01/09/2017. 2. Subtle pneumomediastinum cannot be completely excluded on today's exam. Stable cardiomegaly. 3. Reference made to prior CT report of 01/09/2017 for discussion of small 1 cm pulmonary nodule left lung base. Electronically Signed   By: Maisie Fus  Register   On: 01/10/2017 07:22   Dg Chest Portable 1 View  Result Date: 01/09/2017 CLINICAL DATA:  Fall from horse. EXAM: PORTABLE CHEST 1 VIEW COMPARISON:  None. FINDINGS: Numerous leads and wires project over the chest. Limited, AP portable radiograph with apical lordotic positioning. Right-sided rib fracture or fractures identified superiorly and laterally. Midline trachea. Cardiomegaly accentuated by AP portable technique. Superior mediastinal soft tissue fullness is most likely due to AP portable technique and patient size. No pleural effusion or pneumothorax. No lobar consolidation. No congestive failure. No free intraperitoneal air. IMPRESSION: Right-sided rib fracture or fractures superiorly and laterally. No gross pneumothorax or other acute cardiopulmonary disease.  Cardiomegaly without congestive failure. Electronically Signed   By: Jeronimo Greaves M.D.   On: 01/09/2017 13:52   Dg C-arm 1-60 Min  Result Date: 01/09/2017 CLINICAL DATA:  ORIF left proximal femur fracture EXAM: DG C-ARM 61-120 MIN; LEFT FEMUR 2 VIEWS COMPARISON:  Left femur radiographs from earlier today FINDINGS: Fluoroscopy time 1 minutes 49 seconds. Multiple nondiagnostic spot fluoroscopic intraoperative left femur radiographs demonstrate intramedullary rod with interlocking femoral neck pin transfixing the intertrochanteric left proximal femur fracture. IMPRESSION: Intraoperative fluoroscopic guidance for ORIF left proximal femur fracture . Electronically Signed   By: Delbert Phenix M.D.   On: 01/09/2017 23:09   Dg Femur Min 2 Views Left  Result Date: 01/09/2017 CLINICAL DATA:  ORIF left proximal femur fracture EXAM: DG C-ARM 61-120 MIN; LEFT FEMUR 2 VIEWS COMPARISON:  Left femur radiographs from earlier today FINDINGS: Fluoroscopy time 1 minutes 49 seconds. Multiple nondiagnostic spot fluoroscopic intraoperative left femur radiographs demonstrate intramedullary rod with interlocking femoral neck pin transfixing the intertrochanteric left proximal femur fracture. IMPRESSION: Intraoperative fluoroscopic guidance for ORIF left proximal femur fracture . Electronically Signed   By: Delbert Phenix M.D.   On: 01/09/2017 23:09   Dg Femur Min 2 Views Left  Result Date: 01/09/2017 CLINICAL DATA:  Level 2 trauma, patient fell from horse Best obtainable due to patients condition EXAM: LEFT FEMUR 2 VIEWS COMPARISON:  None. FINDINGS: Acute intertrochanteric fracture of the proximal LEFT femur with varus angulation. Distal femur is normal. Atherosclerotic calcification noted. IMPRESSION: Intertrochanteric LEFT femur fracture. Electronically Signed   By: Genevive Bi M.D.   On: 01/09/2017 13:57    Anti-infectives: Anti-infectives    Start     Dose/Rate Route Frequency Ordered Stop   01/10/17 0000  ceFAZolin  (ANCEF) IVPB 2g/100 mL premix     2 g 200 mL/hr over 30 Minutes Intravenous Every 6 hours 01/09/17 2018 01/10/17 0604   01/09/17 1415  Ampicillin-Sulbactam (UNASYN) 3 g in sodium chloride 0.9 % 100 mL IVPB  Status:  Discontinued     3 g 200  mL/hr over 30 Minutes Intravenous  Once 01/09/17 1410 01/09/17 1411      Assessment/Plan: s/p Procedure(s): INTRAMEDULLARY (IM) NAIL FEMORAL Transfer to the floor.  Flomax and Urecholine DC IVF Oral pain medications along with Toradol  LOS: 2 days   Marta Lamas. Gae Bon, MD, FACS [phone] Trauma Surgeon 01/11/2017

## 2017-01-12 MED ORDER — TAMSULOSIN HCL 0.4 MG PO CAPS: 0.4000 mg | ORAL_CAPSULE | Freq: Every day | ORAL | 1 refills | Status: DC

## 2017-01-12 MED ORDER — OXYCODONE HCL 10 MG PO TABS: 10.0000 mg | ORAL_TABLET | Freq: Four times a day (QID) | ORAL | 0 refills | Status: DC | PRN

## 2017-01-12 MED ORDER — METHOCARBAMOL 750 MG PO TABS: 750.0000 mg | ORAL_TABLET | Freq: Three times a day (TID) | ORAL | 0 refills | Status: DC | PRN

## 2017-01-12 MED ORDER — DOCUSATE SODIUM 100 MG PO CAPS: 100.0000 mg | ORAL_CAPSULE | Freq: Two times a day (BID) | ORAL | 0 refills | Status: DC

## 2017-01-12 NOTE — Discharge Summary (Signed)
[facility] Discharge Summary   Patient ID: Kevin Bonilla MRN: [id number] DOB/AGE: 64/28/1952 64 y.o.  Admit date: 01/09/2017 Discharge date: 01/12/2017  Admitting Diagnosis: Fall Closed fracture of multiple right side rib fractures Left hip fracture Right pulmonary contusion  Discharge Diagnosis Patient Active Problem List   Diagnosis Date Noted  . Closed displaced intertrochanteric fracture of left femur (HCC), multiple right rib fractures 01/09/2017  . Fall from horse 01/09/2017    Consultants Doneen Poisson MD - orthopedics  Imaging: CT chest and abdomen pelvis w contrast 01/09/17: Fractures of the lateral right 1st-8th ribs with right upper lobe pulmonary contusion and small right pleural effusion. No evidence of pneumothorax.  No evidence of acute injury within the abdomen. Left femoral intertrochanteric fracture with varus angulation. Question small hematoma just below the right inferior pubic ramus which may represent an avulsion injury. 1 cm left lower lobe pulmonary nodule.  CT head wo contrast 01/09/17: 1. No acute intracranial abnormality.  Mild cerebral atrophy. 2. No cervical spine acute fracture. There is about 2.5 mm anterolisthesis C4 on C5 vertebral body. Multilevel mild degenerative changes as described above. No prevertebral soft tissue swelling. Cervical airway is patent.  DG femur min 2 views left 01/09/17: Intertrochanteric LEFT femur fracture.  DG femur min 2 views left 01/09/17: Intraoperative fluoroscopic guidance for ORIF left proximal femur fracture.  DG chest port 1 view 01/10/17: 1. Multiple right side rib fractures are again noted. No pneumothorax noted. Mild adjacent right upper lobe contusion noted, this appears improved from prior CT of 01/09/2017. 2. Subtle pneumomediastinum cannot be completely excluded on today's exam. Stable cardiomegaly. 3. Reference made to prior CT report of 01/09/2017 for discussion of  small 1 cm pulmonary nodule left lung base.  Procedures Dr. Magnus Ivan (01/09/17) - Open reduction internal fixation of left intertrochanteric hip fracture using intramedullary rod and hip screw construct  Hospital Course:  Kevin Bonilla is a 66yo male who [facility]/15 as a level 2 trauma after a fall from horse. There was no LOC.  Workup showed Right rib fractures 1-8, Right pulmonary contusion, Small right pleural effusion, Left intertrochanteric hip fracture. Patient was admitted to stepdown due to number of rib fractures and shortness of breath. Orthopedics was consulted and performed the above procedure. Tolerated procedure well. Patient was recommend WBAT LLE. Chest x-ray remained stable and patient's shortness of breath improved. He was transferred to the floor. Diet was advanced as tolerated.  He did suffer from urinary retention requiring Foley catheter placement; patient was started on Flomax and Urecholine. Catheter was successfully removed on 4/18 and patient able to void. On 4/18 the patient was voiding well, tolerating diet, ambulating well, pain well controlled, vital signs stable, incisions c/d/i and felt stable for discharge home.  He will continue Flomax at home and follow up with PCP. He will also follow up with orthopedics in 2 weeks.    I have personally reviewed the patients medication history on the [place] controlled substance database.   Physical Exam: Gen:  Alert, NAD, pleasant Card:  RRR, no M/G/R heard Pulm:  CTAB, no W/R/R, effort normal Abd: obese, soft, NT/ND, +BS, no HSM, no hernia Ext:  Left hip dressing in place.   Allergies as of 01/12/2017      Reactions   Lisinopril Nausea Only, Other (See Comments)   Panic attacks      Medication List    TAKE these medications   busPIRone 30 MG tablet Commonly known as:  BUSPAR Take 15 mg by mouth 2 (two) times daily.   clonazePAM 1 MG tablet Commonly known as:  KLONOPIN Take 1 mg by mouth 2 (two) times  daily as needed for anxiety.   COMBIVENT RESPIMAT 20-100 MCG/ACT Aers respimat Generic drug:  Ipratropium-Albuterol Inhale 1 puff into the lungs 4 (four) times daily as needed for wheezing.   DILTIAZEM CD 180 MG 24 hr capsule Generic drug:  diltiazem Take 360 mg by mouth daily.   docusate sodium 100 MG capsule Commonly known as:  COLACE Take 1 capsule (100 mg total) by mouth 2 (two) times daily.   gabapentin 300 MG capsule Commonly known as:  NEURONTIN Take 600 capsules by mouth at bedtime.   ibuprofen 200 MG tablet Commonly known as:  ADVIL,MOTRIN Take 200-400 mg by mouth every 6 (six) hours as needed for headache (pain).   indomethacin 50 MG capsule Commonly known as:  INDOCIN Take 50 mg by mouth 2 (two) times daily with a meal.   metFORMIN 500 MG tablet Commonly known as:  GLUCOPHAGE Take 500 mg by mouth 2 (two) times daily.   methocarbamol 750 MG tablet Commonly known as:  ROBAXIN Take 1 tablet (750 mg total) by mouth 3 (three) times daily as needed for muscle spasms.   Oxycodone HCl 10 MG Tabs Take 1-1.5 tablets (10-15 mg total) by mouth every 6 (six) hours as needed for breakthrough pain ((for MODERATE breakthrough pain)).   oxyCODONE-acetaminophen 5-325 MG tablet Commonly known as:  PERCOCET/ROXICET Take 1 tablet by mouth 2 (two) times daily as needed (pain).   predniSONE 5 MG tablet Commonly known as:  DELTASONE Take 5 mg by mouth daily with breakfast.   predniSONE 10 MG tablet Commonly known as:  DELTASONE Take 10-30 tablets by mouth See admin instructions. Take 40 mg by mouth daily for 3 days, take 30 mg by mouth today (01/09/17) at bedtime for 3 days, then take 20 mg daily at bedtime for 3 days, then take 10 mg daily at bedtime for 3 days   SYMBICORT 160-4.5 MCG/ACT inhaler Generic drug:  budesonide-formoterol Inhale 2 puffs into the lungs 2 (two) times daily.   tamsulosin 0.4 MG Caps capsule Commonly known as:  FLOMAX Take 1 capsule (0.4 mg total) by  mouth daily after breakfast. Start taking on:  01/13/2017   valsartan-hydrochlorothiazide 320-25 MG tablet Commonly known as:  DIOVAN-HCT Take 1 tablet by mouth daily.   VITAMIN B-12 PO Take 1 tablet by mouth daily.        Follow-up Information    Kathryne Hitch, MD. Schedule an appointment as soon as possible for a visit in 2 week(s).   Specialty:  Orthopedic Surgery Contact information: [address] [place] [address] [phone]        [facility]. Call.   Why:  as needed Contact information: [address] [place] [address] [phone]          Signed: Edson Snowball, [facility] Surgery 01/12/2017, 11:26 AM Pager: [phone] Consults: [phone] Mon-Fri 7:00 am-4:30 pm Sat-Sun 7:00 am-11:30 am

## 2017-01-12 NOTE — Progress Notes (Signed)
Patient ID: Kevin Bonilla, male   DOB: 02-Dec-1950, 66 y.o.   MRN: [id number] No acute changes.  Patient requested foley placement.  Has been ambulating.  Wants to go home today.  Left hip dressing changed.
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
## 2017-01-12 NOTE — Progress Notes (Signed)
Physical Therapy Treatment Patient Details Name: Kevin Bonilla MRN: [id number] DOB: July 02, 1951 Today's Date: 01/12/2017    History of Present Illness 66 yo male s/p fall from horse with R rib fxs and L IM nail WBAT PMH: COPD without oxygen    PT Comments    Patient continues to be limited by pain and ambulates with shuffling gait. Pt required min / mod A for all mobility. Given pt's history of frequent falls, current mobility level, and likely decreased caregiver assistance during the day continue to recommend ST-SNF for further skilled PT services to maximize independence and safety with mobility.    Follow Up Recommendations  Supervision/Assistance - 24 hour;SNF     Equipment Recommendations  None recommended by PT    Recommendations for Other Services       Precautions / Restrictions Precautions Precautions: Fall Precaution Comments: pt reports baseline unsteadiness and LOB for a year with ~25 falls, no spinning sensation Restrictions Weight Bearing Restrictions: Yes LLE Weight Bearing: Weight bearing as tolerated    Mobility  Bed Mobility Overal bed mobility: Needs Assistance Bed Mobility: Supine to Sit     Supine to sit: Mod assist     General bed mobility comments: mod A to elevate trunk into sitting and scoot hips toward EOB; cues for sequencing and technique and pt able to assist L LE with leg lifter and/or R LE to bring to and lower from EOB  Transfers Overall transfer level: Needs assistance Equipment used: Rolling walker (2 wheeled) Transfers: Sit to/from Stand Sit to Stand: Min assist         General transfer comment: cues for safe hand placement and use of AD however pt insists on pulling up on RW; assist to stabilize RW  Ambulation/Gait Ambulation/Gait assistance: Min assist;+2 safety/equipment Ambulation Distance (Feet): 10 Feet Assistive device: Rolling walker (2 wheeled) Gait Pattern/deviations: Step-to pattern;Trunk flexed;Narrow base  of support;Shuffle Gait velocity: decreased   General Gait Details: cues for sequencing, safe use of AD, and L heel strike; pt with shuffling gait and tends to leave L LE behind due to pain and limited L hip flexion   Stairs            Wheelchair Mobility    Modified Rankin (Stroke Patients Only)       Balance                                            Cognition Arousal/Alertness: Awake/alert Behavior During Therapy: WFL for tasks assessed/performed Overall Cognitive Status: Within Functional Limits for tasks assessed                                        Exercises      General Comments        Pertinent Vitals/Pain Pain Assessment: Faces Faces Pain Scale: Hurts even more Pain Location: right chest and L LE Pain Descriptors / Indicators: Aching;Sore;Grimacing;Guarding Pain Intervention(s): Limited activity within patient's tolerance;Monitored during session;Premedicated before session;Repositioned    Home Living                      Prior Function            PT Goals (current goals can now be found in the care plan section) Acute Rehab  PT Goals Patient Stated Goal: to return home Progress towards PT goals: Progressing toward goals    Frequency    Min 5X/week      PT Plan Current plan remains appropriate    Co-evaluation             End of Session Equipment Utilized During Treatment: Gait belt Activity Tolerance: Patient limited by pain Patient left: in chair;with call bell/phone within reach Nurse Communication: Mobility status;Precautions PT Visit Diagnosis: History of falling (Z91.81);Difficulty in walking, not elsewhere classified (R26.2);Pain Pain - Right/Left: Left Pain - part of body: Hip     Time: 9147-8295 PT Time Calculation (min) (ACUTE ONLY): 22 min  Charges:  $Gait Training: 8-22 mins                    G Codes:       Erline Levine, PTA Pager: [phone]      Carolynne Edouard 01/12/2017, 11:06 AM

## 2017-01-12 NOTE — Progress Notes (Signed)
Patient ID: Kevin Bonilla, male   DOB: June 12, 1951, 65 y.o.   MRN: [id number]  [facility] Surgery Progress Note  3 Days Post-Op  Subjective: CC- fall from horse Patient states that he wants the foley catheter out and wants to go home. Pain control is better today. Tolerating regular diet and he had a good BM this morning.  Objective: Vital signs in last 24 hours: Temp:  [98.2 F (36.8 C)-99.1 F (37.3 C)] 98.6 F (37 C) (04/18 0516) Pulse Rate:  [68-80] 75 (04/18 0516) Resp:  [17] 17 (04/18 0516) BP: (153-191)/(51-60) 158/60 (04/18 0516) SpO2:  [98 %-99 %] 98 % (04/18 0843) Last BM Date: 01/09/17  Intake/Output from previous day: 04/17 0701 - 04/18 0700 In: 990 [P.O.:240] Out: 1250 [Urine:1250] Intake/Output this shift: No intake/output data recorded.  PE: Gen:  Alert, NAD, pleasant Card:  RRR, no M/G/R heard Pulm:  CTAB, no W/R/R, effort normal Abd: obese, soft, NT/ND, +BS, no HSM, no hernia Ext:  Left hip dressing in place.   Lab Results:   Recent Labs  01/10/17 0217 01/11/17 0216  WBC 11.0* 10.3  HGB 9.8* 9.7*  HCT 30.4* 29.5*  PLT 176 169   BMET  Recent Labs  01/10/17 0217 01/11/17 0216  NA 134* 135  K 3.9 4.1  CL 103 106  CO2 23 23  GLUCOSE 129* 134*  BUN 21* 17  CREATININE 1.17 1.19  CALCIUM 8.4* 8.4*   PT/INR  Recent Labs  01/09/17 1256  LABPROT 13.5  INR 1.03   CMP     Component Value Date/Time   NA 135 01/11/2017 0216   K 4.1 01/11/2017 0216   CL 106 01/11/2017 0216   CO2 23 01/11/2017 0216   GLUCOSE 134 (H) 01/11/2017 0216   BUN 17 01/11/2017 0216   CREATININE 1.19 01/11/2017 0216   CALCIUM 8.4 (L) 01/11/2017 0216   PROT 6.2 (L) 01/09/2017 1256   ALBUMIN 4.0 01/09/2017 1256   AST 25 01/09/2017 1256   ALT 23 01/09/2017 1256   ALKPHOS 53 01/09/2017 1256   BILITOT 0.5 01/09/2017 1256   GFRNONAA >60 01/11/2017 0216   GFRAA >60 01/11/2017 0216   Lipase  No results found for: LIPASE     Studies/Results: No  results found.  Anti-infectives: Anti-infectives    Start     Dose/Rate Route Frequency Ordered Stop   01/10/17 0000  ceFAZolin (ANCEF) IVPB 2g/100 mL premix     2 g 200 mL/hr over 30 Minutes Intravenous Every 6 hours 01/09/17 2018 01/10/17 0604   01/09/17 1415  Ampicillin-Sulbactam (UNASYN) 3 g in sodium chloride 0.9 % 100 mL IVPB  Status:  Discontinued     3 g 200 mL/hr over 30 Minutes Intravenous  Once 01/09/17 1410 01/09/17 1411       Assessment/Plan Fall from horse Right rib fractures 1-8 - IS/pulmonary toilet Right pulmonary contusion Small right pleural effusion Left intertrochanteric hip fracture - s/p ORIF Dr. Magnus Ivan 4/15. WBAT LLE. f/u Dr. Magnus Ivan in 2 weeks Left lower lobe pulmonary nodule - f/u with PCP for repeat scan Urinary retention - foley in place and patient started on urecholine/flomax HTN - home meds DM Chronic pain  ID - ancef perioperative FEN - heart healthy VTE - SCDs, lovenox  Plan - D/c foley. If able to void independently can be discharged later today.   LOS: 3 days    Edson Snowball , [facility] Surgery 01/12/2017, 9:19 AM Pager: [phone] Consults: [phone] Mon-Fri 7:00 am-4:30  pm Sat-Sun 7:00 am-11:30 am

## 2017-01-12 NOTE — Discharge Instructions (Signed)
Increase your activities as comfort allows. Up with assistance. You can get your dressing on left hip wet in the shower. This dressing can stay on until your ortho follow-up. Expect left hip and thigh swelling. Continue using incentive spirometer at home Rib fractures often take at least 12 weeks to heal.

## 2017-01-12 NOTE — Progress Notes (Signed)
Patient To be discharged to home. Discharge instructions and prescriptions reviewed with patient. Patient stated understanding. No IV access. Family here for transportation

## 2017-01-27 ENCOUNTER — Ambulatory Visit (INDEPENDENT_AMBULATORY_CARE_PROVIDER_SITE_OTHER): Payer: BLUE CROSS/BLUE SHIELD | Admitting: Physician Assistant

## 2017-01-27 ENCOUNTER — Ambulatory Visit (INDEPENDENT_AMBULATORY_CARE_PROVIDER_SITE_OTHER): Payer: BLUE CROSS/BLUE SHIELD

## 2017-01-27 DIAGNOSIS — M898X5 Other specified disorders of bone, thigh: Secondary | ICD-10-CM | POA: Diagnosis not present

## 2017-01-27 DIAGNOSIS — S72142D Displaced intertrochanteric fracture of left femur, subsequent encounter for closed fracture with routine healing: Secondary | ICD-10-CM

## 2017-01-27 MED ORDER — OXYCODONE-ACETAMINOPHEN 5-325 MG PO TABS: 1.0000 | ORAL_TABLET | Freq: Two times a day (BID) | ORAL | 0 refills | Status: DC | PRN

## 2017-01-27 NOTE — Progress Notes (Signed)
Mr. Hayashida returns today 18 days status post IM nailing left hip fracture. He is overall doing well. He still has some difficulty due to gate/ balance issues that he had prior to his to surgery and the recent injury. He states he has spoken with his primary care physician about this but really has found no cause for his gait/ balance and dizziness. Regards to his left hip he is able to ambulate and has been working on tractors at his home. He is not using a walker but  uses some type of motor vehicle to get to the tractors and then able to hang on tractors and ambulate about a traction holding on to them. Denies any fevers chills. No Chest Pain.   Left hip surgical incisions healing well no signs of infection. He has minimal pain with internal and external rotation of the left hip. Left calf supple nontender. He is able to dorsiflex plantarflex his ankle.  Radiographs :AP lateral views of the left femur 4 views shows patient to be status post open reduction internal fixation with an IM nail. There is no hardware failure. The fracture is well aligned. Left hip well located. No other acute fractures.  Plan: Weightbearing as tolerated left leg. Staples removed Steri-Strips applied. He is able to get incision wet in the shower. We'll see him back in 1 month sooner if there is any questions or concerns.

## 2017-01-28 ENCOUNTER — Telehealth (INDEPENDENT_AMBULATORY_CARE_PROVIDER_SITE_OTHER): Payer: Self-pay | Admitting: Orthopaedic Surgery

## 2017-01-28 NOTE — Telephone Encounter (Signed)
Would not advise him to mow yet

## 2017-01-28 NOTE — Telephone Encounter (Signed)
Please advise 

## 2017-01-28 NOTE — Telephone Encounter (Signed)
PT HAS HAD HIP SURGERY AND WANTS TO KNOW IF HE CAN MOW HIS YARD.Marland KitchenPLEASE ADVISE.  [phone]

## 2017-01-31 NOTE — Telephone Encounter (Signed)
Patient aware not to do so just yet

## 2017-02-03 ENCOUNTER — Other Ambulatory Visit (INDEPENDENT_AMBULATORY_CARE_PROVIDER_SITE_OTHER): Payer: Self-pay

## 2017-02-03 ENCOUNTER — Other Ambulatory Visit (INDEPENDENT_AMBULATORY_CARE_PROVIDER_SITE_OTHER): Payer: Self-pay | Admitting: Orthopaedic Surgery

## 2017-02-03 ENCOUNTER — Telehealth (INDEPENDENT_AMBULATORY_CARE_PROVIDER_SITE_OTHER): Payer: Self-pay

## 2017-02-03 MED ORDER — METHOCARBAMOL 750 MG PO TABS: 750.0000 mg | ORAL_TABLET | Freq: Three times a day (TID) | ORAL | 0 refills | Status: DC | PRN

## 2017-02-03 NOTE — Telephone Encounter (Signed)
Requests refill of Robaxin 

## 2017-02-03 NOTE — Telephone Encounter (Signed)
Hopefully I just sent it in to [facility]

## 2017-02-18 ENCOUNTER — Telehealth (INDEPENDENT_AMBULATORY_CARE_PROVIDER_SITE_OTHER): Payer: Self-pay | Admitting: *Deleted

## 2017-02-22 ENCOUNTER — Ambulatory Visit (INDEPENDENT_AMBULATORY_CARE_PROVIDER_SITE_OTHER): Payer: BLUE CROSS/BLUE SHIELD | Admitting: Physician Assistant

## 2017-02-22 ENCOUNTER — Ambulatory Visit (INDEPENDENT_AMBULATORY_CARE_PROVIDER_SITE_OTHER): Payer: BLUE CROSS/BLUE SHIELD

## 2017-02-22 DIAGNOSIS — M79605 Pain in left leg: Secondary | ICD-10-CM | POA: Diagnosis not present

## 2017-02-22 DIAGNOSIS — S72142D Displaced intertrochanteric fracture of left femur, subsequent encounter for closed fracture with routine healing: Secondary | ICD-10-CM

## 2017-02-22 MED ORDER — OXYCODONE-ACETAMINOPHEN 5-325 MG PO TABS: 1.0000 | ORAL_TABLET | Freq: Four times a day (QID) | ORAL | 0 refills | Status: DC | PRN

## 2017-02-22 NOTE — Progress Notes (Signed)
[name] returns today follow-up of his left intertrochanteric fracture. Having increased pain in the hip. He states he is not able to afford outpatient therapy or home therapy. Requesting refill on his oxycodone.  Left if he has excellent range of motion of the hip today without pain. Left calf supple nontender.  Left femur AP lateral: No hardware failure. Good consolidation about the intertrochanteric fracture. No other fractures identified.  Plan:Refill on his oxycodone was given today. Exercises for lower extremity strengthening given. AP needs a higher medication in regards to pain medicines today to talk to his primary care physician is been providing chronic pain meds for him. Did discuss with him that the infected besides 2 have his primary care physician fill his pain meds then he needs to let us know so he only gets this from 1 provider. Follow-up in one month check his progress/lack of and for AP left hip and lateral left hip radiographs.

## 2017-02-24 ENCOUNTER — Ambulatory Visit (INDEPENDENT_AMBULATORY_CARE_PROVIDER_SITE_OTHER): Payer: BLUE CROSS/BLUE SHIELD | Admitting: Orthopaedic Surgery

## 2017-03-10 ENCOUNTER — Other Ambulatory Visit: Payer: Self-pay | Admitting: Family Medicine

## 2017-03-11 ENCOUNTER — Other Ambulatory Visit: Payer: Self-pay | Admitting: Family Medicine

## 2017-03-18 ENCOUNTER — Telehealth (INDEPENDENT_AMBULATORY_CARE_PROVIDER_SITE_OTHER): Payer: Self-pay | Admitting: Orthopaedic Surgery

## 2017-03-18 NOTE — Telephone Encounter (Signed)
Patient called needing a Rx for 2 arm crutches. The number to contact patient is [phone]

## 2017-03-19 ENCOUNTER — Observation Stay
Admission: EM | Admit: 2017-03-19 | Discharge: 2017-03-20 | Discharge status: Against Medical Advice | Payer: BLUE CROSS/BLUE SHIELD | Attending: Family Medicine | Admitting: Family Medicine

## 2017-03-19 ENCOUNTER — Emergency Department: Payer: BLUE CROSS/BLUE SHIELD

## 2017-03-19 DIAGNOSIS — R4701 Aphasia: Secondary | ICD-10-CM | POA: Diagnosis present

## 2017-03-19 DIAGNOSIS — Z888 Allergy status to other drugs, medicaments and biological substances status: Secondary | ICD-10-CM | POA: Diagnosis not present

## 2017-03-19 DIAGNOSIS — E1140 Type 2 diabetes mellitus with diabetic neuropathy, unspecified: Secondary | ICD-10-CM | POA: Diagnosis not present

## 2017-03-19 DIAGNOSIS — N528 Other male erectile dysfunction: Secondary | ICD-10-CM | POA: Insufficient documentation

## 2017-03-19 DIAGNOSIS — Z5321 Procedure and treatment not carried out due to patient leaving prior to being seen by health care provider: Secondary | ICD-10-CM | POA: Insufficient documentation

## 2017-03-19 DIAGNOSIS — F419 Anxiety disorder, unspecified: Secondary | ICD-10-CM | POA: Insufficient documentation

## 2017-03-19 DIAGNOSIS — G4700 Insomnia, unspecified: Secondary | ICD-10-CM | POA: Insufficient documentation

## 2017-03-19 DIAGNOSIS — I10 Essential (primary) hypertension: Secondary | ICD-10-CM | POA: Insufficient documentation

## 2017-03-19 DIAGNOSIS — M1710 Unilateral primary osteoarthritis, unspecified knee: Secondary | ICD-10-CM | POA: Diagnosis not present

## 2017-03-19 DIAGNOSIS — J45909 Unspecified asthma, uncomplicated: Secondary | ICD-10-CM | POA: Diagnosis not present

## 2017-03-19 DIAGNOSIS — R51 Headache: Secondary | ICD-10-CM | POA: Diagnosis not present

## 2017-03-19 DIAGNOSIS — G459 Transient cerebral ischemic attack, unspecified: Secondary | ICD-10-CM | POA: Diagnosis present

## 2017-03-19 DIAGNOSIS — F429 Obsessive-compulsive disorder, unspecified: Secondary | ICD-10-CM | POA: Insufficient documentation

## 2017-03-19 DIAGNOSIS — Z87891 Personal history of nicotine dependence: Secondary | ICD-10-CM | POA: Insufficient documentation

## 2017-03-19 DIAGNOSIS — E781 Pure hyperglyceridemia: Secondary | ICD-10-CM | POA: Diagnosis not present

## 2017-03-19 DIAGNOSIS — K219 Gastro-esophageal reflux disease without esophagitis: Secondary | ICD-10-CM | POA: Diagnosis not present

## 2017-03-19 LAB — COMPREHENSIVE METABOLIC PANEL
ALBUMIN: 4.4 g/dL (ref 3.5–5.0)
ALT: 17 U/L (ref 17–63)
ANION GAP: 10 (ref 5–15)
AST: 21 U/L (ref 15–41)
Alkaline Phosphatase: 106 U/L (ref 38–126)
BILIRUBIN TOTAL: 0.7 mg/dL (ref 0.3–1.2)
BUN: 14 mg/dL (ref 6–20)
CO2: 25 mmol/L (ref 22–32)
CREATININE: 1.06 mg/dL (ref 0.61–1.24)
Calcium: 9.5 mg/dL (ref 8.9–10.3)
Chloride: 95 mmol/L — ABNORMAL LOW (ref 101–111)
GFR calc Af Amer: >60 mL/min (ref >60)
GFR calc non Af Amer: >60 mL/min (ref >60)
GLUCOSE: 117 mg/dL — AB (ref 65–99)
Potassium: 3.9 mmol/L (ref 3.5–5.1)
Sodium: 130 mmol/L — ABNORMAL LOW (ref 135–145)
TOTAL PROTEIN: 7.3 g/dL (ref 6.5–8.1)

## 2017-03-19 LAB — CBC
HCT: 43.7 % (ref 40.0–52.0)
HEMOGLOBIN: 15 g/dL (ref 13.0–18.0)
MCH: 28.3 pg (ref 26.0–34.0)
MCHC: 34.2 g/dL (ref 32.0–36.0)
MCV: 82.8 fL (ref 80.0–100.0)
Platelets: 237 10*3/uL (ref 150–440)
RBC: 5.27 MIL/uL (ref 4.40–5.90)
RDW: 15.2 % — ABNORMAL HIGH (ref 11.5–14.5)
WBC: 11.2 10*3/uL — AB (ref 3.8–10.6)

## 2017-03-19 LAB — DIFFERENTIAL
Basophils Absolute: 0.2 10*3/uL — ABNORMAL HIGH (ref 0–0.1)
Basophils Relative: 2 %
EOS PCT: 4 %
Eosinophils Absolute: 0.4 10*3/uL (ref 0–0.7)
LYMPHS ABS: 2.5 10*3/uL (ref 1.0–3.6)
LYMPHS PCT: 23 %
MONO ABS: 1 10*3/uL (ref 0.2–1.0)
Monocytes Relative: 9 %
NEUTROS ABS: 7.1 10*3/uL — AB (ref 1.4–6.5)
NEUTROS PCT: 64 %

## 2017-03-19 LAB — TROPONIN I: Troponin I: <0.03 ng/mL (ref <0.03)

## 2017-03-19 LAB — APTT: aPTT: 28 seconds (ref 24–36)

## 2017-03-19 LAB — PROTIME-INR
INR: 1.02
Prothrombin Time: 13.4 seconds (ref 11.4–15.2)

## 2017-03-19 LAB — GLUCOSE, CAPILLARY: Glucose-Capillary: 138 mg/dL — ABNORMAL HIGH (ref 65–99)

## 2017-03-19 MED ORDER — ASPIRIN 81 MG PO CHEW
324.0000 mg | CHEWABLE_TABLET | Freq: Once | ORAL | Status: AC
  Administered 2017-03-19: 324 mg via ORAL
  Filled 2017-03-19: qty 4

## 2017-03-19 NOTE — ED Notes (Signed)
Verbal report given to Onalee Hua, RN; pt taken to CT via stretcher by Darl Pikes, RN

## 2017-03-19 NOTE — ED Notes (Signed)
Code stroke was called to 333 per Dr. Mayford Knife order.
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
## 2017-03-19 NOTE — Progress Notes (Signed)
[facility] received a code stroke page for pt in ED Rm14. CH met with pt and wife who was at his bedside. Dc and nurse were assessing pt. Pt was alert and stated that dc's changed his medications that is what caused stroke. Pt was calmed but talked talkative. [place] provided spiritual care and presence.    03/19/17 2300  Clinical Encounter Type  Visited With Patient and family together  Visit Type Initial;Code;ED;Trauma  Referral From Nurse  Consult/Referral To Chaplain  Spiritual Encounters  Spiritual Needs Emotional;Other (Comment)

## 2017-03-19 NOTE — H&P (Signed)
Called by EDP to admit patient for TIA, rule out CVA however on my arrival to the room patient was adamant about leaving the hospital AGAINST MEDICAL ADVICE. I discussed the risks, benefits and alternatives to inpatient treatment and evaluation. I explained to the patient the risk of noncompliance including, transient strokes which could worsen or develop into a CVA or even death. I advised him of the proposed treatment plan. However patient was insistent upon leaving to go home. He states he does have an appointment with his cardiologist on Tuesday for a carotid Doppler and echocardiogram which he states he will keep. He was instructed to follow-up with his primary care doctor as soon as possible and to return to the emergency department or seek immediate medical attention for any recurrent, worsening or severe symptoms. He expressed understanding of the risks of noncompliance with the proposed medical management of his symptoms. He accepted the risk of leaving the hospital AGAINST MEDICAL ADVICE.

## 2017-03-19 NOTE — ED Triage Notes (Signed)
About 45-60 minutes ago pt had an episode of approximately 15 minutes where he was unable to speak or what was coming out was jumbled; pt was in the bathtub when symptoms started; c/o mild headache all day; currently speech is clear and pt says he can tell he's a little better; pt with history of bradycardia that at times causes dizziness, leading to falls; pt says he has not fallen in a few days; history of hip fracture surgery January 09, 2017 and pt has not been very active since; denies calf pain

## 2017-03-19 NOTE — ED Notes (Signed)
Redrew and resent blue top to lab

## 2017-03-20 NOTE — ED Notes (Signed)
Patient now wanting to go home, this RN told him about TIA's and the importance of staying and he was not changing his mind, Hugelmeyer MD was notified that patient wanted to leave AMA.  Patient stated he could not rest in a hospital.  I countered by saying that I understood the ER stretchers were uncomfortable and that he would be in a better bed soon.  Patient was bent on leaving.  He took his monitoring off and I had him promise to stay until Hugelmeyer could talk to him and he said he "would wait until 12:15 then I am leaving"

## 2017-03-20 NOTE — ED Notes (Signed)
AMA form signed by patient.  Patient and family encouraged to return or seek further medical assistance of symptoms return, verbalized understanding.

## 2017-03-20 NOTE — ED Notes (Signed)
Patient at desk in wheelchair with wife.  Patient states he needs his paper work so he can go home.

## 2017-03-21 NOTE — Telephone Encounter (Signed)
See below

## 2017-03-21 NOTE — Telephone Encounter (Signed)
Ok for script/order for arm crutches

## 2017-03-21 NOTE — Telephone Encounter (Signed)
Patient aware Rx ready at front desk  

## 2017-03-22 ENCOUNTER — Encounter (INDEPENDENT_AMBULATORY_CARE_PROVIDER_SITE_OTHER): Payer: Self-pay | Admitting: Orthopaedic Surgery

## 2017-03-22 ENCOUNTER — Telehealth (INDEPENDENT_AMBULATORY_CARE_PROVIDER_SITE_OTHER): Payer: Self-pay | Admitting: Orthopaedic Surgery

## 2017-03-22 ENCOUNTER — Ambulatory Visit (INDEPENDENT_AMBULATORY_CARE_PROVIDER_SITE_OTHER): Payer: BLUE CROSS/BLUE SHIELD | Admitting: Physician Assistant

## 2017-03-22 ENCOUNTER — Ambulatory Visit (INDEPENDENT_AMBULATORY_CARE_PROVIDER_SITE_OTHER): Payer: BLUE CROSS/BLUE SHIELD

## 2017-03-22 VITALS — Ht 69.0 in | Wt 224.0 lb

## 2017-03-22 DIAGNOSIS — M25552 Pain in left hip: Secondary | ICD-10-CM | POA: Diagnosis not present

## 2017-03-22 DIAGNOSIS — S72142D Displaced intertrochanteric fracture of left femur, subsequent encounter for closed fracture with routine healing: Secondary | ICD-10-CM

## 2017-03-22 NOTE — Telephone Encounter (Signed)
[facility] called asking for a ICD10 code for the crutches that was prescribed to the patient. CB # [phone]

## 2017-03-22 NOTE — Telephone Encounter (Signed)
I called and advised that the ICD 10 for the crutches S72.142D

## 2017-03-22 NOTE — Progress Notes (Signed)
[name] returns today status post IM nailing of a left intertrochanteric fracture 01/09/2017. He fell yesterday with direct impact on the hip. Continues to have pain with ambulation. He states that a walker gets hung on most things and that the crutches irritating his axillary region. We've written a prescription for him that he was picking up today for forearm crutches. He states that he had a mini stroke on Saturday with the ER, left AMA. States his blood pressure was getting low due to his Diltiazem . He states he seen his cardiologist recently and this was stopped. He has an appointment with physical therapy next week to work on strengthening his left leg.  Physical exam: Left hip good range of motion without significant pain.  Radiographs AP pelvis and lateral left hip: Intertrochanteric fractures well-healed. No hardware failure. No acute fractures otherwise  Plan: He'll work with physical therapy on range of motion strengthening left leg. Follow up with [place] on an as-needed basis. Prescription was given for forearm crutches.

## 2017-06-21 ENCOUNTER — Other Ambulatory Visit: Payer: Self-pay | Admitting: Internal Medicine

## 2017-06-21 DIAGNOSIS — R911 Solitary pulmonary nodule: Secondary | ICD-10-CM

## 2017-06-22 ENCOUNTER — Encounter (INDEPENDENT_AMBULATORY_CARE_PROVIDER_SITE_OTHER): Payer: Self-pay | Admitting: Orthopaedic Surgery

## 2017-06-22 ENCOUNTER — Ambulatory Visit (INDEPENDENT_AMBULATORY_CARE_PROVIDER_SITE_OTHER): Payer: BLUE CROSS/BLUE SHIELD | Admitting: Orthopaedic Surgery

## 2017-06-22 ENCOUNTER — Ambulatory Visit (INDEPENDENT_AMBULATORY_CARE_PROVIDER_SITE_OTHER): Payer: BLUE CROSS/BLUE SHIELD

## 2017-06-22 DIAGNOSIS — S72142D Displaced intertrochanteric fracture of left femur, subsequent encounter for closed fracture with routine healing: Secondary | ICD-10-CM

## 2017-06-22 DIAGNOSIS — M79605 Pain in left leg: Secondary | ICD-10-CM

## 2017-06-22 DIAGNOSIS — G8929 Other chronic pain: Secondary | ICD-10-CM | POA: Insufficient documentation

## 2017-06-22 DIAGNOSIS — M7062 Trochanteric bursitis, left hip: Secondary | ICD-10-CM | POA: Diagnosis not present

## 2017-06-22 MED ORDER — DICLOFENAC SODIUM 1 % TD GEL: 2.0000 g | Freq: Four times a day (QID) | TRANSDERMAL | 3 refills | Status: DC
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
## 2017-06-23 ENCOUNTER — Telehealth (INDEPENDENT_AMBULATORY_CARE_PROVIDER_SITE_OTHER): Payer: Self-pay | Admitting: Orthopaedic Surgery

## 2017-06-23 NOTE — Telephone Encounter (Signed)
Patient called stating that the Voltaren cream that was prescribed to him isn't covered by his insurance, would like something else that is covered thru insurance. Thank you. Cb # [id number]

## 2017-06-24 ENCOUNTER — Other Ambulatory Visit (INDEPENDENT_AMBULATORY_CARE_PROVIDER_SITE_OTHER): Payer: Self-pay

## 2017-06-24 ENCOUNTER — Telehealth (INDEPENDENT_AMBULATORY_CARE_PROVIDER_SITE_OTHER): Payer: Self-pay

## 2017-06-24 MED ORDER — DICLOFENAC SODIUM 2 % TD SOLN: 2.0000 | Freq: Two times a day (BID) | TRANSDERMAL | 2 refills | Status: DC

## 2017-06-24 NOTE — Telephone Encounter (Signed)
Ok for PT script to work on left hip trochanteric bursitis

## 2017-06-24 NOTE — Telephone Encounter (Signed)
Patient aware sending to[facility]

## 2017-06-24 NOTE — Telephone Encounter (Signed)
Patient wants a Rx for PT at [facility] in [place] for his tendonitis.

## 2017-06-27 ENCOUNTER — Telehealth (INDEPENDENT_AMBULATORY_CARE_PROVIDER_SITE_OTHER): Payer: Self-pay | Admitting: Orthopaedic Surgery

## 2017-06-27 NOTE — Telephone Encounter (Signed)
Re-faxed.

## 2017-06-27 NOTE — Telephone Encounter (Signed)
Patient called stating that the PT facility did not receive the PT order to start scheduling for PT.

## 2017-06-27 NOTE — Telephone Encounter (Signed)
Patient wants to know if he needs physical therapy, saw Magnus Ivan last week but it was not discussed.

## 2017-06-27 NOTE — Telephone Encounter (Signed)
Patient aware order was sent PT for scheduling

## 2017-06-28 ENCOUNTER — Telehealth (INDEPENDENT_AMBULATORY_CARE_PROVIDER_SITE_OTHER): Payer: Self-pay | Admitting: Orthopaedic Surgery

## 2017-06-28 ENCOUNTER — Ambulatory Visit: Admission: RE | Admit: 2017-06-28 | Payer: BLUE CROSS/BLUE SHIELD | Source: Ambulatory Visit

## 2017-06-28 NOTE — Telephone Encounter (Signed)
Please advise 

## 2017-06-28 NOTE — Telephone Encounter (Signed)
Pt needs med refill (He  was just calling to see if everything was ready)   [phone] #physical therapy in [place]

## 2017-06-28 NOTE — Telephone Encounter (Signed)
He can have either tramadol 50 mg 1-2 every 8 hours as needed for pain worse Tylenol 3 one to 2 every 8 hours as needed for pain #60 no refills.

## 2017-06-28 NOTE — Telephone Encounter (Signed)
Patient isn't asking for medication he is asking for Rx for PT. I let him know that we did fax this yesterday

## 2017-07-01 ENCOUNTER — Telehealth (INDEPENDENT_AMBULATORY_CARE_PROVIDER_SITE_OTHER): Payer: Self-pay | Admitting: Orthopaedic Surgery

## 2017-07-01 NOTE — Telephone Encounter (Signed)
I filled out script and placed at front for patient to pick up. Patient advised.

## 2017-07-01 NOTE — Telephone Encounter (Signed)
Patient called advised the Rx for (PT) for him was still not received. Patient advised will pick up the Rx himself. The number to contact patient is [phone]

## 2017-07-15 ENCOUNTER — Telehealth (INDEPENDENT_AMBULATORY_CARE_PROVIDER_SITE_OTHER): Payer: Self-pay | Admitting: Orthopaedic Surgery

## 2017-07-15 NOTE — Telephone Encounter (Signed)
Patient aware of the below message  

## 2017-07-15 NOTE — Telephone Encounter (Signed)
[facility]  [place]  [place], [place]  16-Nov-1950 Left Hip Surgery  Pt stated he is not pleased with his physical therapist

## 2017-07-15 NOTE — Telephone Encounter (Signed)
He can stop therapy

## 2017-07-15 NOTE — Telephone Encounter (Signed)
See below, does he even need to continue?

## 2017-07-20 ENCOUNTER — Ambulatory Visit (INDEPENDENT_AMBULATORY_CARE_PROVIDER_SITE_OTHER): Payer: BLUE CROSS/BLUE SHIELD | Admitting: Orthopaedic Surgery

## 2017-07-20 DIAGNOSIS — S72142D Displaced intertrochanteric fracture of left femur, subsequent encounter for closed fracture with routine healing: Secondary | ICD-10-CM | POA: Diagnosis not present
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
## 2017-09-07 ENCOUNTER — Ambulatory Visit (INDEPENDENT_AMBULATORY_CARE_PROVIDER_SITE_OTHER): Payer: BLUE CROSS/BLUE SHIELD | Admitting: Orthopaedic Surgery

## 2017-09-21 ENCOUNTER — Telehealth (INDEPENDENT_AMBULATORY_CARE_PROVIDER_SITE_OTHER): Payer: Self-pay | Admitting: Orthopaedic Surgery

## 2017-09-21 NOTE — Telephone Encounter (Signed)
done

## 2017-09-21 NOTE — Telephone Encounter (Signed)
Patient walked in needing X-ray of hip put on disc. Signed Medical release form 09/21/17.

## 2017-09-26 ENCOUNTER — Ambulatory Visit (INDEPENDENT_AMBULATORY_CARE_PROVIDER_SITE_OTHER): Payer: BLUE CROSS/BLUE SHIELD | Admitting: Orthopaedic Surgery

## 2017-10-03 ENCOUNTER — Encounter: Payer: Self-pay | Admitting: Nurse Practitioner

## 2017-10-03 ENCOUNTER — Ambulatory Visit: Payer: BLUE CROSS/BLUE SHIELD | Attending: Nurse Practitioner | Admitting: Nurse Practitioner

## 2017-10-03 ENCOUNTER — Other Ambulatory Visit: Payer: Self-pay

## 2017-10-03 VITALS — BP 144/77 | HR 77 | Temp 97.6°F | Ht 69.0 in | Wt 200.0 lb

## 2017-10-03 DIAGNOSIS — F429 Obsessive-compulsive disorder, unspecified: Secondary | ICD-10-CM | POA: Diagnosis not present

## 2017-10-03 DIAGNOSIS — E1151 Type 2 diabetes mellitus with diabetic peripheral angiopathy without gangrene: Secondary | ICD-10-CM | POA: Diagnosis not present

## 2017-10-03 DIAGNOSIS — Z789 Other specified health status: Secondary | ICD-10-CM

## 2017-10-03 DIAGNOSIS — I10 Essential (primary) hypertension: Secondary | ICD-10-CM | POA: Diagnosis not present

## 2017-10-03 DIAGNOSIS — R569 Unspecified convulsions: Secondary | ICD-10-CM | POA: Insufficient documentation

## 2017-10-03 DIAGNOSIS — Z7982 Long term (current) use of aspirin: Secondary | ICD-10-CM | POA: Diagnosis not present

## 2017-10-03 DIAGNOSIS — M899 Disorder of bone, unspecified: Secondary | ICD-10-CM | POA: Insufficient documentation

## 2017-10-03 DIAGNOSIS — M546 Pain in thoracic spine: Secondary | ICD-10-CM

## 2017-10-03 DIAGNOSIS — N529 Male erectile dysfunction, unspecified: Secondary | ICD-10-CM | POA: Diagnosis not present

## 2017-10-03 DIAGNOSIS — M8580 Other specified disorders of bone density and structure, unspecified site: Secondary | ICD-10-CM | POA: Diagnosis not present

## 2017-10-03 DIAGNOSIS — Z8673 Personal history of transient ischemic attack (TIA), and cerebral infarction without residual deficits: Secondary | ICD-10-CM | POA: Insufficient documentation

## 2017-10-03 DIAGNOSIS — K219 Gastro-esophageal reflux disease without esophagitis: Secondary | ICD-10-CM | POA: Diagnosis not present

## 2017-10-03 DIAGNOSIS — M47814 Spondylosis without myelopathy or radiculopathy, thoracic region: Secondary | ICD-10-CM | POA: Insufficient documentation

## 2017-10-03 DIAGNOSIS — M545 Low back pain: Secondary | ICD-10-CM | POA: Diagnosis present

## 2017-10-03 DIAGNOSIS — G894 Chronic pain syndrome: Secondary | ICD-10-CM | POA: Insufficient documentation

## 2017-10-03 DIAGNOSIS — Z87442 Personal history of urinary calculi: Secondary | ICD-10-CM | POA: Insufficient documentation

## 2017-10-03 DIAGNOSIS — M1710 Unilateral primary osteoarthritis, unspecified knee: Secondary | ICD-10-CM | POA: Insufficient documentation

## 2017-10-03 DIAGNOSIS — Z79891 Long term (current) use of opiate analgesic: Secondary | ICD-10-CM | POA: Diagnosis not present

## 2017-10-03 DIAGNOSIS — Z79899 Other long term (current) drug therapy: Secondary | ICD-10-CM | POA: Diagnosis not present

## 2017-10-03 DIAGNOSIS — J45909 Unspecified asthma, uncomplicated: Secondary | ICD-10-CM | POA: Diagnosis not present

## 2017-10-03 DIAGNOSIS — M25552 Pain in left hip: Secondary | ICD-10-CM | POA: Insufficient documentation

## 2017-10-03 DIAGNOSIS — M7062 Trochanteric bursitis, left hip: Secondary | ICD-10-CM | POA: Diagnosis not present

## 2017-10-03 DIAGNOSIS — Z7984 Long term (current) use of oral hypoglycemic drugs: Secondary | ICD-10-CM | POA: Insufficient documentation

## 2017-10-03 DIAGNOSIS — G8929 Other chronic pain: Secondary | ICD-10-CM

## 2017-10-03 DIAGNOSIS — Z87891 Personal history of nicotine dependence: Secondary | ICD-10-CM | POA: Insufficient documentation

## 2017-10-03 NOTE — Patient Instructions (Signed)

## 2017-10-03 NOTE — Progress Notes (Signed)
Nursing Pain Medication Assessment:  Safety precautions to be maintained throughout the outpatient stay will include: orient to surroundings, keep bed in low position, maintain call bell within reach at all times, provide assistance with transfer out of bed and ambulation.  Medication Inspection Compliance: Pill count conducted under aseptic conditions, in front of the patient. Neither the pills nor the bottle was removed from the patient's sight at any time. Once count was completed pills were immediately returned to the patient in their original bottle.  Medication #1: Oxycodone/APAP Pill/Patch Count: 10 of 60 pills remain Pill/Patch Appearance: Markings consistent with prescribed medication Bottle Appearance: Standard pharmacy container. Clearly labeled. Filled Date: 70 / 23 / 2018 Last Medication intake:  Yesterday  Medication #2: Oxycodone ER (OxyContin) Pill/Patch Count: 29 of 30 pills remain Pill/Patch Appearance: Markings consistent with prescribed medication Bottle Appearance: Standard pharmacy container. Clearly labeled. Filled Date: 32 / 24 / 2018 Last Medication intake:  Yesterday

## 2017-10-03 NOTE — Progress Notes (Signed)
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 Readings from Last 4 Encounters:  10/03/17 200 lb (90.7 kg)  03/22/17 224 lb (101.6 kg)  03/19/17 224 lb (101.6 kg)  01/09/17 226 lb 3.1 oz (102.6 kg)  Psych/Mental status: Alert, oriented x 3 (person, place, & time)       Eyes: PERLA Respiratory: No evidence of acute respiratory distress  Cervical Spine Exam  Inspection: No masses, redness, or swelling Alignment: Symmetrical Functional ROM: Unrestricted ROM      Stability: No instability detected Muscle strength & Tone: Functionally intact Sensory: Unimpaired Palpation: No palpable anomalies              Upper Extremity (UE) Exam    Side: Right upper extremity  Side: Left upper extremity  Inspection: No masses, redness, swelling, or asymmetry. No contractures  Inspection: No masses, redness, swelling, or asymmetry. No contractures  Functional ROM: Unrestricted ROM          Functional ROM: Unrestricted ROM          Muscle strength & Tone: Functionally intact  Muscle strength & Tone: Functionally intact   Sensory: Unimpaired  Sensory: Unimpaired  Palpation: No palpable anomalies              Palpation: No palpable anomalies              Specialized Test(s): Deferred         Specialized Test(s): Deferred          Thoracic Spine Exam  Inspection: No masses, redness, or swelling Alignment: Asymmetric Functional ROM: Unrestricted ROM Stability: No instability detected Sensory: Unimpaired Muscle strength & Tone: No palpable anomalies  Lumbar Spine Exam  Inspection: No masses, redness, or swelling Alignment: Symmetrical Functional ROM: Unrestricted ROM      Stability: No instability detected Muscle strength & Tone: Functionally intact Sensory: Unimpaired Palpation: No palpable anomalies       Provocative Tests: Lumbar Hyperextension and rotation test: evaluation deferred today       Patrick's Maneuver: Negative                    Gait & Posture Assessment  Ambulation: Unassisted Gait: Relatively normal for age and  body habitus Posture: WNL   Lower Extremity Exam    Side: Right lower extremity  Side: Left lower extremity  Inspection: No masses, redness, swelling, or asymmetry. No contractures  Inspection: No masses, redness, swelling, or asymmetry. No contractures  Functional ROM: Unrestricted ROM          Functional ROM: Unrestricted ROM          Muscle strength & Tone: Functionally intact  Muscle strength & Tone: Functionally intact  Sensory: Unimpaired  Sensory: Unimpaired  Palpation: No palpable anomalies  Palpation: No palpable anomalies   Assessment  Primary Diagnosis & Pertinent Problem List: The primary encounter diagnosis was Chronic midline thoracic back pain. Diagnoses of Chronic left hip pain, Long term current use of opiate analgesic, Long term prescription opiate use, Disorder of bone, unspecified, Other long term (current) drug therapy, Other specified health status, and Chronic pain syndrome were also pertinent to this visit.  Visit Diagnosis: 1. Chronic midline  thoracic back pain   2. Chronic left hip pain   3. Long term current use of opiate analgesic   4. Long term prescription opiate use   5. Disorder of bone, unspecified   6. Other long term (current) drug therapy   7. Other specified health status   8. Chronic pain syndrome    Plan of Care  Initial treatment plan:  Please be advised that as per protocol, today's visit has been an evaluation only. We have not taken over the patient's controlled substance management.  Problem-specific plan: No problem-specific Assessment & Plan notes found for this encounter.  Ordered Lab-work, Procedure(s), Referral(s), & Consult(s): Orders Placed This Encounter  Procedures  . Compliance Drug Analysis, Ur  . Comp. Metabolic Panel (12)  . Magnesium  . Vitamin B12  . Sedimentation rate  . 25-Hydroxyvitamin D Lcms D2+D3  . C-reactive protein  . Ambulatory referral to Psychology   Pharmacotherapy: Medications ordered:  No orders of the defined types were placed in this encounter.  Medications administered during this visit: Wong L. Sames had no medications administered during this visit.   Pharmacotherapy under consideration:  Opioid Analgesics: The patient was informed that there is no guarantee that he would be a candidate for opioid analgesics. The decision will be made following CDC guidelines. This decision will be based on the results of diagnostic studies, as well as Mr. Lepak risk profile.  Membrane stabilizer: To be determined at a later time Muscle relaxant: To be determined at a later time NSAID: To be determined at a later time Other analgesic(s): To be determined at a later time   Interventional therapies under consideration: Mr. Kaczmarek was informed that there is no guarantee that he would be a candidate for interventional therapies. The decision will be based on the results of diagnostic studies, as well as Mr. Mierzwa risk profile.  Possible  procedure(s): Diagnostic thoracic facet nerve block Possible thoracic facet RFA Diagnostic left femoral nerve block    Provider-requested follow-up: Return for 2nd Visit, w/ Dr. Dossie Arbour, after [facility] eval.  No future appointments.  Primary Care Physician: Tracie Harrier, MD Location: [facility] Note by:  Date: 10/03/2017; Time: 3:49 PM  Pain Score Disclaimer: We use the NRS-11 scale. This is a self-reported, subjective measurement of pain severity with only modest accuracy. It is used primarily to identify changes within a particular patient. It must be understood that outpatient pain scales are significantly less accurate that those used for research, where they can be applied under  ideal controlled circumstances with minimal exposure to variables. In reality, the score is likely to be a combination of pain intensity and pain affect, where pain affect describes the degree of emotional arousal or changes in action readiness caused by the sensory experience of pain. Factors such as social and work situation, setting, emotional state, anxiety levels, expectation, and prior pain experience may influence pain perception and show large inter-individual differences that may also be affected by time variables.  Patient instructions provided during this appointment: Patient Instructions    ____________________________________________________________________________________________  Appointment Policy Summary  It is our goal and responsibility to provide the medical community with assistance in the evaluation and management of patients with chronic pain. Unfortunately our resources are limited. Because we do not have an unlimited amount of time, or available appointments, we are required to closely monitor and manage their use. The following rules exist to maximize their use:  Patient's responsibilities: 1. Punctuality:  At what time should I arrive? You should be  physically present in our office 30 minutes before your scheduled appointment. Your scheduled appointment is with your assigned healthcare provider. However, it takes 5-10 minutes to be "checked-in", and another 15 minutes for the nurses to do the admission. If you arrive to our office at the time you were given for your appointment, you will end up being at least 20-25 minutes late to your appointment with the provider. 2. Tardiness:  What happens if I arrive only a few minutes after my scheduled appointment time? You will need to reschedule your appointment. The cutoff is your appointment time. This is why it is so important that you arrive at least 30 minutes before that appointment. If you have an appointment scheduled for 10:00 AM and you arrive at 10:01, you will be required to reschedule your appointment.  3. Plan ahead:  Always assume that you will encounter traffic on your way in. Plan for it. If you are dependent on a driver, make sure they understand these rules and the need to arrive early. 4. Other appointments and responsibilities:  Avoid scheduling any other appointments before or after your pain clinic appointments.  5. Be prepared:  Write down everything that you need to discuss with your healthcare provider and give this information to the admitting nurse. Write down the medications that you will need refilled. Bring your pills and bottles (even the empty ones), to all of your appointments, except for those where a procedure is scheduled. 6. No children or pets:  Find someone to take care of them. It is not appropriate to bring them in. 7. Scheduling changes:  We request "advanced notification" of any changes or cancellations. 8. Advanced notification:  Defined as a time period of more than 24 hours prior to the originally scheduled appointment. This allows for the appointment to be offered to other patients. 9. Rescheduling:  When a visit is rescheduled, it will require the  cancellation of the original appointment. For this reason they both fall within the category of "Cancellations".  10. Cancellations:  They require advanced notification. Any cancellation less than 24 hours before the  appointment will be recorded as a "No Show". 11. No Show:  Defined as an unkept appointment where the patient failed to notify or declare to the practice their intention or inability to keep the appointment.  Corrective process for repeat offenders:  1. Tardiness: Three (3) episodes of rescheduling due to late arrivals will be recorded as one (1) "No Show". 2. Cancellation or reschedule:  Three (3) cancellations or rescheduling will be recorded as one (1) "No Show". 3. "No Shows": Three (3) "No Shows" within a 12 month period will result in discharge from the practice.  ____________________________________________________________________________________________   ____________________________________________________________________________________________  Pain Scale  Introduction: The pain score used by this practice is the Verbal Numerical Rating Scale (VNRS-11). This is an 11-point scale. It is for adults and children 10 years or older. There are significant differences in how the pain score is reported, used, and applied. Forget everything you learned in the past and learn this scoring system.  General Information: The scale should reflect your current level of pain. Unless you are specifically asked for the level of your worst pain, or your average pain. If you are asked for one of these two, then it should be understood that it is over the past 24 hours.  Basic Activities of Daily Living (ADL): Personal hygiene, dressing, eating, transferring, and using restroom.  Instructions: Most patients tend to report their level of pain as a combination of two factors, their physical pain and their psychosocial pain. This last one is also known as "suffering" and it is reflection of  how physical pain affects you socially and psychologically. From now on, report them separately. From this point on, when asked to report your pain level, report only your physical pain. Use the following table for reference.  Pain Clinic Pain Levels (0-5/10)  Pain Level Score  Description  No Pain 0   Mild pain 1 Nagging, annoying, but does not interfere with basic activities of daily living (ADL). Patients are able to eat, bathe, get dressed, toileting (being able to get on and off the toilet and perform personal hygiene functions), transfer (move in and out of bed or a chair without assistance), and maintain continence (able to control bladder and bowel functions). Blood pressure and heart rate are unaffected. A normal heart rate for a healthy adult ranges from 60 to 100 bpm (beats per minute).   Mild to moderate pain 2 Noticeable and distracting. Impossible to hide from other people. More frequent flare-ups. Still possible to adapt and function close to normal. It can be very annoying and may have occasional stronger flare-ups. With discipline, patients may get used to it and adapt.   Moderate pain 3 Interferes significantly with activities of daily living (ADL). It becomes difficult to feed, bathe, get dressed, get on and off the toilet or to perform personal hygiene functions. Difficult to get in and out of bed or a chair without assistance. Very distracting. With effort, it can be ignored when deeply involved in activities.   Moderately severe pain 4 Impossible to ignore for more than a few minutes. With effort, patients may still be able to manage work or participate in some social activities. Very difficult to concentrate. Signs of autonomic nervous system discharge are evident: dilated pupils (mydriasis); mild sweating (diaphoresis); sleep interference. Heart rate becomes elevated (>115 bpm). Diastolic blood pressure (lower number) rises above 100 mmHg. Patients find relief in laying down and  not moving.   Severe pain 5 Intense and extremely unpleasant. Associated with frowning face and frequent crying. Pain overwhelms the senses.  Ability to do any activity or maintain social relationships becomes significantly limited. Conversation becomes difficult. Pacing back and forth is common, as getting into a comfortable position is nearly impossible. Pain wakes you up from deep sleep. Physical signs will be obvious: pupillary dilation; increased sweating; goosebumps; brisk reflexes; cold, clammy hands and feet; nausea, vomiting  or dry heaves; loss of appetite; significant sleep disturbance with inability to fall asleep or to remain asleep. When persistent, significant weight loss is observed due to the complete loss of appetite and sleep deprivation.  Blood pressure and heart rate becomes significantly elevated. Caution: If elevated blood pressure triggers a pounding headache associated with blurred vision, then the patient should immediately seek attention at an urgent or emergency care unit, as these may be signs of an impending stroke.    Emergency Department Pain Levels (6-10/10)  Emergency Room Pain 6 Severely limiting. Requires emergency care and should not be seen or managed at an outpatient pain management facility. Communication becomes difficult and requires great effort. Assistance to reach the emergency department may be required. Facial flushing and profuse sweating along with potentially dangerous increases in heart rate and blood pressure will be evident.   Distressing pain 7 Self-care is very difficult. Assistance is required to transport, or use restroom. Assistance to reach the emergency department will be required. Tasks requiring coordination, such as bathing and getting dressed become very difficult.   Disabling pain 8 Self-care is no longer possible. At this level, pain is disabling. The individual is unable to do even the most "basic" activities such as walking, eating,  bathing, dressing, transferring to a bed, or toileting. Fine motor skills are lost. It is difficult to think clearly.   Incapacitating pain 9 Pain becomes incapacitating. Thought processing is no longer possible. Difficult to remember your own name. Control of movement and coordination are lost.   The worst pain imaginable 10 At this level, most patients pass out from pain. When this level is reached, collapse of the autonomic nervous system occurs, leading to a sudden drop in blood pressure and heart rate. This in turn results in a temporary and dramatic drop in blood flow to the brain, leading to a loss of consciousness. Fainting is one of the body's self defense mechanisms. Passing out puts the brain in a calmed state and causes it to shut down for a while, in order to begin the healing process.    Summary: 1. Refer to this scale when providing [place] with your pain level. 2. Be accurate and careful when reporting your pain level. This will help with your care. 3. Over-reporting your pain level will lead to loss of credibility. 4. Even a level of 1/10 means that there is pain and will be treated at our facility. 5. High, inaccurate reporting will be documented as "Symptom Exaggeration", leading to loss of credibility and suspicions of possible secondary gains such as obtaining more narcotics, or wanting to appear disabled, for fraudulent reasons. 6. Only pain levels of 5 or below will be seen at our facility. 7. Pain levels of 6 and above will be sent to the Emergency Department and the appointment cancelled. ____________________________________________________________________________________________

## 2017-10-06 LAB — COMPLIANCE DRUG ANALYSIS, UR

## 2017-10-08 LAB — COMP. METABOLIC PANEL (12)
A/G RATIO: 2.4 — AB (ref 1.2–2.2)
ALBUMIN: 5 g/dL — AB (ref 3.6–4.8)
AST: 11 IU/L (ref 0–40)
Alkaline Phosphatase: 77 IU/L (ref 39–117)
BILIRUBIN TOTAL: 0.5 mg/dL (ref 0.0–1.2)
BUN / CREAT RATIO: 14 (ref 10–24)
BUN: 13 mg/dL (ref 8–27)
CALCIUM: 10.1 mg/dL (ref 8.6–10.2)
CHLORIDE: 99 mmol/L (ref 96–106)
Creatinine, Ser: 0.93 mg/dL (ref 0.76–1.27)
GFR calc non Af Amer: 86 mL/min/{1.73_m2} (ref >59)
GFR, EST AFRICAN AMERICAN: 100 mL/min/{1.73_m2} (ref >59)
Globulin, Total: 2.1 g/dL (ref 1.5–4.5)
Glucose: 118 mg/dL — ABNORMAL HIGH (ref 65–99)
Potassium: 4.1 mmol/L (ref 3.5–5.2)
Sodium: 138 mmol/L (ref 134–144)
TOTAL PROTEIN: 7.1 g/dL (ref 6.0–8.5)

## 2017-10-08 LAB — VITAMIN B12: Vitamin B-12: >2000 pg/mL — ABNORMAL HIGH (ref 232–1245)

## 2017-10-08 LAB — 25-HYDROXY VITAMIN D LCMS D2+D3
25-Hydroxy, Vitamin D-2: <1 ng/mL
25-Hydroxy, Vitamin D-3: 32 ng/mL
25-Hydroxy, Vitamin D: 32 ng/mL

## 2017-10-08 LAB — SEDIMENTATION RATE: Sed Rate: 16 mm/hr (ref 0–30)

## 2017-10-08 LAB — MAGNESIUM: MAGNESIUM: 1.6 mg/dL (ref 1.6–2.3)

## 2017-10-08 LAB — C-REACTIVE PROTEIN: CRP: 1.3 mg/L (ref 0.0–4.9)

## 2017-10-17 NOTE — Progress Notes (Signed)
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 strength & Tone: Complains of area being tender to palpation over the rhomboid muscles, bilaterally.  A palpable trigger point was seen on the right side.  Lumbar Spine Area Exam  Skin & Axial Inspection: No masses, redness, or swelling Alignment: Symmetrical Functional ROM: Decreased ROM      Stability: No instability detected Muscle Tone/Strength: Functionally intact. No obvious neuro-muscular anomalies detected. Sensory (Neurological): Movement-associated pain Palpation: Complains of area being tender to palpation       Provocative Tests: Lumbar Hyperextension and rotation test: Negative       Lumbar Lateral bending test: Negative       Patrick's Maneuver: Positive             for bilateral hip arthralgia  Gait & Posture Assessment   Ambulation: Unassisted Gait: Antalgic Posture: Difficulty standing up straight, due to pain   Lower Extremity Exam    Side: Right lower extremity  Side: Left lower extremity  Skin & Extremity Inspection: Skin color, temperature, and hair growth are WNL. No peripheral edema or cyanosis. No masses, redness, swelling, asymmetry, or associated skin lesions. No contractures.  Skin & Extremity Inspection: Skin color, temperature, and hair growth are WNL. No peripheral edema or cyanosis. No masses, redness, swelling, asymmetry, or associated skin lesions. No contractures.  Functional ROM: Decreased ROM for hip joint  Functional ROM: Decreased ROM for hip joint  Muscle Tone/Strength: Able to Toe-walk & Heel-walk without problems  Muscle Tone/Strength: Able to Toe-walk & Heel-walk without problems  Sensory (Neurological): Unimpaired  Sensory (Neurological): Articular pain pattern  Palpation: No palpable anomalies  Palpation: No palpable anomalies   Assessment & Plan  Primary Diagnosis & Pertinent Problem List: The primary encounter diagnosis was Chronic pain syndrome. Diagnoses of Chronic thoracic back pain (Primary Area of Pain) (Midline), DDD (degenerative disc disease), thoracic, Chronic upper back pain (midline), Closed fracture of ribs (1-8), sequela (Right), Chronic hip pain (Secondary Area of Pain) (Left), Closed intertrochanteric fracture of femur, sequela, Trochanteric bursitis, left hip, Cervical Anterolisthesis (2.5 cm) (C4 on C5), DDD (degenerative disc disease), cervical, Lumbar spondylosis, Osteoarthritis of shoulder (Right), Osteoarthritis of knee (Right), Osteoarthritis of ankle (Right), Chronic lower extremity pain (Left), Disorder of skeletal system, Osteoarthritis, Long term prescription opiate use, Pharmacologic therapy, Problems influencing health status, NSAID long-term use, Chronic myofascial pain (Between shoulder blades/rhomboids) (Bilateral), Cervical radiculitis (Bilateral),  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  Visit Diagnosis: 1. Chronic pain syndrome   2. Chronic thoracic back pain (Primary Area of Pain) (Midline)   3. DDD (degenerative disc disease), thoracic   4. Chronic upper back pain (midline)   5. Closed fracture of ribs (1-8), sequela (Right)   6. Chronic hip pain (Secondary Area of Pain) (Left)   7. Closed intertrochanteric fracture of femur, sequela   8. Trochanteric bursitis, left hip   9. Cervical Anterolisthesis (2.5 cm) (C4 on C5)   10. DDD (degenerative disc disease),  cervical   11. Lumbar spondylosis   12. Osteoarthritis of shoulder (Right)   13. Osteoarthritis of knee (Right)   14. Osteoarthritis of ankle (Right)   15. Chronic lower extremity pain (Left)   16. Disorder of skeletal system   17. Osteoarthritis   18. Long term prescription opiate use   19. Pharmacologic therapy   20. Problems influencing health status   21. NSAID long-term use   22. Chronic myofascial pain (Between shoulder blades/rhomboids) (Bilateral)   23. Cervical radiculitis (Bilateral)   24. 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 Spisak, was spent in counseling/coordination of care regarding: Mr. Loria primary cause of pain, the results of his recent test(s), the significance of each one oth the test(s) anomalies and it's corresponding characteristic pain pattern(s), the treatment plan, treatment alternatives, realistic  expectations, the goals of pain management (increased in functionality) and the need to collect and read the AVS material.  Plan of Care  Pharmacotherapy (Medications Ordered): No orders of the defined types were placed in this encounter.   Procedure Orders     TRIGGER POINT INJECTION Lab Orders  No laboratory test(s) ordered today    Imaging Orders     DG HIP UNILAT W OR W/O PELVIS 2-3 VIEWS RIGHT     DG HIP UNILAT W OR W/O PELVIS 2-3 VIEWS LEFT     MR CERVICAL SPINE WO CONTRAST     MR THORACIC SPINE WO CONTRAST Referral Orders  No referral(s) requested today    Pharmacological management options:  Opioid Analgesics: We'll take over management today. See above orders Membrane stabilizer: We have discussed the possibility of optimizing this mode of therapy, if tolerated Muscle relaxant: We have discussed the possibility of a trial NSAID: We have discussed the possibility of a trial Other analgesic(s): To be determined at a later time   Interventional management options: Planned, scheduled, and/or pending:    Diagnostic trigger point injections of the rhomboid muscles, bilaterally. Today I have ordered x-rays of the hip joints, bilaterally. In addition, today I have also ordered an MRI of the cervical spine and thoracic spine in order to help figure out where this pain may be coming from.  Both areas show changes on regular x-rays that could justify the patient's symptoms.   Considering:   Diagnostic (Midline) thoracic epidural steroid injection  Diagnostic bilateral thoracic facet block  Possible bilateral thoracic facet RFA    PRN Procedures:   None at this time   Provider-requested follow-up: Return for Procedure (no sedation): (B) Thoracic/Rhomboid MNB.  No future appointments.  Primary Care Physician: Tracie Harrier, MD Location: [facility] Note by: Gaspar Cola, MD Date: 10/19/2017; Time: 11:18 AM

## 2017-10-18 DIAGNOSIS — M25551 Pain in right hip: Secondary | ICD-10-CM

## 2017-10-18 DIAGNOSIS — E119 Type 2 diabetes mellitus without complications: Secondary | ICD-10-CM | POA: Insufficient documentation

## 2017-10-18 DIAGNOSIS — M4310 Spondylolisthesis, site unspecified: Secondary | ICD-10-CM | POA: Insufficient documentation

## 2017-10-18 DIAGNOSIS — R011 Cardiac murmur, unspecified: Secondary | ICD-10-CM | POA: Insufficient documentation

## 2017-10-18 DIAGNOSIS — F32A Depression, unspecified: Secondary | ICD-10-CM | POA: Insufficient documentation

## 2017-10-18 DIAGNOSIS — Z791 Long term (current) use of non-steroidal anti-inflammatories (NSAID): Secondary | ICD-10-CM | POA: Insufficient documentation

## 2017-10-18 DIAGNOSIS — M549 Dorsalgia, unspecified: Secondary | ICD-10-CM

## 2017-10-18 DIAGNOSIS — Z79899 Other long term (current) drug therapy: Secondary | ICD-10-CM | POA: Insufficient documentation

## 2017-10-18 DIAGNOSIS — F1190 Opioid use, unspecified, uncomplicated: Secondary | ICD-10-CM | POA: Insufficient documentation

## 2017-10-18 DIAGNOSIS — F329 Major depressive disorder, single episode, unspecified: Secondary | ICD-10-CM | POA: Insufficient documentation

## 2017-10-18 DIAGNOSIS — Z79891 Long term (current) use of opiate analgesic: Secondary | ICD-10-CM | POA: Insufficient documentation

## 2017-10-18 DIAGNOSIS — G8929 Other chronic pain: Secondary | ICD-10-CM | POA: Insufficient documentation

## 2017-10-18 DIAGNOSIS — I739 Peripheral vascular disease, unspecified: Secondary | ICD-10-CM | POA: Insufficient documentation

## 2017-10-18 DIAGNOSIS — M19071 Primary osteoarthritis, right ankle and foot: Secondary | ICD-10-CM | POA: Insufficient documentation

## 2017-10-18 DIAGNOSIS — S2241XS Multiple fractures of ribs, right side, sequela: Secondary | ICD-10-CM | POA: Insufficient documentation

## 2017-10-18 DIAGNOSIS — Z789 Other specified health status: Secondary | ICD-10-CM | POA: Insufficient documentation

## 2017-10-18 DIAGNOSIS — M19011 Primary osteoarthritis, right shoulder: Secondary | ICD-10-CM | POA: Insufficient documentation

## 2017-10-18 DIAGNOSIS — M546 Pain in thoracic spine: Secondary | ICD-10-CM

## 2017-10-18 DIAGNOSIS — M25552 Pain in left hip: Secondary | ICD-10-CM

## 2017-10-19 ENCOUNTER — Ambulatory Visit: Payer: BLUE CROSS/BLUE SHIELD | Attending: Pain Medicine | Admitting: Pain Medicine

## 2017-10-19 ENCOUNTER — Encounter: Payer: Self-pay | Admitting: Pain Medicine

## 2017-10-19 ENCOUNTER — Other Ambulatory Visit: Payer: Self-pay

## 2017-10-19 VITALS — BP 107/51 | HR 84 | Temp 98.0°F | Resp 16 | Ht 69.0 in | Wt 198.0 lb

## 2017-10-19 DIAGNOSIS — F329 Major depressive disorder, single episode, unspecified: Secondary | ICD-10-CM | POA: Insufficient documentation

## 2017-10-19 DIAGNOSIS — M47816 Spondylosis without myelopathy or radiculopathy, lumbar region: Secondary | ICD-10-CM | POA: Diagnosis not present

## 2017-10-19 DIAGNOSIS — Z79891 Long term (current) use of opiate analgesic: Secondary | ICD-10-CM | POA: Diagnosis not present

## 2017-10-19 DIAGNOSIS — Z79899 Other long term (current) drug therapy: Secondary | ICD-10-CM

## 2017-10-19 DIAGNOSIS — M5030 Other cervical disc degeneration, unspecified cervical region: Secondary | ICD-10-CM

## 2017-10-19 DIAGNOSIS — M899 Disorder of bone, unspecified: Secondary | ICD-10-CM

## 2017-10-19 DIAGNOSIS — E669 Obesity, unspecified: Secondary | ICD-10-CM | POA: Diagnosis not present

## 2017-10-19 DIAGNOSIS — E871 Hypo-osmolality and hyponatremia: Secondary | ICD-10-CM | POA: Diagnosis not present

## 2017-10-19 DIAGNOSIS — Z6829 Body mass index (BMI) 29.0-29.9, adult: Secondary | ICD-10-CM | POA: Insufficient documentation

## 2017-10-19 DIAGNOSIS — E781 Pure hyperglyceridemia: Secondary | ICD-10-CM | POA: Insufficient documentation

## 2017-10-19 DIAGNOSIS — E1151 Type 2 diabetes mellitus with diabetic peripheral angiopathy without gangrene: Secondary | ICD-10-CM | POA: Diagnosis not present

## 2017-10-19 DIAGNOSIS — M25552 Pain in left hip: Secondary | ICD-10-CM | POA: Diagnosis not present

## 2017-10-19 DIAGNOSIS — S2241XS Multiple fractures of ribs, right side, sequela: Secondary | ICD-10-CM | POA: Diagnosis not present

## 2017-10-19 DIAGNOSIS — S2231XS Fracture of one rib, right side, sequela: Secondary | ICD-10-CM | POA: Diagnosis not present

## 2017-10-19 DIAGNOSIS — M5010 Cervical disc disorder with radiculopathy, unspecified cervical region: Secondary | ICD-10-CM | POA: Diagnosis not present

## 2017-10-19 DIAGNOSIS — M150 Primary generalized (osteo)arthritis: Secondary | ICD-10-CM

## 2017-10-19 DIAGNOSIS — M7918 Myalgia, other site: Secondary | ICD-10-CM | POA: Insufficient documentation

## 2017-10-19 DIAGNOSIS — M549 Dorsalgia, unspecified: Secondary | ICD-10-CM

## 2017-10-19 DIAGNOSIS — M7062 Trochanteric bursitis, left hip: Secondary | ICD-10-CM | POA: Diagnosis not present

## 2017-10-19 DIAGNOSIS — M4310 Spondylolisthesis, site unspecified: Secondary | ICD-10-CM | POA: Diagnosis not present

## 2017-10-19 DIAGNOSIS — M160 Bilateral primary osteoarthritis of hip: Secondary | ICD-10-CM

## 2017-10-19 DIAGNOSIS — M159 Polyosteoarthritis, unspecified: Secondary | ICD-10-CM | POA: Insufficient documentation

## 2017-10-19 DIAGNOSIS — N529 Male erectile dysfunction, unspecified: Secondary | ICD-10-CM | POA: Insufficient documentation

## 2017-10-19 DIAGNOSIS — I10 Essential (primary) hypertension: Secondary | ICD-10-CM | POA: Insufficient documentation

## 2017-10-19 DIAGNOSIS — M19011 Primary osteoarthritis, right shoulder: Secondary | ICD-10-CM | POA: Diagnosis not present

## 2017-10-19 DIAGNOSIS — F419 Anxiety disorder, unspecified: Secondary | ICD-10-CM | POA: Insufficient documentation

## 2017-10-19 DIAGNOSIS — F429 Obsessive-compulsive disorder, unspecified: Secondary | ICD-10-CM | POA: Diagnosis not present

## 2017-10-19 DIAGNOSIS — Z791 Long term (current) use of non-steroidal anti-inflammatories (NSAID): Secondary | ICD-10-CM | POA: Diagnosis not present

## 2017-10-19 DIAGNOSIS — M170 Bilateral primary osteoarthritis of knee: Secondary | ICD-10-CM | POA: Insufficient documentation

## 2017-10-19 DIAGNOSIS — M79605 Pain in left leg: Secondary | ICD-10-CM

## 2017-10-19 DIAGNOSIS — Z7984 Long term (current) use of oral hypoglycemic drugs: Secondary | ICD-10-CM | POA: Diagnosis not present

## 2017-10-19 DIAGNOSIS — M5134 Other intervertebral disc degeneration, thoracic region: Secondary | ICD-10-CM | POA: Diagnosis not present

## 2017-10-19 DIAGNOSIS — M1711 Unilateral primary osteoarthritis, right knee: Secondary | ICD-10-CM

## 2017-10-19 DIAGNOSIS — M542 Cervicalgia: Secondary | ICD-10-CM

## 2017-10-19 DIAGNOSIS — K210 Gastro-esophageal reflux disease with esophagitis: Secondary | ICD-10-CM | POA: Diagnosis not present

## 2017-10-19 DIAGNOSIS — Z789 Other specified health status: Secondary | ICD-10-CM

## 2017-10-19 DIAGNOSIS — Z7982 Long term (current) use of aspirin: Secondary | ICD-10-CM | POA: Insufficient documentation

## 2017-10-19 DIAGNOSIS — Z87891 Personal history of nicotine dependence: Secondary | ICD-10-CM | POA: Insufficient documentation

## 2017-10-19 DIAGNOSIS — M546 Pain in thoracic spine: Secondary | ICD-10-CM | POA: Diagnosis not present

## 2017-10-19 DIAGNOSIS — G4700 Insomnia, unspecified: Secondary | ICD-10-CM | POA: Insufficient documentation

## 2017-10-19 DIAGNOSIS — M5412 Radiculopathy, cervical region: Secondary | ICD-10-CM | POA: Insufficient documentation

## 2017-10-19 DIAGNOSIS — S72143S Displaced intertrochanteric fracture of unspecified femur, sequela: Secondary | ICD-10-CM

## 2017-10-19 DIAGNOSIS — G8929 Other chronic pain: Secondary | ICD-10-CM

## 2017-10-19 DIAGNOSIS — M19071 Primary osteoarthritis, right ankle and foot: Secondary | ICD-10-CM

## 2017-10-19 DIAGNOSIS — G894 Chronic pain syndrome: Secondary | ICD-10-CM

## 2017-10-19 NOTE — Patient Instructions (Addendum)
____________________________________________________________________________________________  Pain Scale  Introduction: The pain score used by this practice is the Verbal Numerical Rating Scale (VNRS-11). This is an 11-point scale. It is for adults and children 10 years or older. There are significant differences in how the pain score is reported, used, and applied. Forget everything you learned in the past and learn this scoring system.  General Information: The scale should reflect your current level of pain. Unless you are specifically asked for the level of your worst pain, or your average pain. If you are asked for one of these two, then it should be understood that it is over the past 24 hours.  Basic Activities of Daily Living (ADL): Personal hygiene, dressing, eating, transferring, and using restroom.  Instructions: Most patients tend to report their level of pain as a combination of two factors, their physical pain and their psychosocial pain. This last one is also known as "suffering" and it is reflection of how physical pain affects you socially and psychologically. From now on, report them separately. From this point on, when asked to report your pain level, report only your physical pain. Use the following table for reference.  Pain Clinic Pain Levels (0-5/10)  Pain Level Score  Description  No Pain 0   Mild pain 1 Nagging, annoying, but does not interfere with basic activities of daily living (ADL). Patients are able to eat, bathe, get dressed, toileting (being able to get on and off the toilet and perform personal hygiene functions), transfer (move in and out of bed or a chair without assistance), and maintain continence (able to control bladder and bowel functions). Blood pressure and heart rate are unaffected. A normal heart rate for a healthy adult ranges from 60 to 100 bpm (beats per minute).   Mild to moderate pain 2 Noticeable and distracting. Impossible to hide from other  people. More frequent flare-ups. Still possible to adapt and function close to normal. It can be very annoying and may have occasional stronger flare-ups. With discipline, patients may get used to it and adapt.   Moderate pain 3 Interferes significantly with activities of daily living (ADL). It becomes difficult to feed, bathe, get dressed, get on and off the toilet or to perform personal hygiene functions. Difficult to get in and out of bed or a chair without assistance. Very distracting. With effort, it can be ignored when deeply involved in activities.   Moderately severe pain 4 Impossible to ignore for more than a few minutes. With effort, patients may still be able to manage work or participate in some social activities. Very difficult to concentrate. Signs of autonomic nervous system discharge are evident: dilated pupils (mydriasis); mild sweating (diaphoresis); sleep interference. Heart rate becomes elevated (>115 bpm). Diastolic blood pressure (lower number) rises above 100 mmHg. Patients find relief in laying down and not moving.   Severe pain 5 Intense and extremely unpleasant. Associated with frowning face and frequent crying. Pain overwhelms the senses.  Ability to do any activity or maintain social relationships becomes significantly limited. Conversation becomes difficult. Pacing back and forth is common, as getting into a comfortable position is nearly impossible. Pain wakes you up from deep sleep. Physical signs will be obvious: pupillary dilation; increased sweating; goosebumps; brisk reflexes; cold, clammy hands and feet; nausea, vomiting or dry heaves; loss of appetite; significant sleep disturbance with inability to fall asleep or to remain asleep. When persistent, significant weight loss is observed due to the complete loss of appetite and sleep deprivation.  Blood   pressure and heart rate becomes significantly elevated. Caution: If elevated blood pressure triggers a pounding headache  associated with blurred vision, then the patient should immediately seek attention at an urgent or emergency care unit, as these may be signs of an impending stroke.    Emergency Department Pain Levels (6-10/10)  Emergency Room Pain 6 Severely limiting. Requires emergency care and should not be seen or managed at an outpatient pain management facility. Communication becomes difficult and requires great effort. Assistance to reach the emergency department may be required. Facial flushing and profuse sweating along with potentially dangerous increases in heart rate and blood pressure will be evident.   Distressing pain 7 Self-care is very difficult. Assistance is required to transport, or use restroom. Assistance to reach the emergency department will be required. Tasks requiring coordination, such as bathing and getting dressed become very difficult.   Disabling pain 8 Self-care is no longer possible. At this level, pain is disabling. The individual is unable to do even the most "basic" activities such as walking, eating, bathing, dressing, transferring to a bed, or toileting. Fine motor skills are lost. It is difficult to think clearly.   Incapacitating pain 9 Pain becomes incapacitating. Thought processing is no longer possible. Difficult to remember your own name. Control of movement and coordination are lost.   The worst pain imaginable 10 At this level, most patients pass out from pain. When this level is reached, collapse of the autonomic nervous system occurs, leading to a sudden drop in blood pressure and heart rate. This in turn results in a temporary and dramatic drop in blood flow to the brain, leading to a loss of consciousness. Fainting is one of the body's self defense mechanisms. Passing out puts the brain in a calmed state and causes it to shut down for a while, in order to begin the healing process.    Summary: 1. Refer to this scale when providing [place] with your pain level. 2. Be  accurate and careful when reporting your pain level. This will help with your care. 3. Over-reporting your pain level will lead to loss of credibility. 4. Even a level of 1/10 means that there is pain and will be treated at our facility. 5. High, inaccurate reporting will be documented as "Symptom Exaggeration", leading to loss of credibility and suspicions of possible secondary gains such as obtaining more narcotics, or wanting to appear disabled, for fraudulent reasons. 6. Only pain levels of 5 or below will be seen at our facility. 7. Pain levels of 6 and above will be sent to the Emergency Department and the appointment cancelled. ____________________________________________________________________________________________   ____________________________________________________________________________________________  Preparing for your procedure (without sedation) Instructions: . Oral Intake: Do not eat or drink anything for at least 3 hours prior to your procedure. . Transportation: Unless otherwise stated by your physician, you may drive yourself after the procedure. . Blood Pressure Medicine: Take your blood pressure medicine with a sip of water the morning of the procedure. . Blood thinners:  . Diabetics on insulin: Notify the staff so that you can be scheduled 1st case in the morning. If your diabetes requires high dose insulin, take only  of your normal insulin dose the morning of the procedure and notify the staff that you have done so. . Preventing infections: Shower with an antibacterial soap the morning of your procedure.  . Build-up your immune system: Take 1000 mg of Vitamin C with every meal (3 times a day) the day prior to your  procedure. Marland Kitchen Antibiotics: Inform the staff if you have a condition or reason that requires you to take antibiotics before dental procedures. . Pregnancy: If you are pregnant, call and cancel the procedure. . Sickness: If you have a cold, fever, or any  active infections, call and cancel the procedure. . Arrival: You must be in the facility at least 30 minutes prior to your scheduled procedure. . Children: Do not bring any children with you. . Dress appropriately: Bring dark clothing that you would not mind if they get stained. . Valuables: Do not bring any jewelry or valuables. Procedure appointments are reserved for interventional treatments only. Marland Kitchen No Prescription Refills. . No medication changes will be discussed during procedure appointments. . No disability issues will be discussed. ____________________________________________________________________________________________    Trigger Point Injection Trigger points are areas where you have pain. A trigger point injection is a shot given in the trigger point to help relieve pain for a few days to a few months. Common places for trigger points include:  The neck.  The shoulders.  The upper back.  The lower back.  A trigger point injection will not cure long-lasting (chronic) pain permanently. These injections do not always work for every person, but for some people they can help to relieve pain for a few days to a few months. Tell a health care provider about:  Any allergies you have.  All medicines you are taking, including vitamins, herbs, eye drops, creams, and over-the-counter medicines.  Any problems you or family members have had with anesthetic medicines.  Any blood disorders you have.  Any surgeries you have had.  Any medical conditions you have. What are the risks? Generally, this is a safe procedure. However, problems may occur, including:  Infection.  Bleeding.  Allergic reaction to the injected medicine.  Irritation of the skin around the injection site.  What happens before the procedure?  Ask your health care provider about changing or stopping your regular medicines. This is especially important if you are taking diabetes medicines or blood  thinners. What happens during the procedure?  Your health care provider will feel for trigger points. A marker may be used to circle the area for the injection.  The skin over the trigger point will be washed with a germ-killing (antiseptic) solution.  A thin needle is used for the shot. You may feel pain or a twitching feeling when the needle enters the trigger point.  A numbing solution may be injected into the trigger point. Sometimes a medicine to keep down swelling, redness, and warmth (inflammation) is also injected.  Your health care provider may move the needle around the area where the trigger point is located until the tightness and twitching goes away.  After the injection, your health care provider may put gentle pressure over the injection site.  The injection site will be covered with a bandage (dressing). The procedure may vary among health care providers and hospitals. What happens after the procedure?  The dressing can be taken off in a few hours or as told by your health care provider.  You may feel sore and stiff for 1-2 days. This information is not intended to replace advice given to you by your health care provider. Make sure you discuss any questions you have with your health care provider. Document Released: 09/02/2011 Document Revised: 05/16/2016 Document Reviewed: 03/03/2015 Elsevier Interactive Patient Education  2018 [employer].

## 2017-11-08 ENCOUNTER — Telehealth (INDEPENDENT_AMBULATORY_CARE_PROVIDER_SITE_OTHER): Payer: Self-pay

## 2017-11-08 NOTE — Telephone Encounter (Signed)
Patient called and LM on xray VM asking for images of his hip/leg that he had done at our office to be put on a CD. I called patient back and verified, also advised patient of the $5 fee, he verbalized understanding.  He will pick up 11/09/17. CD placed up front for patient to pick up.

## 2018-01-10 DIAGNOSIS — G20 Parkinson's disease: Secondary | ICD-10-CM | POA: Insufficient documentation

## 2018-05-10 DIAGNOSIS — R262 Difficulty in walking, not elsewhere classified: Secondary | ICD-10-CM | POA: Insufficient documentation

## 2018-05-10 DIAGNOSIS — R011 Cardiac murmur, unspecified: Secondary | ICD-10-CM | POA: Insufficient documentation

## 2018-09-13 DIAGNOSIS — F331 Major depressive disorder, recurrent, moderate: Secondary | ICD-10-CM | POA: Insufficient documentation

## 2018-09-13 DIAGNOSIS — R911 Solitary pulmonary nodule: Secondary | ICD-10-CM | POA: Insufficient documentation

## 2018-09-14 ENCOUNTER — Other Ambulatory Visit: Payer: Self-pay | Admitting: Internal Medicine

## 2018-09-14 DIAGNOSIS — R911 Solitary pulmonary nodule: Secondary | ICD-10-CM

## 2018-09-15 ENCOUNTER — Other Ambulatory Visit: Payer: Self-pay | Admitting: Internal Medicine

## 2018-09-15 DIAGNOSIS — R911 Solitary pulmonary nodule: Secondary | ICD-10-CM

## 2018-09-22 ENCOUNTER — Ambulatory Visit
Admission: RE | Admit: 2018-09-22 | Discharge: 2018-09-22 | Disposition: A | Discharge status: Home / Self Care | Payer: Medicare Other | Source: Ambulatory Visit | Attending: Internal Medicine | Admitting: Internal Medicine

## 2018-09-22 DIAGNOSIS — R911 Solitary pulmonary nodule: Secondary | ICD-10-CM | POA: Insufficient documentation

## 2018-09-22 MED ORDER — IOHEXOL 300 MG/ML  SOLN
75.0000 mL | Freq: Once | INTRAMUSCULAR | Status: AC | PRN
  Administered 2018-09-22: 75 mL via INTRAVENOUS

## 2018-11-21 ENCOUNTER — Ambulatory Visit: Payer: Medicare Other | Attending: Neurology | Admitting: Speech Pathology

## 2018-11-21 DIAGNOSIS — R490 Dysphonia: Secondary | ICD-10-CM

## 2018-11-22 ENCOUNTER — Other Ambulatory Visit: Payer: Self-pay

## 2018-11-22 ENCOUNTER — Encounter: Payer: Self-pay | Admitting: Speech Pathology
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
## 2018-11-27 ENCOUNTER — Ambulatory Visit: Payer: Medicare Other | Attending: Neurology | Admitting: Speech Pathology

## 2018-11-27 DIAGNOSIS — R490 Dysphonia: Secondary | ICD-10-CM | POA: Diagnosis present

## 2018-11-28 ENCOUNTER — Ambulatory Visit: Payer: Medicare Other | Admitting: Speech Pathology

## 2018-11-28 ENCOUNTER — Encounter: Payer: Self-pay | Admitting: Speech Pathology
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
## 2018-11-29 ENCOUNTER — Ambulatory Visit: Payer: Medicare Other | Admitting: Speech Pathology

## 2018-11-29 DIAGNOSIS — R490 Dysphonia: Secondary | ICD-10-CM

## 2018-11-30 ENCOUNTER — Ambulatory Visit: Payer: Medicare Other | Admitting: Speech Pathology

## 2018-11-30 ENCOUNTER — Encounter: Payer: Self-pay | Admitting: Speech Pathology

## 2018-11-30 DIAGNOSIS — R490 Dysphonia: Secondary | ICD-10-CM | POA: Diagnosis not present
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
## 2018-12-04 ENCOUNTER — Ambulatory Visit: Payer: Medicare Other | Admitting: Speech Pathology

## 2018-12-05 ENCOUNTER — Ambulatory Visit: Payer: Medicare Other | Admitting: Speech Pathology

## 2018-12-06 ENCOUNTER — Ambulatory Visit: Payer: Medicare Other | Admitting: Speech Pathology

## 2018-12-07 ENCOUNTER — Ambulatory Visit: Payer: Medicare Other | Admitting: Speech Pathology

## 2018-12-11 ENCOUNTER — Ambulatory Visit: Payer: Medicare Other | Admitting: Speech Pathology

## 2018-12-12 ENCOUNTER — Ambulatory Visit: Payer: Medicare Other | Admitting: Speech Pathology

## 2018-12-13 ENCOUNTER — Ambulatory Visit: Payer: Medicare Other | Admitting: Speech Pathology

## 2018-12-14 ENCOUNTER — Ambulatory Visit: Payer: Medicare Other | Admitting: Speech Pathology

## 2018-12-18 ENCOUNTER — Ambulatory Visit: Payer: Medicare Other | Admitting: Speech Pathology

## 2018-12-19 ENCOUNTER — Ambulatory Visit: Payer: Medicare Other | Admitting: Speech Pathology

## 2018-12-20 ENCOUNTER — Ambulatory Visit: Payer: Medicare Other | Admitting: Speech Pathology

## 2018-12-21 ENCOUNTER — Ambulatory Visit: Payer: Medicare Other | Admitting: Speech Pathology

## 2019-01-15 DIAGNOSIS — R296 Repeated falls: Secondary | ICD-10-CM | POA: Insufficient documentation

## 2019-01-15 DIAGNOSIS — F3341 Major depressive disorder, recurrent, in partial remission: Secondary | ICD-10-CM | POA: Insufficient documentation

## 2019-02-17 ENCOUNTER — Encounter: Payer: Self-pay | Admitting: Emergency Medicine

## 2019-02-17 ENCOUNTER — Emergency Department
Admission: EM | Admit: 2019-02-17 | Discharge: 2019-02-17 | Disposition: A | Discharge status: Home / Self Care | Payer: Medicare Other | Attending: Emergency Medicine | Admitting: Emergency Medicine

## 2019-02-17 ENCOUNTER — Other Ambulatory Visit: Payer: Self-pay

## 2019-02-17 DIAGNOSIS — J029 Acute pharyngitis, unspecified: Secondary | ICD-10-CM | POA: Insufficient documentation

## 2019-02-17 DIAGNOSIS — Z5321 Procedure and treatment not carried out due to patient leaving prior to being seen by health care provider: Secondary | ICD-10-CM | POA: Insufficient documentation

## 2019-02-17 LAB — COMPREHENSIVE METABOLIC PANEL
ALT: <5 U/L (ref 0–44)
AST: 10 U/L — ABNORMAL LOW (ref 15–41)
Albumin: 3.9 g/dL (ref 3.5–5.0)
Alkaline Phosphatase: 64 U/L (ref 38–126)
Anion gap: 9 (ref 5–15)
BUN: 21 mg/dL (ref 8–23)
CO2: 23 mmol/L (ref 22–32)
Calcium: 8.8 mg/dL — ABNORMAL LOW (ref 8.9–10.3)
Chloride: 107 mmol/L (ref 98–111)
Creatinine, Ser: 1.11 mg/dL (ref 0.61–1.24)
GFR calc Af Amer: >60 mL/min (ref >60)
GFR calc non Af Amer: >60 mL/min (ref >60)
Glucose, Bld: 108 mg/dL — ABNORMAL HIGH (ref 70–99)
Potassium: 4.4 mmol/L (ref 3.5–5.1)
Sodium: 139 mmol/L (ref 135–145)
Total Bilirubin: 0.5 mg/dL (ref 0.3–1.2)
Total Protein: 6.3 g/dL — ABNORMAL LOW (ref 6.5–8.1)

## 2019-02-17 LAB — CBC
HCT: 41.5 % (ref 39.0–52.0)
Hemoglobin: 13.3 g/dL (ref 13.0–17.0)
MCH: 27.2 pg (ref 26.0–34.0)
MCHC: 32 g/dL (ref 30.0–36.0)
MCV: 84.9 fL (ref 80.0–100.0)
Platelets: 243 10*3/uL (ref 150–400)
RBC: 4.89 MIL/uL (ref 4.22–5.81)
RDW: 14 % (ref 11.5–15.5)
WBC: 14.7 10*3/uL — ABNORMAL HIGH (ref 4.0–10.5)
nRBC: 0 % (ref 0.0–0.2)

## 2019-02-17 NOTE — ED Triage Notes (Signed)
Pt to ED with c/o of sore throat. Pt states he has been to PCP and given Rx and has used OTC meds with no relief.

## 2019-02-22 ENCOUNTER — Other Ambulatory Visit: Payer: Self-pay

## 2019-02-22 ENCOUNTER — Encounter: Payer: Self-pay | Admitting: Speech Pathology

## 2019-02-22 ENCOUNTER — Ambulatory Visit: Payer: Medicare Other | Attending: Unknown Physician Specialty | Admitting: Speech Pathology

## 2019-02-22 DIAGNOSIS — R490 Dysphonia: Secondary | ICD-10-CM | POA: Diagnosis present

## 2019-02-26 ENCOUNTER — Ambulatory Visit: Payer: Medicare Other | Admitting: Speech Pathology
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
## 2019-03-01 ENCOUNTER — Ambulatory Visit: Payer: Medicare Other | Attending: Neurology | Admitting: Speech Pathology

## 2019-03-01 ENCOUNTER — Encounter: Payer: Self-pay | Admitting: Speech Pathology

## 2019-03-01 ENCOUNTER — Other Ambulatory Visit: Payer: Self-pay

## 2019-03-01 DIAGNOSIS — R490 Dysphonia: Secondary | ICD-10-CM | POA: Diagnosis present

## 2019-03-01 DIAGNOSIS — R262 Difficulty in walking, not elsewhere classified: Secondary | ICD-10-CM | POA: Insufficient documentation

## 2019-03-01 DIAGNOSIS — M6281 Muscle weakness (generalized): Secondary | ICD-10-CM | POA: Diagnosis present

## 2019-03-01 DIAGNOSIS — Z9181 History of falling: Secondary | ICD-10-CM | POA: Diagnosis present
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
## 2019-03-05 ENCOUNTER — Encounter: Payer: Self-pay | Admitting: Physical Therapy

## 2019-03-05 ENCOUNTER — Other Ambulatory Visit: Payer: Self-pay

## 2019-03-05 ENCOUNTER — Encounter: Payer: Self-pay | Admitting: Speech Pathology

## 2019-03-05 ENCOUNTER — Ambulatory Visit: Payer: Medicare Other | Admitting: Physical Therapy

## 2019-03-05 ENCOUNTER — Ambulatory Visit: Payer: Medicare Other | Admitting: Speech Pathology

## 2019-03-05 DIAGNOSIS — R262 Difficulty in walking, not elsewhere classified: Secondary | ICD-10-CM

## 2019-03-05 DIAGNOSIS — R490 Dysphonia: Secondary | ICD-10-CM

## 2019-03-05 DIAGNOSIS — M6281 Muscle weakness (generalized): Secondary | ICD-10-CM

## 2019-03-05 DIAGNOSIS — Z9181 History of falling: Secondary | ICD-10-CM
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
## 2019-03-08 ENCOUNTER — Ambulatory Visit: Payer: Medicare Other | Admitting: Speech Pathology

## 2019-03-08 ENCOUNTER — Other Ambulatory Visit: Payer: Self-pay

## 2019-03-08 ENCOUNTER — Encounter: Payer: Self-pay | Admitting: Speech Pathology

## 2019-03-08 DIAGNOSIS — R490 Dysphonia: Secondary | ICD-10-CM | POA: Diagnosis not present
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
## 2019-03-12 ENCOUNTER — Ambulatory Visit: Payer: Medicare Other | Admitting: Speech Pathology

## 2019-03-12 ENCOUNTER — Ambulatory Visit: Payer: Medicare Other | Admitting: Physical Therapy

## 2019-03-12 ENCOUNTER — Other Ambulatory Visit: Payer: Self-pay

## 2019-03-12 ENCOUNTER — Encounter: Payer: Self-pay | Admitting: Speech Pathology

## 2019-03-12 ENCOUNTER — Encounter: Payer: Self-pay | Admitting: Physical Therapy

## 2019-03-12 DIAGNOSIS — R490 Dysphonia: Secondary | ICD-10-CM | POA: Diagnosis not present

## 2019-03-12 DIAGNOSIS — Z9181 History of falling: Secondary | ICD-10-CM

## 2019-03-12 DIAGNOSIS — M6281 Muscle weakness (generalized): Secondary | ICD-10-CM

## 2019-03-12 DIAGNOSIS — R262 Difficulty in walking, not elsewhere classified: Secondary | ICD-10-CM
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
## 2019-03-15 ENCOUNTER — Ambulatory Visit: Payer: Medicare Other | Admitting: Speech Pathology

## 2019-03-15 ENCOUNTER — Other Ambulatory Visit: Payer: Self-pay

## 2019-03-15 ENCOUNTER — Encounter: Payer: Self-pay | Admitting: Speech Pathology

## 2019-03-15 DIAGNOSIS — R490 Dysphonia: Secondary | ICD-10-CM
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
## 2019-03-19 ENCOUNTER — Encounter: Payer: Self-pay | Admitting: Speech Pathology

## 2019-03-19 ENCOUNTER — Other Ambulatory Visit: Payer: Self-pay

## 2019-03-19 ENCOUNTER — Ambulatory Visit: Payer: Medicare Other | Admitting: Speech Pathology

## 2019-03-19 ENCOUNTER — Ambulatory Visit: Payer: Medicare Other | Admitting: Physical Therapy

## 2019-03-19 ENCOUNTER — Encounter: Payer: Self-pay | Admitting: Physical Therapy

## 2019-03-19 DIAGNOSIS — Z9181 History of falling: Secondary | ICD-10-CM

## 2019-03-19 DIAGNOSIS — R490 Dysphonia: Secondary | ICD-10-CM | POA: Diagnosis not present

## 2019-03-19 DIAGNOSIS — R262 Difficulty in walking, not elsewhere classified: Secondary | ICD-10-CM

## 2019-03-19 DIAGNOSIS — M6281 Muscle weakness (generalized): Secondary | ICD-10-CM
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
## 2019-03-22 ENCOUNTER — Other Ambulatory Visit: Payer: Self-pay

## 2019-03-22 ENCOUNTER — Ambulatory Visit: Payer: Medicare Other | Admitting: Speech Pathology

## 2019-03-22 ENCOUNTER — Ambulatory Visit: Payer: Medicare Other | Admitting: Physical Therapy

## 2019-03-22 ENCOUNTER — Encounter: Payer: Self-pay | Admitting: Speech Pathology

## 2019-03-22 DIAGNOSIS — R490 Dysphonia: Secondary | ICD-10-CM | POA: Diagnosis not present
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
## 2019-03-26 ENCOUNTER — Other Ambulatory Visit: Payer: Self-pay

## 2019-03-26 ENCOUNTER — Encounter: Payer: Self-pay | Admitting: Speech Pathology

## 2019-03-26 ENCOUNTER — Ambulatory Visit: Payer: Medicare Other | Admitting: Speech Pathology

## 2019-03-26 DIAGNOSIS — R490 Dysphonia: Secondary | ICD-10-CM | POA: Diagnosis not present
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
## 2019-03-29 ENCOUNTER — Other Ambulatory Visit: Payer: Self-pay

## 2019-03-29 ENCOUNTER — Ambulatory Visit: Payer: Medicare Other | Attending: Neurology | Admitting: Speech Pathology

## 2019-03-29 ENCOUNTER — Encounter: Payer: Self-pay | Admitting: Speech Pathology

## 2019-03-29 DIAGNOSIS — R262 Difficulty in walking, not elsewhere classified: Secondary | ICD-10-CM | POA: Insufficient documentation

## 2019-03-29 DIAGNOSIS — Z9181 History of falling: Secondary | ICD-10-CM | POA: Insufficient documentation

## 2019-03-29 DIAGNOSIS — M6281 Muscle weakness (generalized): Secondary | ICD-10-CM | POA: Insufficient documentation

## 2019-03-29 DIAGNOSIS — R490 Dysphonia: Secondary | ICD-10-CM | POA: Diagnosis present
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
## 2019-04-02 ENCOUNTER — Ambulatory Visit: Payer: Medicare Other | Admitting: Speech Pathology

## 2019-04-02 ENCOUNTER — Ambulatory Visit: Payer: Medicare Other | Admitting: Physical Therapy

## 2019-04-05 ENCOUNTER — Ambulatory Visit: Payer: Medicare Other | Admitting: Speech Pathology

## 2019-04-05 ENCOUNTER — Encounter: Payer: Medicare Other | Admitting: Physical Therapy

## 2019-04-09 ENCOUNTER — Ambulatory Visit: Payer: Medicare Other | Admitting: Speech Pathology

## 2019-04-09 ENCOUNTER — Ambulatory Visit: Payer: Medicare Other | Admitting: Physical Therapy

## 2019-04-12 ENCOUNTER — Encounter: Payer: Medicare Other | Admitting: Physical Therapy

## 2019-04-12 ENCOUNTER — Ambulatory Visit: Payer: Medicare Other | Admitting: Speech Pathology

## 2019-04-16 ENCOUNTER — Ambulatory Visit: Payer: Medicare Other | Admitting: Speech Pathology

## 2019-04-16 ENCOUNTER — Ambulatory Visit: Payer: Medicare Other | Admitting: Physical Therapy

## 2019-04-19 ENCOUNTER — Encounter: Payer: Self-pay | Admitting: Physical Therapy

## 2019-04-19 ENCOUNTER — Ambulatory Visit: Payer: Medicare Other | Admitting: Physical Therapy

## 2019-04-19 ENCOUNTER — Ambulatory Visit: Payer: BLUE CROSS/BLUE SHIELD | Admitting: Urology

## 2019-04-19 ENCOUNTER — Other Ambulatory Visit: Payer: Self-pay

## 2019-04-19 ENCOUNTER — Ambulatory Visit: Payer: Medicare Other | Admitting: Speech Pathology

## 2019-04-19 DIAGNOSIS — Z9181 History of falling: Secondary | ICD-10-CM

## 2019-04-19 DIAGNOSIS — R262 Difficulty in walking, not elsewhere classified: Secondary | ICD-10-CM

## 2019-04-19 DIAGNOSIS — M6281 Muscle weakness (generalized): Secondary | ICD-10-CM

## 2019-04-19 DIAGNOSIS — R490 Dysphonia: Secondary | ICD-10-CM | POA: Diagnosis not present
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
## 2019-04-23 ENCOUNTER — Ambulatory Visit: Payer: Medicare Other | Admitting: Physical Therapy

## 2019-04-24 ENCOUNTER — Other Ambulatory Visit: Payer: Self-pay

## 2019-04-24 ENCOUNTER — Encounter: Payer: Self-pay | Admitting: Urology

## 2019-04-24 ENCOUNTER — Ambulatory Visit (INDEPENDENT_AMBULATORY_CARE_PROVIDER_SITE_OTHER): Payer: Medicare Other | Admitting: Urology

## 2019-04-24 VITALS — BP 172/80 | HR 88 | Ht 68.0 in | Wt 200.0 lb

## 2019-04-24 DIAGNOSIS — N5201 Erectile dysfunction due to arterial insufficiency: Secondary | ICD-10-CM | POA: Diagnosis not present

## 2019-04-24 MED ORDER — SILDENAFIL CITRATE 20 MG PO TABS: ORAL_TABLET | ORAL | 0 refills | Status: DC
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
## 2019-04-26 ENCOUNTER — Ambulatory Visit: Payer: Medicare Other

## 2019-04-30 ENCOUNTER — Other Ambulatory Visit: Payer: Self-pay

## 2019-04-30 ENCOUNTER — Ambulatory Visit: Payer: Medicare Other | Attending: Neurology

## 2019-04-30 ENCOUNTER — Ambulatory Visit: Payer: Medicare Other | Admitting: Speech Pathology

## 2019-04-30 ENCOUNTER — Encounter: Payer: Self-pay | Admitting: Speech Pathology

## 2019-04-30 DIAGNOSIS — M6281 Muscle weakness (generalized): Secondary | ICD-10-CM

## 2019-04-30 DIAGNOSIS — R490 Dysphonia: Secondary | ICD-10-CM

## 2019-04-30 DIAGNOSIS — R262 Difficulty in walking, not elsewhere classified: Secondary | ICD-10-CM

## 2019-04-30 DIAGNOSIS — Z9181 History of falling: Secondary | ICD-10-CM | POA: Diagnosis present
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
## 2019-05-01 ENCOUNTER — Other Ambulatory Visit: Payer: Self-pay | Admitting: Urology

## 2019-05-01 ENCOUNTER — Ambulatory Visit: Payer: Medicare Other | Admitting: Physical Therapy

## 2019-05-01 NOTE — Telephone Encounter (Signed)
Pt needs a refill of Sildenafil sent to [facility].

## 2019-05-02 MED ORDER — SILDENAFIL CITRATE 20 MG PO TABS: ORAL_TABLET | ORAL | 0 refills | Status: DC

## 2019-05-02 NOTE — Telephone Encounter (Signed)
rx sent

## 2019-05-03 ENCOUNTER — Ambulatory Visit: Payer: Medicare Other | Admitting: Speech Pathology

## 2019-05-03 ENCOUNTER — Ambulatory Visit: Payer: Medicare Other

## 2019-05-03 ENCOUNTER — Other Ambulatory Visit: Payer: Self-pay

## 2019-05-03 DIAGNOSIS — R262 Difficulty in walking, not elsewhere classified: Secondary | ICD-10-CM | POA: Diagnosis not present

## 2019-05-03 DIAGNOSIS — Z9181 History of falling: Secondary | ICD-10-CM

## 2019-05-03 DIAGNOSIS — M6281 Muscle weakness (generalized): Secondary | ICD-10-CM
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
## 2019-05-07 ENCOUNTER — Other Ambulatory Visit: Payer: Self-pay

## 2019-05-07 ENCOUNTER — Encounter: Payer: Self-pay | Admitting: Speech Pathology

## 2019-05-07 ENCOUNTER — Ambulatory Visit: Payer: Medicare Other | Admitting: Speech Pathology

## 2019-05-07 DIAGNOSIS — R262 Difficulty in walking, not elsewhere classified: Secondary | ICD-10-CM | POA: Diagnosis not present

## 2019-05-07 DIAGNOSIS — R490 Dysphonia: Secondary | ICD-10-CM
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
## 2019-05-10 ENCOUNTER — Ambulatory Visit: Payer: Medicare Other | Admitting: Speech Pathology

## 2019-05-10 ENCOUNTER — Other Ambulatory Visit: Payer: Self-pay

## 2019-05-10 ENCOUNTER — Encounter: Payer: Self-pay | Admitting: Speech Pathology

## 2019-05-10 DIAGNOSIS — R262 Difficulty in walking, not elsewhere classified: Secondary | ICD-10-CM | POA: Diagnosis not present

## 2019-05-10 DIAGNOSIS — R490 Dysphonia: Secondary | ICD-10-CM
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
## 2019-05-14 ENCOUNTER — Ambulatory Visit: Payer: Medicare Other | Admitting: Speech Pathology

## 2019-05-14 ENCOUNTER — Encounter: Payer: Self-pay | Admitting: Speech Pathology

## 2019-05-14 ENCOUNTER — Other Ambulatory Visit: Payer: Self-pay

## 2019-05-14 ENCOUNTER — Ambulatory Visit: Payer: Medicare Other

## 2019-05-14 DIAGNOSIS — R262 Difficulty in walking, not elsewhere classified: Secondary | ICD-10-CM

## 2019-05-14 DIAGNOSIS — R490 Dysphonia: Secondary | ICD-10-CM

## 2019-05-14 DIAGNOSIS — M6281 Muscle weakness (generalized): Secondary | ICD-10-CM

## 2019-05-14 DIAGNOSIS — Z9181 History of falling: Secondary | ICD-10-CM
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
## 2019-05-17 ENCOUNTER — Ambulatory Visit: Payer: Medicare Other | Admitting: Speech Pathology

## 2019-05-18 ENCOUNTER — Telehealth: Payer: Self-pay | Admitting: Urology

## 2019-05-18 MED ORDER — SILDENAFIL CITRATE 20 MG PO TABS: ORAL_TABLET | ORAL | 0 refills | Status: DC

## 2019-05-18 NOTE — Telephone Encounter (Signed)
Pt needs a refill for Sildenafil sent to [facility].

## 2019-05-18 NOTE — Telephone Encounter (Signed)
Refill sent.

## 2019-05-21 ENCOUNTER — Other Ambulatory Visit: Payer: Self-pay

## 2019-05-21 ENCOUNTER — Ambulatory Visit: Payer: Medicare Other | Admitting: Speech Pathology

## 2019-05-21 ENCOUNTER — Encounter: Payer: Self-pay | Admitting: Speech Pathology

## 2019-05-21 ENCOUNTER — Ambulatory Visit: Payer: Medicare Other

## 2019-05-21 DIAGNOSIS — M6281 Muscle weakness (generalized): Secondary | ICD-10-CM

## 2019-05-21 DIAGNOSIS — R490 Dysphonia: Secondary | ICD-10-CM

## 2019-05-21 DIAGNOSIS — Z9181 History of falling: Secondary | ICD-10-CM

## 2019-05-21 DIAGNOSIS — R262 Difficulty in walking, not elsewhere classified: Secondary | ICD-10-CM
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
## 2019-05-24 ENCOUNTER — Encounter: Payer: Medicare Other | Admitting: Speech Pathology

## 2019-05-28 ENCOUNTER — Ambulatory Visit: Payer: Medicare Other

## 2019-05-28 ENCOUNTER — Encounter: Payer: Self-pay | Admitting: Speech Pathology

## 2019-05-28 ENCOUNTER — Ambulatory Visit: Payer: Medicare Other | Admitting: Speech Pathology

## 2019-05-28 ENCOUNTER — Other Ambulatory Visit: Payer: Self-pay

## 2019-05-28 DIAGNOSIS — R262 Difficulty in walking, not elsewhere classified: Secondary | ICD-10-CM

## 2019-05-28 DIAGNOSIS — R490 Dysphonia: Secondary | ICD-10-CM

## 2019-05-28 DIAGNOSIS — Z9181 History of falling: Secondary | ICD-10-CM

## 2019-05-28 DIAGNOSIS — M6281 Muscle weakness (generalized): Secondary | ICD-10-CM
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
## 2019-05-31 ENCOUNTER — Other Ambulatory Visit: Payer: Self-pay

## 2019-05-31 ENCOUNTER — Ambulatory Visit: Payer: Medicare Other | Admitting: Speech Pathology

## 2019-05-31 ENCOUNTER — Ambulatory Visit: Payer: Medicare Other | Attending: Neurology

## 2019-05-31 DIAGNOSIS — Z9181 History of falling: Secondary | ICD-10-CM | POA: Insufficient documentation

## 2019-05-31 DIAGNOSIS — M6281 Muscle weakness (generalized): Secondary | ICD-10-CM | POA: Diagnosis present

## 2019-05-31 DIAGNOSIS — R262 Difficulty in walking, not elsewhere classified: Secondary | ICD-10-CM | POA: Insufficient documentation
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
## 2019-06-05 ENCOUNTER — Ambulatory Visit: Payer: Medicare Other

## 2019-06-07 ENCOUNTER — Ambulatory Visit: Payer: Medicare Other

## 2019-06-12 ENCOUNTER — Ambulatory Visit: Payer: Medicare Other

## 2019-06-19 ENCOUNTER — Ambulatory Visit: Payer: Medicare Other

## 2019-06-20 ENCOUNTER — Other Ambulatory Visit: Payer: Self-pay | Admitting: Internal Medicine

## 2019-06-20 DIAGNOSIS — R296 Repeated falls: Secondary | ICD-10-CM

## 2019-06-29 ENCOUNTER — Other Ambulatory Visit: Payer: Self-pay

## 2019-06-29 ENCOUNTER — Ambulatory Visit
Admission: RE | Admit: 2019-06-29 | Discharge: 2019-06-29 | Disposition: A | Discharge status: Home / Self Care | Payer: Medicare Other | Source: Ambulatory Visit | Attending: Internal Medicine | Admitting: Internal Medicine

## 2019-06-29 DIAGNOSIS — R296 Repeated falls: Secondary | ICD-10-CM

## 2019-07-19 ENCOUNTER — Other Ambulatory Visit: Payer: Self-pay | Admitting: Neurology

## 2019-07-19 DIAGNOSIS — R633 Feeding difficulties, unspecified: Secondary | ICD-10-CM

## 2019-07-19 DIAGNOSIS — R1313 Dysphagia, pharyngeal phase: Secondary | ICD-10-CM

## 2019-07-19 DIAGNOSIS — K219 Gastro-esophageal reflux disease without esophagitis: Secondary | ICD-10-CM

## 2019-07-19 DIAGNOSIS — R1310 Dysphagia, unspecified: Secondary | ICD-10-CM

## 2019-07-19 DIAGNOSIS — R1319 Other dysphagia: Secondary | ICD-10-CM

## 2019-07-30 ENCOUNTER — Other Ambulatory Visit: Payer: Self-pay | Admitting: Urology

## 2019-07-30 MED ORDER — SILDENAFIL CITRATE 20 MG PO TABS: ORAL_TABLET | ORAL | 0 refills | Status: DC

## 2019-07-30 NOTE — Telephone Encounter (Signed)
Patient aware rx was sent to pharmacy. 

## 2019-07-30 NOTE — Telephone Encounter (Signed)
rx sent

## 2019-07-30 NOTE — Telephone Encounter (Signed)
Pt asking for refill of his Sildenafil 20mg  to be sent to [facility] pharmacy. Please advise. Thanks.

## 2019-08-01 ENCOUNTER — Other Ambulatory Visit: Payer: Self-pay | Admitting: Neurology

## 2019-08-01 DIAGNOSIS — R1313 Dysphagia, pharyngeal phase: Secondary | ICD-10-CM

## 2019-08-08 ENCOUNTER — Telehealth: Payer: Self-pay

## 2019-08-08 NOTE — Telephone Encounter (Signed)
Patient called requesting more sildenafil.  He says he is taking more and its not working.  Recommended patient make an appointment.  Patient hung up before scheduling.

## 2019-08-08 NOTE — Telephone Encounter (Signed)
Patient called saying pharmacy did not have sildenafil rx.  Per pharmacy patient picked medication up on 11/2.  Left message informing patient that rx was picked up 11/2.

## 2019-08-16 ENCOUNTER — Ambulatory Visit (INDEPENDENT_AMBULATORY_CARE_PROVIDER_SITE_OTHER): Payer: Medicare Other | Admitting: Urology

## 2019-08-16 ENCOUNTER — Encounter: Payer: Self-pay | Admitting: Urology

## 2019-08-16 ENCOUNTER — Other Ambulatory Visit: Payer: Self-pay

## 2019-08-16 VITALS — BP 183/79 | HR 93 | Ht 68.0 in | Wt 200.0 lb

## 2019-08-16 DIAGNOSIS — N5201 Erectile dysfunction due to arterial insufficiency: Secondary | ICD-10-CM | POA: Diagnosis not present
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
## 2019-08-17 ENCOUNTER — Ambulatory Visit: Payer: Medicare Other

## 2019-09-05 NOTE — Progress Notes (Signed)
Kevin Bonilla presents today for a EDEX titration.  He is no longer spontaneous erections.   He has had moderate response to PDE5i's.  He denies any history of sickle cell anemia or trait, a history of multiple myeloma or a history of leukemia.  He has not taken trazodone or a PDE5i's today.   Patient's left corpus cavernosum is identified.  An area near the base of the penis is cleansed with rubbing alcohol.  Careful to avoid the dorsal vein, 2 mcg of Edex 10 mcg (Lot # [id number] # 03/2020) is injected at a 90 degree angle into the left corpus cavernosum near the base of the penis.  Patient experienced penile fullness in 15 minutes.    We discussed having another injection of the Edex, but he deferred.  He stated he would rather go ahead and order the Trimix and and return for Trimix titration.  Advised patient of the condition of priapism, painful erection lasting for more than four hours, and to contact the office immediately or seek treatment in the ED

## 2019-09-06 ENCOUNTER — Other Ambulatory Visit: Payer: Self-pay

## 2019-09-06 ENCOUNTER — Ambulatory Visit (INDEPENDENT_AMBULATORY_CARE_PROVIDER_SITE_OTHER): Payer: Medicare Other | Admitting: Urology

## 2019-09-06 ENCOUNTER — Encounter: Payer: Self-pay | Admitting: Urology

## 2019-09-06 ENCOUNTER — Telehealth: Payer: Self-pay | Admitting: Urology

## 2019-09-06 VITALS — BP 138/70 | HR 72 | Ht 68.0 in | Wt 193.0 lb

## 2019-09-06 DIAGNOSIS — N5201 Erectile dysfunction due to arterial insufficiency: Secondary | ICD-10-CM

## 2019-09-06 MED ORDER — NONFORMULARY OR COMPOUNDED ITEM: 0 refills | Status: DC

## 2019-09-06 NOTE — Telephone Encounter (Signed)
Trimix rx printed and faxed to [facility].

## 2019-09-06 NOTE — Telephone Encounter (Signed)
Would you send a prescription for the test vials of the Trimix to [facility]?

## 2019-09-07 ENCOUNTER — Other Ambulatory Visit: Payer: Self-pay | Admitting: Neurology

## 2019-09-07 DIAGNOSIS — R1313 Dysphagia, pharyngeal phase: Secondary | ICD-10-CM

## 2019-09-07 DIAGNOSIS — R633 Feeding difficulties, unspecified: Secondary | ICD-10-CM

## 2019-09-10 DIAGNOSIS — G231 Progressive supranuclear ophthalmoplegia [Steele-Richardson-Olszewski]: Secondary | ICD-10-CM | POA: Insufficient documentation

## 2019-09-13 ENCOUNTER — Telehealth: Payer: Self-pay | Admitting: Urology

## 2019-09-13 NOTE — Telephone Encounter (Signed)
Patient called and left a voicemail asking for a called back the clinical staff has been trying to call him back several times, he did call back and advised me that he was trying to give himself the trimix injection and when I explained that he needed to wait until his injection training app on 09-26-19 he got anger and hung up.   Sharyn Lull

## 2019-09-13 NOTE — Telephone Encounter (Signed)
Spoke to patient and informed not to inject himself. He voiced understanding. I apologized that there is not an appointment available sooner due to the holidays. He will come to his apponitment on 12/30 for Trimix teaching.

## 2019-09-13 NOTE — Telephone Encounter (Signed)
Pt. Called back at 10:32 requesting someone to call him as soon as possible

## 2019-09-13 NOTE — Telephone Encounter (Signed)
Pt has questions about his Trimix injection.  Can you give him a call please?

## 2019-09-25 NOTE — Progress Notes (Signed)
Mr. Graeff presents today for a Trimix titration.  His BP was uncontrolled > 140/90, so we could not proceed with the titration.  He is rescheduled and encouraged to contact his PCP for better HTN management.

## 2019-09-26 ENCOUNTER — Ambulatory Visit (INDEPENDENT_AMBULATORY_CARE_PROVIDER_SITE_OTHER): Payer: Medicare Other | Admitting: Urology

## 2019-09-26 ENCOUNTER — Other Ambulatory Visit: Payer: Self-pay

## 2019-09-26 ENCOUNTER — Encounter: Payer: Self-pay | Admitting: Urology

## 2019-09-26 ENCOUNTER — Ambulatory Visit: Admission: RE | Admit: 2019-09-26 | Payer: Medicare Other | Source: Ambulatory Visit

## 2019-09-26 VITALS — BP 184/84 | HR 76 | Ht 68.0 in | Wt 193.0 lb

## 2019-09-26 DIAGNOSIS — N5201 Erectile dysfunction due to arterial insufficiency: Secondary | ICD-10-CM

## 2019-10-08 NOTE — Progress Notes (Signed)
Kevin Bonilla presents today for a Trimix titration.  His BP was still elevated and he was asked calm down as he was extremely agitated by staff.  He then became belligerent and started using profane language the staff.  I then went to speak with Kevin Bonilla.  He stated he was angry regarding his BP and he was here to get his "profane word for erection."  I explained that I understood that and if he would just calm down, we would retake his BP and will likely be able to proceed with his visit today.  He refused to calm down, stating he has to be this way.  He just had to be angry and it was impossible to calm down.  He then said he wanted to see a doctor and that wanted to have an appointment made for him with Dr. Lonna Cobb.   An appointment was made for him with Dr. Lonna Cobb.

## 2019-10-09 ENCOUNTER — Ambulatory Visit (INDEPENDENT_AMBULATORY_CARE_PROVIDER_SITE_OTHER): Payer: Medicare Other | Admitting: Urology

## 2019-10-09 ENCOUNTER — Other Ambulatory Visit: Payer: Self-pay

## 2019-10-09 DIAGNOSIS — N5201 Erectile dysfunction due to arterial insufficiency: Secondary | ICD-10-CM

## 2019-10-11 ENCOUNTER — Ambulatory Visit (INDEPENDENT_AMBULATORY_CARE_PROVIDER_SITE_OTHER): Payer: Medicare Other | Admitting: Urology

## 2019-10-11 ENCOUNTER — Other Ambulatory Visit: Payer: Self-pay

## 2019-10-11 ENCOUNTER — Encounter: Payer: Self-pay | Admitting: Urology

## 2019-10-11 ENCOUNTER — Ambulatory Visit: Payer: Medicare Other | Admitting: Urology

## 2019-10-11 VITALS — BP 170/84 | HR 67 | Ht 67.0 in | Wt 193.0 lb

## 2019-10-11 DIAGNOSIS — N5201 Erectile dysfunction due to arterial insufficiency: Secondary | ICD-10-CM
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
## 2020-05-28 ENCOUNTER — Other Ambulatory Visit: Payer: Self-pay | Admitting: Orthopedic Surgery

## 2020-05-28 DIAGNOSIS — M25512 Pain in left shoulder: Secondary | ICD-10-CM

## 2020-05-28 DIAGNOSIS — G8929 Other chronic pain: Secondary | ICD-10-CM

## 2020-06-18 ENCOUNTER — Other Ambulatory Visit: Payer: Self-pay

## 2020-06-18 ENCOUNTER — Ambulatory Visit
Admission: RE | Admit: 2020-06-18 | Discharge: 2020-06-18 | Disposition: A | Discharge status: Home / Self Care | Payer: Medicare Other | Source: Ambulatory Visit | Attending: Orthopedic Surgery | Admitting: Orthopedic Surgery

## 2020-06-18 DIAGNOSIS — M25512 Pain in left shoulder: Secondary | ICD-10-CM | POA: Diagnosis present

## 2020-06-18 DIAGNOSIS — G8929 Other chronic pain: Secondary | ICD-10-CM | POA: Diagnosis present
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
## 2020-09-04 ENCOUNTER — Ambulatory Visit (INDEPENDENT_AMBULATORY_CARE_PROVIDER_SITE_OTHER): Payer: Medicare Other | Admitting: Urology

## 2020-09-04 ENCOUNTER — Other Ambulatory Visit: Payer: Self-pay

## 2020-09-04 ENCOUNTER — Encounter: Payer: Self-pay | Admitting: Urology

## 2020-09-04 VITALS — BP 149/84 | HR 70 | Ht 69.0 in | Wt 200.0 lb

## 2020-09-04 DIAGNOSIS — N3941 Urge incontinence: Secondary | ICD-10-CM

## 2020-09-04 LAB — BLADDER SCAN AMB NON-IMAGING: Scan Result: 147

## 2020-09-04 MED ORDER — TAMSULOSIN HCL 0.4 MG PO CAPS: 0.4000 mg | ORAL_CAPSULE | Freq: Every day | ORAL | 3 refills | Status: AC
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
## 2020-10-09 ENCOUNTER — Ambulatory Visit: Payer: Self-pay | Admitting: Urology

## 2020-10-09 DIAGNOSIS — N3941 Urge incontinence: Secondary | ICD-10-CM

## 2020-10-26 ENCOUNTER — Encounter: Payer: Self-pay | Admitting: Emergency Medicine

## 2020-10-26 ENCOUNTER — Emergency Department
Admission: EM | Admit: 2020-10-26 | Discharge: 2020-10-26 | Disposition: A | Discharge status: Home / Self Care | Payer: Medicare Other | Attending: Emergency Medicine | Admitting: Emergency Medicine

## 2020-10-26 ENCOUNTER — Other Ambulatory Visit: Payer: Self-pay

## 2020-10-26 ENCOUNTER — Emergency Department: Payer: Medicare Other

## 2020-10-26 DIAGNOSIS — Z7982 Long term (current) use of aspirin: Secondary | ICD-10-CM | POA: Insufficient documentation

## 2020-10-26 DIAGNOSIS — S0993XA Unspecified injury of face, initial encounter: Secondary | ICD-10-CM | POA: Diagnosis present

## 2020-10-26 DIAGNOSIS — Z9660 Presence of unspecified orthopedic joint implant: Secondary | ICD-10-CM | POA: Insufficient documentation

## 2020-10-26 DIAGNOSIS — W1830XA Fall on same level, unspecified, initial encounter: Secondary | ICD-10-CM | POA: Insufficient documentation

## 2020-10-26 DIAGNOSIS — Y9389 Activity, other specified: Secondary | ICD-10-CM | POA: Insufficient documentation

## 2020-10-26 DIAGNOSIS — W19XXXA Unspecified fall, initial encounter: Secondary | ICD-10-CM

## 2020-10-26 DIAGNOSIS — Z79899 Other long term (current) drug therapy: Secondary | ICD-10-CM | POA: Diagnosis not present

## 2020-10-26 DIAGNOSIS — Z87891 Personal history of nicotine dependence: Secondary | ICD-10-CM | POA: Insufficient documentation

## 2020-10-26 DIAGNOSIS — J45909 Unspecified asthma, uncomplicated: Secondary | ICD-10-CM | POA: Diagnosis not present

## 2020-10-26 DIAGNOSIS — I10 Essential (primary) hypertension: Secondary | ICD-10-CM | POA: Diagnosis not present

## 2020-10-26 DIAGNOSIS — S0083XA Contusion of other part of head, initial encounter: Secondary | ICD-10-CM | POA: Diagnosis not present

## 2020-10-26 DIAGNOSIS — E119 Type 2 diabetes mellitus without complications: Secondary | ICD-10-CM | POA: Diagnosis not present

## 2020-10-26 DIAGNOSIS — G20 Parkinson's disease: Secondary | ICD-10-CM | POA: Diagnosis not present

## 2020-10-26 NOTE — ED Triage Notes (Signed)
Pt in via EMS from a facility with c/o fall on the bed. Pt went to stand and get in his WC and fell on the bed. Pt is mute. Pt denies injuries

## 2020-10-26 NOTE — ED Notes (Signed)
Per patient's wife, patient was sent to the ED by the [facility] due to frequent falls this week.  Wife states patient's mental status and mobility are at baseline.  Patient is unable to answer questions coherently at this time.

## 2020-10-26 NOTE — ED Triage Notes (Signed)
See first RN note, on review of chart pt is currently at [facility], pt confirms he is at the [facility] of [address] Pt denies any pain. Pt able to answer yes/no questions with nodding/shaking his head. Pt with noted abrasions to his face, denies they are from fall today. Pt denies any injury from fall today. Pt alert, VSS in triage.
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
## 2020-10-26 NOTE — Discharge Instructions (Signed)
Follow-up with your regular primary care doctor if any continued problems or concerns.  Watch abrasions for any signs of infection and clean daily with mild soap and water.  Continue regular medications.  CT scan shows arthritis of the neck and also a very small sinus fracture which appears to be old.  You may also use cool compresses to his face to help with any swelling.  Return to the emergency department if any severe worsening of his symptoms or urgent concerns.

## 2020-11-23 ENCOUNTER — Other Ambulatory Visit: Payer: Self-pay

## 2020-11-23 ENCOUNTER — Emergency Department: Payer: Medicare Other

## 2020-11-23 ENCOUNTER — Emergency Department
Admission: EM | Admit: 2020-11-23 | Discharge: 2020-11-23 | Disposition: A | Discharge status: Home / Self Care | Payer: Medicare Other | Attending: Emergency Medicine | Admitting: Emergency Medicine

## 2020-11-23 DIAGNOSIS — W19XXXA Unspecified fall, initial encounter: Secondary | ICD-10-CM | POA: Diagnosis not present

## 2020-11-23 DIAGNOSIS — Z7984 Long term (current) use of oral hypoglycemic drugs: Secondary | ICD-10-CM | POA: Insufficient documentation

## 2020-11-23 DIAGNOSIS — J45909 Unspecified asthma, uncomplicated: Secondary | ICD-10-CM | POA: Diagnosis not present

## 2020-11-23 DIAGNOSIS — E119 Type 2 diabetes mellitus without complications: Secondary | ICD-10-CM | POA: Insufficient documentation

## 2020-11-23 DIAGNOSIS — S3991XA Unspecified injury of abdomen, initial encounter: Secondary | ICD-10-CM | POA: Insufficient documentation

## 2020-11-23 DIAGNOSIS — N179 Acute kidney failure, unspecified: Secondary | ICD-10-CM | POA: Diagnosis not present

## 2020-11-23 DIAGNOSIS — S4992XA Unspecified injury of left shoulder and upper arm, initial encounter: Secondary | ICD-10-CM | POA: Diagnosis present

## 2020-11-23 DIAGNOSIS — R918 Other nonspecific abnormal finding of lung field: Secondary | ICD-10-CM | POA: Diagnosis not present

## 2020-11-23 DIAGNOSIS — Z20822 Contact with and (suspected) exposure to covid-19: Secondary | ICD-10-CM | POA: Insufficient documentation

## 2020-11-23 DIAGNOSIS — Z79899 Other long term (current) drug therapy: Secondary | ICD-10-CM | POA: Diagnosis not present

## 2020-11-23 DIAGNOSIS — T1490XA Injury, unspecified, initial encounter: Secondary | ICD-10-CM

## 2020-11-23 DIAGNOSIS — R569 Unspecified convulsions: Secondary | ICD-10-CM | POA: Insufficient documentation

## 2020-11-23 DIAGNOSIS — G20 Parkinson's disease: Secondary | ICD-10-CM | POA: Diagnosis not present

## 2020-11-23 DIAGNOSIS — Z87891 Personal history of nicotine dependence: Secondary | ICD-10-CM | POA: Insufficient documentation

## 2020-11-23 DIAGNOSIS — Z9660 Presence of unspecified orthopedic joint implant: Secondary | ICD-10-CM | POA: Insufficient documentation

## 2020-11-23 DIAGNOSIS — Z7982 Long term (current) use of aspirin: Secondary | ICD-10-CM | POA: Diagnosis not present

## 2020-11-23 DIAGNOSIS — E876 Hypokalemia: Secondary | ICD-10-CM | POA: Diagnosis not present

## 2020-11-23 DIAGNOSIS — I10 Essential (primary) hypertension: Secondary | ICD-10-CM | POA: Insufficient documentation

## 2020-11-23 DIAGNOSIS — S43005A Unspecified dislocation of left shoulder joint, initial encounter: Secondary | ICD-10-CM

## 2020-11-23 LAB — CBC WITH DIFFERENTIAL/PLATELET
Abs Immature Granulocytes: 0.08 10*3/uL — ABNORMAL HIGH (ref 0.00–0.07)
Basophils Absolute: 0.1 10*3/uL (ref 0.0–0.1)
Basophils Relative: 1 %
Eosinophils Absolute: 0.1 10*3/uL (ref 0.0–0.5)
Eosinophils Relative: 2 %
HCT: 38.8 % — ABNORMAL LOW (ref 39.0–52.0)
Hemoglobin: 12 g/dL — ABNORMAL LOW (ref 13.0–17.0)
Immature Granulocytes: 1 %
Lymphocytes Relative: 13 %
Lymphs Abs: 1.1 10*3/uL (ref 0.7–4.0)
MCH: 27.1 pg (ref 26.0–34.0)
MCHC: 30.9 g/dL (ref 30.0–36.0)
MCV: 87.8 fL (ref 80.0–100.0)
Monocytes Absolute: 0.7 10*3/uL (ref 0.1–1.0)
Monocytes Relative: 9 %
Neutro Abs: 6.3 10*3/uL (ref 1.7–7.7)
Neutrophils Relative %: 74 %
Platelets: 204 10*3/uL (ref 150–400)
RBC: 4.42 MIL/uL (ref 4.22–5.81)
RDW: 13.9 % (ref 11.5–15.5)
WBC: 8.4 10*3/uL (ref 4.0–10.5)
nRBC: 0 % (ref 0.0–0.2)

## 2020-11-23 LAB — C DIFFICILE (CDIFF) QUICK SCRN (NO PCR REFLEX)
C Diff antigen: POSITIVE — AB
C Diff toxin: NEGATIVE

## 2020-11-23 LAB — BASIC METABOLIC PANEL
Anion gap: 10 (ref 5–15)
BUN: 21 mg/dL (ref 8–23)
CO2: 26 mmol/L (ref 22–32)
Calcium: 8.4 mg/dL — ABNORMAL LOW (ref 8.9–10.3)
Chloride: 103 mmol/L (ref 98–111)
Creatinine, Ser: 1.5 mg/dL — ABNORMAL HIGH (ref 0.61–1.24)
GFR, Estimated: 51 mL/min — ABNORMAL LOW (ref >60)
Glucose, Bld: 102 mg/dL — ABNORMAL HIGH (ref 70–99)
Potassium: 2.9 mmol/L — ABNORMAL LOW (ref 3.5–5.1)
Sodium: 139 mmol/L (ref 135–145)

## 2020-11-23 LAB — GASTROINTESTINAL PANEL BY PCR, STOOL (REPLACES STOOL CULTURE)

## 2020-11-23 LAB — HEPATIC FUNCTION PANEL
ALT: <5 U/L (ref 0–44)
AST: 12 U/L — ABNORMAL LOW (ref 15–41)
Albumin: 3.8 g/dL (ref 3.5–5.0)
Alkaline Phosphatase: 62 U/L (ref 38–126)
Bilirubin, Direct: 0.2 mg/dL (ref 0.0–0.2)
Indirect Bilirubin: 0.8 mg/dL (ref 0.3–0.9)
Total Bilirubin: 1 mg/dL (ref 0.3–1.2)
Total Protein: 6 g/dL — ABNORMAL LOW (ref 6.5–8.1)

## 2020-11-23 LAB — RESP PANEL BY RT-PCR (FLU A&B, COVID) ARPGX2
Influenza A by PCR: NEGATIVE
Influenza B by PCR: NEGATIVE
SARS Coronavirus 2 by RT PCR: NEGATIVE

## 2020-11-23 LAB — URINALYSIS, COMPLETE (UACMP) WITH MICROSCOPIC
Bacteria, UA: NONE SEEN
Bilirubin Urine: NEGATIVE
Glucose, UA: NEGATIVE mg/dL
Hgb urine dipstick: NEGATIVE
Ketones, ur: 5 mg/dL — AB
Leukocytes,Ua: NEGATIVE
Nitrite: NEGATIVE
Protein, ur: 30 mg/dL — AB
Specific Gravity, Urine: 1.039 — ABNORMAL HIGH (ref 1.005–1.030)
Squamous Epithelial / HPF: NONE SEEN (ref 0–5)
pH: 6 (ref 5.0–8.0)

## 2020-11-23 LAB — TROPONIN I (HIGH SENSITIVITY): Troponin I (High Sensitivity): 15 ng/L (ref <18)

## 2020-11-23 LAB — CK: Total CK: 185 U/L (ref 49–397)

## 2020-11-23 LAB — MAGNESIUM: Magnesium: 2 mg/dL (ref 1.7–2.4)

## 2020-11-23 MED ORDER — IOHEXOL 300 MG/ML  SOLN
100.0000 mL | Freq: Once | INTRAMUSCULAR | Status: AC | PRN
  Administered 2020-11-23: 100 mL via INTRAVENOUS

## 2020-11-23 MED ORDER — SODIUM CHLORIDE 0.9 % IV BOLUS
500.0000 mL | Freq: Once | INTRAVENOUS | Status: AC
  Administered 2020-11-23: 500 mL via INTRAVENOUS

## 2020-11-23 MED ORDER — POTASSIUM CHLORIDE 10 MEQ/100ML IV SOLN
10.0000 meq | Freq: Once | INTRAVENOUS | Status: AC
  Administered 2020-11-23: 10 meq via INTRAVENOUS
  Filled 2020-11-23: qty 100

## 2020-11-23 MED ORDER — POTASSIUM CHLORIDE CRYS ER 20 MEQ PO TBCR
40.0000 meq | EXTENDED_RELEASE_TABLET | Freq: Once | ORAL | Status: AC
  Administered 2020-11-23: 40 meq via ORAL
  Filled 2020-11-23: qty 2

## 2020-11-23 NOTE — ED Triage Notes (Signed)
Pt brought in via EMS from [facility] [place] [facility] for unwitnessed multiple falls.  EMS states on arrival, pt appeared to have seizure like activity, focal in nature, was not postictal afterwards.  Pt covered in feces, EMS reports stool was all over pt room, grounded in carpet.  Pt has diffuse bruising  and abrasions to face.  CBG 145, VS stable en route.

## 2020-11-23 NOTE — ED Provider Notes (Addendum)
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 obtained. Risks and benefits: risks, benefits and alternatives were discussed Consent given by: spouse Patient understanding: patient states understanding of the procedure being performed Patient identity confirmed: verbally with patient  Sedation: Patient sedated: no  Comments: Attempted reduction by both myself and another provider unable to reduce shoulder with multiple attempts.      ____________________________________________   INITIAL IMPRESSION / ASSESSMENT AND PLAN / ED COURSE  Kevin Bonilla was evaluated in Emergency Department on 11/23/2020 for the symptoms described in the history of present illness. He was evaluated in  the context of the global COVID-19 pandemic, which necessitated consideration that the patient might be at risk for infection with the SARS-CoV-2 virus that causes COVID-19. Institutional protocols and algorithms that pertain to the evaluation of patients at risk for COVID-19 are in a state of rapid change based on information released by regulatory bodies including the CDC and federal and state organizations. These policies and algorithms were followed during the patient's care in the ED.    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  Patient is able to nod yes or no but is difficult to assess 100% what is hurting on him.  He is got multiple bruises all over him.  Given her difficulty in assessing exactly what is hurting we will proceed with pan scan to further evaluate to evaluate for intracranial hemorrhage, cervical fracture, rib fractures, pneumothorax, retroperitoneal hematoma, other abdominal pathology.  GI panel ordered due to pt being covered in stool. Unclear if could be c diff.   Unclear with the shaking episode was.  Patient is not hypoxic.  Will get EKG to evaluate for arrhythmia keep patient on the cardiac monitor.  Sounds less likely to be seizure given he was not postictal but difficult to know for sure since we were not there to witness this event.  Labs show potassium of 2.9.  Patient is getting some IV potassium.  Creatinine is 1.5 is already got some fluids.  No evidence of rhabdo.  Troponin was slightly elevated at 15 but low suspicion for ACS at this time.   CT head was negative there was concern of possible infection versus fragment of tooth missing. No signs of infection. Does have fracture teeth chronic per wife.  CT shows a anterior dislocation  of the left shoulder with hematoma on the left arm.  Xrays confirm dislocation.  However upon examination patient is able to move the arm  fully.  He has a history of rotator cuff tear.  I did attempt to relocate the shoulder x2 as well as having to the provider attempt but we were unable to relocate it.  Did discuss with the facility who stated that they noticed that the shoulder looked out about 1 to 2 weeks ago and they called EMS but because he was able to move and had no pain they thought it was in place.  According to the wife his shoulder always has looked like that.  Was able to have Dr. Allena Katz review his prior MRI back in October it did look in place.  I had a further discussion with the wife at bedside about need to go to OR to put it back in versus no intervention given he is not having any pain or mobility issues secondary to it being out.  Given his progressive neurological decline she does not want additional work-up done at this time and would like to be discharged home.  CT scan also showed some lucencies in the mouth but wife states that he is always had some little fractures of his teeth no signs of infection.  There is also concern for some groundglass opacity in the left lower lobe but patient has no white count and his oxygen levels normal.  At this time I do not think he has proctitis.  His C. difficile test was positive but toxin was negative.  His bowel movements have not looked like C. difficile but I did call the facility and they are going to monitor him for increased stool frequency.  I talked to facility and they did not say there was a seizure. He just had some tremors in his arms which sounds like baseline for him and he had missed one of his parkinsons meds. They are okay with pt returning to facility.   D/w wife and given his poor neurological outcome want to avoid significant nterventions and are okay with dc back to facility at this time.      ____________________________________________   FINAL CLINICAL IMPRESSION(S) / ED DIAGNOSES   Final diagnoses:  Trauma  Dislocated shoulder, left, initial  encounter  AKI (acute kidney injury) (HCC)  Hypokalemia      MEDICATIONS GIVEN DURING THIS VISIT:  Medications  potassium chloride SA (KLOR-CON) CR tablet 40 mEq (has no administration in time range)  sodium chloride 0.9 % bolus 500 mL (0 mLs Intravenous Stopped 11/23/20 1700)  iohexol (OMNIPAQUE) 300 MG/ML solution 100 mL (100 mLs Intravenous Contrast Given 11/23/20 1659)  potassium chloride 10 mEq in 100 mL IVPB (0 mEq Intravenous Stopped 11/23/20 1935)     ED Discharge Orders    None       Note:  This document was prepared using Dragon voice recognition software and may include unintentional dictation errors.   Concha Se, MD 11/24/20 1032    Concha Se, MD 12/09/20 [phone]

## 2020-11-23 NOTE — Discharge Instructions (Signed)
  We were unable to get the left shoulder back in place but family did not want further intervention done given he still able to use the arm and does not have any pain.  His C. difficile test was positive but this could just be from a prior infection.  It was toxin negative meaning less likely an acute infection however if he starts developing multiple episodes of diarrhea daily he should be started on C. difficile treatment.     1. Anterior dislocation of the left shoulder.  2. Possible corner avulsion fracture off of the superior left  glenoid.  3. 5.8 cm likely traumatic intramuscular fluid collection lateral to  the displaced left humeral head.  4. Scattered ground-glass opacities throughout the left lower lobe,  posttraumatic versus infectious/inflammatory.  5. No evidence of acute traumatic injury to the abdomen or pelvis.  6. Diffuse circumferential rectal wall thickening with perirectal  inflammatory fat stranding. Given the long segment of involvement  and symmetric circumferential thickening, favor infectious or  inflammatory causes.

## 2020-11-23 NOTE — ED Notes (Signed)
Report called to [place], Med Tech @ [address]  Transport arranged through [employer] operational officer.

## 2020-11-23 NOTE — ED Notes (Signed)
[facility] to transport to [facility].

## 2020-12-04 ENCOUNTER — Emergency Department: Payer: Medicare Other

## 2020-12-04 ENCOUNTER — Other Ambulatory Visit: Payer: Self-pay

## 2020-12-04 ENCOUNTER — Emergency Department
Admission: EM | Admit: 2020-12-04 | Discharge: 2020-12-04 | Disposition: A | Discharge status: Home / Self Care | Payer: Medicare Other | Attending: Emergency Medicine | Admitting: Emergency Medicine

## 2020-12-04 DIAGNOSIS — E119 Type 2 diabetes mellitus without complications: Secondary | ICD-10-CM

## 2020-12-04 DIAGNOSIS — J45909 Unspecified asthma, uncomplicated: Secondary | ICD-10-CM | POA: Diagnosis not present

## 2020-12-04 DIAGNOSIS — Z8673 Personal history of transient ischemic attack (TIA), and cerebral infarction without residual deficits: Secondary | ICD-10-CM | POA: Diagnosis not present

## 2020-12-04 DIAGNOSIS — Z7982 Long term (current) use of aspirin: Secondary | ICD-10-CM | POA: Diagnosis not present

## 2020-12-04 DIAGNOSIS — G20 Parkinson's disease: Secondary | ICD-10-CM | POA: Insufficient documentation

## 2020-12-04 DIAGNOSIS — Z20822 Contact with and (suspected) exposure to covid-19: Secondary | ICD-10-CM | POA: Diagnosis not present

## 2020-12-04 DIAGNOSIS — G231 Progressive supranuclear ophthalmoplegia [Steele-Richardson-Olszewski]: Secondary | ICD-10-CM | POA: Diagnosis not present

## 2020-12-04 DIAGNOSIS — Z7984 Long term (current) use of oral hypoglycemic drugs: Secondary | ICD-10-CM | POA: Diagnosis not present

## 2020-12-04 DIAGNOSIS — Z87891 Personal history of nicotine dependence: Secondary | ICD-10-CM | POA: Diagnosis not present

## 2020-12-04 DIAGNOSIS — I739 Peripheral vascular disease, unspecified: Secondary | ICD-10-CM | POA: Diagnosis not present

## 2020-12-04 DIAGNOSIS — I10 Essential (primary) hypertension: Secondary | ICD-10-CM | POA: Diagnosis not present

## 2020-12-04 DIAGNOSIS — E1140 Type 2 diabetes mellitus with diabetic neuropathy, unspecified: Secondary | ICD-10-CM | POA: Diagnosis not present

## 2020-12-04 DIAGNOSIS — Z79899 Other long term (current) drug therapy: Secondary | ICD-10-CM | POA: Insufficient documentation

## 2020-12-04 DIAGNOSIS — T402X5A Adverse effect of other opioids, initial encounter: Secondary | ICD-10-CM

## 2020-12-04 DIAGNOSIS — R4182 Altered mental status, unspecified: Secondary | ICD-10-CM | POA: Diagnosis present

## 2020-12-04 DIAGNOSIS — Z794 Long term (current) use of insulin: Secondary | ICD-10-CM | POA: Diagnosis not present

## 2020-12-04 DIAGNOSIS — G20A1 Parkinson's disease without dyskinesia, without mention of fluctuations: Secondary | ICD-10-CM

## 2020-12-04 LAB — COMPREHENSIVE METABOLIC PANEL
ALT: <5 U/L (ref 0–44)
AST: 9 U/L — ABNORMAL LOW (ref 15–41)
Albumin: 3.7 g/dL (ref 3.5–5.0)
Alkaline Phosphatase: 66 U/L (ref 38–126)
Anion gap: 10 (ref 5–15)
BUN: 24 mg/dL — ABNORMAL HIGH (ref 8–23)
CO2: 28 mmol/L (ref 22–32)
Calcium: 8.7 mg/dL — ABNORMAL LOW (ref 8.9–10.3)
Chloride: 98 mmol/L (ref 98–111)
Creatinine, Ser: 1.33 mg/dL — ABNORMAL HIGH (ref 0.61–1.24)
GFR, Estimated: 59 mL/min — ABNORMAL LOW (ref >60)
Glucose, Bld: 102 mg/dL — ABNORMAL HIGH (ref 70–99)
Potassium: 3.4 mmol/L — ABNORMAL LOW (ref 3.5–5.1)
Sodium: 136 mmol/L (ref 135–145)
Total Bilirubin: 1.1 mg/dL (ref 0.3–1.2)
Total Protein: 6 g/dL — ABNORMAL LOW (ref 6.5–8.1)

## 2020-12-04 LAB — CBC WITH DIFFERENTIAL/PLATELET
Abs Immature Granulocytes: 0.07 10*3/uL (ref 0.00–0.07)
Basophils Absolute: 0.1 10*3/uL (ref 0.0–0.1)
Basophils Relative: 1 %
Eosinophils Absolute: 0.4 10*3/uL (ref 0.0–0.5)
Eosinophils Relative: 7 %
HCT: 32.7 % — ABNORMAL LOW (ref 39.0–52.0)
Hemoglobin: 10.5 g/dL — ABNORMAL LOW (ref 13.0–17.0)
Immature Granulocytes: 1 %
Lymphocytes Relative: 21 %
Lymphs Abs: 1.4 10*3/uL (ref 0.7–4.0)
MCH: 28 pg (ref 26.0–34.0)
MCHC: 32.1 g/dL (ref 30.0–36.0)
MCV: 87.2 fL (ref 80.0–100.0)
Monocytes Absolute: 0.4 10*3/uL (ref 0.1–1.0)
Monocytes Relative: 7 %
Neutro Abs: 4.3 10*3/uL (ref 1.7–7.7)
Neutrophils Relative %: 63 %
Platelets: 230 10*3/uL (ref 150–400)
RBC: 3.75 MIL/uL — ABNORMAL LOW (ref 4.22–5.81)
RDW: 14.6 % (ref 11.5–15.5)
WBC: 6.7 10*3/uL (ref 4.0–10.5)
nRBC: 0 % (ref 0.0–0.2)

## 2020-12-04 LAB — SALICYLATE LEVEL: Salicylate Lvl: <7 mg/dL — ABNORMAL LOW (ref 7.0–30.0)

## 2020-12-04 LAB — MAGNESIUM: Magnesium: 2.2 mg/dL (ref 1.7–2.4)

## 2020-12-04 LAB — TSH: TSH: 1.129 u[IU]/mL (ref 0.350–4.500)

## 2020-12-04 LAB — RESP PANEL BY RT-PCR (FLU A&B, COVID) ARPGX2
Influenza A by PCR: NEGATIVE
Influenza B by PCR: NEGATIVE
SARS Coronavirus 2 by RT PCR: NEGATIVE

## 2020-12-04 LAB — ETHANOL: Alcohol, Ethyl (B): <10 mg/dL (ref <10)

## 2020-12-04 LAB — ACETAMINOPHEN LEVEL: Acetaminophen (Tylenol), Serum: <10 ug/mL — ABNORMAL LOW (ref 10–30)

## 2020-12-04 LAB — PROTIME-INR
INR: 1 (ref 0.8–1.2)
Prothrombin Time: 12.3 seconds (ref 11.4–15.2)

## 2020-12-04 LAB — CK: Total CK: 234 U/L (ref 49–397)

## 2020-12-04 LAB — TROPONIN I (HIGH SENSITIVITY): Troponin I (High Sensitivity): 12 ng/L (ref <18)

## 2020-12-04 LAB — LIPASE, BLOOD: Lipase: 21 U/L (ref 11–51)

## 2020-12-04 MED ORDER — CARBIDOPA-LEVODOPA 25-100 MG PO TABS
3.0000 | ORAL_TABLET | Freq: Four times a day (QID) | ORAL | Status: DC
  Administered 2020-12-04: 3 via ORAL
  Filled 2020-12-04 (×4): qty 3

## 2020-12-04 MED ORDER — LACTATED RINGERS IV BOLUS
1000.0000 mL | Freq: Once | INTRAVENOUS | Status: AC
  Administered 2020-12-04: 1000 mL via INTRAVENOUS

## 2020-12-04 NOTE — Discharge Instructions (Addendum)
Your CT scan of the head, chest x-ray, and lab test today were all okay.  We gave your noontime dose of Sinemet (carbidopa/levodopa).  Symptoms appear to be due to sedating side effects of oxycodone

## 2020-12-04 NOTE — ED Notes (Signed)
[facility]  CALLED  FOR  TRANSPORT  TO  [facility]  

## 2020-12-04 NOTE — ED Triage Notes (Signed)
Pt presents to the [facility] via EMS from the [facility] [address] with c/o altered mental status. EMS states that family visited pt and had difficulty arousing pt. The [facility] staff state that pt received 10mg  oxycodone prior to arrival of family. Pt currently alert but nonverbal at baseline.
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
## 2021-04-05 ENCOUNTER — Inpatient Hospital Stay
Admission: EM | Admit: 2021-04-05 | Discharge: 2021-04-13 | DRG: 872 | Disposition: A | Discharge status: Skilled Nursing Facility | Payer: Medicare Other | Attending: Internal Medicine | Admitting: Internal Medicine

## 2021-04-05 DIAGNOSIS — Z20822 Contact with and (suspected) exposure to covid-19: Secondary | ICD-10-CM | POA: Diagnosis present

## 2021-04-05 DIAGNOSIS — R296 Repeated falls: Secondary | ICD-10-CM | POA: Diagnosis present

## 2021-04-05 DIAGNOSIS — R197 Diarrhea, unspecified: Secondary | ICD-10-CM

## 2021-04-05 DIAGNOSIS — M4854XA Collapsed vertebra, not elsewhere classified, thoracic region, initial encounter for fracture: Secondary | ICD-10-CM | POA: Diagnosis present

## 2021-04-05 DIAGNOSIS — Z66 Do not resuscitate: Secondary | ICD-10-CM | POA: Diagnosis present

## 2021-04-05 DIAGNOSIS — A419 Sepsis, unspecified organism: Secondary | ICD-10-CM | POA: Diagnosis not present

## 2021-04-05 DIAGNOSIS — F0280 Dementia in other diseases classified elsewhere without behavioral disturbance: Secondary | ICD-10-CM | POA: Diagnosis present

## 2021-04-05 DIAGNOSIS — L8990 Pressure ulcer of unspecified site, unspecified stage: Secondary | ICD-10-CM | POA: Insufficient documentation

## 2021-04-05 DIAGNOSIS — M5134 Other intervertebral disc degeneration, thoracic region: Secondary | ICD-10-CM | POA: Diagnosis present

## 2021-04-05 DIAGNOSIS — G20 Parkinson's disease: Secondary | ICD-10-CM | POA: Diagnosis present

## 2021-04-05 DIAGNOSIS — Z888 Allergy status to other drugs, medicaments and biological substances status: Secondary | ICD-10-CM

## 2021-04-05 DIAGNOSIS — R569 Unspecified convulsions: Secondary | ICD-10-CM | POA: Diagnosis present

## 2021-04-05 DIAGNOSIS — Z79899 Other long term (current) drug therapy: Secondary | ICD-10-CM

## 2021-04-05 DIAGNOSIS — L89151 Pressure ulcer of sacral region, stage 1: Secondary | ICD-10-CM | POA: Diagnosis present

## 2021-04-05 DIAGNOSIS — E876 Hypokalemia: Secondary | ICD-10-CM | POA: Diagnosis not present

## 2021-04-05 DIAGNOSIS — E119 Type 2 diabetes mellitus without complications: Secondary | ICD-10-CM | POA: Diagnosis present

## 2021-04-05 DIAGNOSIS — G894 Chronic pain syndrome: Secondary | ICD-10-CM | POA: Diagnosis present

## 2021-04-05 DIAGNOSIS — K529 Noninfective gastroenteritis and colitis, unspecified: Secondary | ICD-10-CM | POA: Diagnosis not present

## 2021-04-05 DIAGNOSIS — Z87891 Personal history of nicotine dependence: Secondary | ICD-10-CM

## 2021-04-05 DIAGNOSIS — S22060A Wedge compression fracture of T7-T8 vertebra, initial encounter for closed fracture: Secondary | ICD-10-CM

## 2021-04-05 DIAGNOSIS — F429 Obsessive-compulsive disorder, unspecified: Secondary | ICD-10-CM | POA: Diagnosis present

## 2021-04-05 DIAGNOSIS — E861 Hypovolemia: Secondary | ICD-10-CM | POA: Diagnosis present

## 2021-04-05 DIAGNOSIS — M5030 Other cervical disc degeneration, unspecified cervical region: Secondary | ICD-10-CM | POA: Diagnosis present

## 2021-04-05 DIAGNOSIS — I959 Hypotension, unspecified: Secondary | ICD-10-CM

## 2021-04-05 DIAGNOSIS — Z8673 Personal history of transient ischemic attack (TIA), and cerebral infarction without residual deficits: Secondary | ICD-10-CM

## 2021-04-05 DIAGNOSIS — E785 Hyperlipidemia, unspecified: Secondary | ICD-10-CM | POA: Diagnosis present

## 2021-04-05 DIAGNOSIS — R6520 Severe sepsis without septic shock: Secondary | ICD-10-CM | POA: Diagnosis present

## 2021-04-05 DIAGNOSIS — I10 Essential (primary) hypertension: Secondary | ICD-10-CM | POA: Diagnosis present

## 2021-04-05 DIAGNOSIS — Z7982 Long term (current) use of aspirin: Secondary | ICD-10-CM

## 2021-04-05 DIAGNOSIS — Z8249 Family history of ischemic heart disease and other diseases of the circulatory system: Secondary | ICD-10-CM

## 2021-04-05 DIAGNOSIS — K219 Gastro-esophageal reflux disease without esophagitis: Secondary | ICD-10-CM | POA: Diagnosis present

## 2021-04-05 DIAGNOSIS — F419 Anxiety disorder, unspecified: Secondary | ICD-10-CM | POA: Diagnosis present

## 2021-04-05 DIAGNOSIS — J449 Chronic obstructive pulmonary disease, unspecified: Secondary | ICD-10-CM | POA: Diagnosis present

## 2021-04-05 DIAGNOSIS — Z79890 Hormone replacement therapy (postmenopausal): Secondary | ICD-10-CM

## 2021-04-05 DIAGNOSIS — G4700 Insomnia, unspecified: Secondary | ICD-10-CM | POA: Diagnosis present

## 2021-04-05 DIAGNOSIS — Z7984 Long term (current) use of oral hypoglycemic drugs: Secondary | ICD-10-CM

## 2021-04-05 LAB — CBC WITH DIFFERENTIAL/PLATELET
Abs Immature Granulocytes: 0.12 10*3/uL — ABNORMAL HIGH (ref 0.00–0.07)
Basophils Absolute: 0.1 10*3/uL (ref 0.0–0.1)
Basophils Relative: 1 %
Eosinophils Absolute: 0.2 10*3/uL (ref 0.0–0.5)
Eosinophils Relative: 1 %
HCT: 38.4 % — ABNORMAL LOW (ref 39.0–52.0)
Hemoglobin: 11.7 g/dL — ABNORMAL LOW (ref 13.0–17.0)
Immature Granulocytes: 1 %
Lymphocytes Relative: 6 %
Lymphs Abs: 1 10*3/uL (ref 0.7–4.0)
MCH: 26.8 pg (ref 26.0–34.0)
MCHC: 30.5 g/dL (ref 30.0–36.0)
MCV: 87.9 fL (ref 80.0–100.0)
Monocytes Absolute: 0.3 10*3/uL (ref 0.1–1.0)
Monocytes Relative: 2 %
Neutro Abs: 14.4 10*3/uL — ABNORMAL HIGH (ref 1.7–7.7)
Neutrophils Relative %: 89 %
Platelets: 333 10*3/uL (ref 150–400)
RBC: 4.37 MIL/uL (ref 4.22–5.81)
RDW: 15.4 % (ref 11.5–15.5)
WBC: 16.1 10*3/uL — ABNORMAL HIGH (ref 4.0–10.5)
nRBC: 0 % (ref 0.0–0.2)

## 2021-04-05 MED ORDER — SODIUM CHLORIDE 0.9 % IV BOLUS
1000.0000 mL | Freq: Once | INTRAVENOUS | Status: AC
  Administered 2021-04-05: 1000 mL via INTRAVENOUS

## 2021-04-05 MED ORDER — LACTATED RINGERS IV BOLUS
1000.0000 mL | Freq: Once | INTRAVENOUS | Status: AC
  Administered 2021-04-05: 1000 mL via INTRAVENOUS

## 2021-04-05 NOTE — ED Triage Notes (Signed)
Pt presents from [facility] due to a seizure like activity - Pt had LOC for 30secs. Pt has had numerous diarrhea episodes all day. Pt is non-verbal @ baseline.
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
## 2021-04-06 ENCOUNTER — Emergency Department: Payer: Medicare Other

## 2021-04-06 ENCOUNTER — Encounter: Payer: Self-pay | Admitting: Internal Medicine

## 2021-04-06 DIAGNOSIS — R569 Unspecified convulsions: Secondary | ICD-10-CM | POA: Diagnosis present

## 2021-04-06 DIAGNOSIS — K529 Noninfective gastroenteritis and colitis, unspecified: Secondary | ICD-10-CM | POA: Diagnosis present

## 2021-04-06 DIAGNOSIS — F419 Anxiety disorder, unspecified: Secondary | ICD-10-CM | POA: Diagnosis present

## 2021-04-06 DIAGNOSIS — E861 Hypovolemia: Secondary | ICD-10-CM | POA: Diagnosis present

## 2021-04-06 DIAGNOSIS — Z79899 Other long term (current) drug therapy: Secondary | ICD-10-CM | POA: Diagnosis not present

## 2021-04-06 DIAGNOSIS — R296 Repeated falls: Secondary | ICD-10-CM | POA: Diagnosis present

## 2021-04-06 DIAGNOSIS — M5030 Other cervical disc degeneration, unspecified cervical region: Secondary | ICD-10-CM | POA: Diagnosis present

## 2021-04-06 DIAGNOSIS — F429 Obsessive-compulsive disorder, unspecified: Secondary | ICD-10-CM | POA: Diagnosis present

## 2021-04-06 DIAGNOSIS — Z20822 Contact with and (suspected) exposure to covid-19: Secondary | ICD-10-CM | POA: Diagnosis present

## 2021-04-06 DIAGNOSIS — I959 Hypotension, unspecified: Secondary | ICD-10-CM

## 2021-04-06 DIAGNOSIS — A419 Sepsis, unspecified organism: Secondary | ICD-10-CM | POA: Diagnosis present

## 2021-04-06 DIAGNOSIS — S22060A Wedge compression fracture of T7-T8 vertebra, initial encounter for closed fracture: Secondary | ICD-10-CM

## 2021-04-06 DIAGNOSIS — E119 Type 2 diabetes mellitus without complications: Secondary | ICD-10-CM | POA: Diagnosis present

## 2021-04-06 DIAGNOSIS — J449 Chronic obstructive pulmonary disease, unspecified: Secondary | ICD-10-CM | POA: Diagnosis present

## 2021-04-06 DIAGNOSIS — E785 Hyperlipidemia, unspecified: Secondary | ICD-10-CM | POA: Diagnosis present

## 2021-04-06 DIAGNOSIS — G894 Chronic pain syndrome: Secondary | ICD-10-CM | POA: Diagnosis present

## 2021-04-06 DIAGNOSIS — I10 Essential (primary) hypertension: Secondary | ICD-10-CM | POA: Diagnosis present

## 2021-04-06 DIAGNOSIS — R6520 Severe sepsis without septic shock: Secondary | ICD-10-CM | POA: Diagnosis present

## 2021-04-06 DIAGNOSIS — M5134 Other intervertebral disc degeneration, thoracic region: Secondary | ICD-10-CM | POA: Diagnosis present

## 2021-04-06 DIAGNOSIS — R197 Diarrhea, unspecified: Secondary | ICD-10-CM | POA: Diagnosis not present

## 2021-04-06 DIAGNOSIS — M4854XA Collapsed vertebra, not elsewhere classified, thoracic region, initial encounter for fracture: Secondary | ICD-10-CM | POA: Diagnosis present

## 2021-04-06 DIAGNOSIS — G4700 Insomnia, unspecified: Secondary | ICD-10-CM | POA: Diagnosis present

## 2021-04-06 DIAGNOSIS — Z8673 Personal history of transient ischemic attack (TIA), and cerebral infarction without residual deficits: Secondary | ICD-10-CM | POA: Diagnosis not present

## 2021-04-06 DIAGNOSIS — K219 Gastro-esophageal reflux disease without esophagitis: Secondary | ICD-10-CM | POA: Diagnosis present

## 2021-04-06 DIAGNOSIS — L89151 Pressure ulcer of sacral region, stage 1: Secondary | ICD-10-CM | POA: Diagnosis present

## 2021-04-06 DIAGNOSIS — F0280 Dementia in other diseases classified elsewhere without behavioral disturbance: Secondary | ICD-10-CM | POA: Diagnosis present

## 2021-04-06 DIAGNOSIS — G20 Parkinson's disease: Secondary | ICD-10-CM | POA: Diagnosis present

## 2021-04-06 LAB — GASTROINTESTINAL PANEL BY PCR, STOOL (REPLACES STOOL CULTURE)

## 2021-04-06 LAB — GLUCOSE, CAPILLARY
Glucose-Capillary: 97 mg/dL (ref 70–99)
Glucose-Capillary: 84 mg/dL (ref 70–99)
Glucose-Capillary: 80 mg/dL (ref 70–99)
Glucose-Capillary: 70 mg/dL (ref 70–99)

## 2021-04-06 LAB — BASIC METABOLIC PANEL
Anion gap: 7 (ref 5–15)
BUN: 32 mg/dL — ABNORMAL HIGH (ref 8–23)
CO2: 27 mmol/L (ref 22–32)
Calcium: 8 mg/dL — ABNORMAL LOW (ref 8.9–10.3)
Chloride: 105 mmol/L (ref 98–111)
Creatinine, Ser: 1.09 mg/dL (ref 0.61–1.24)
GFR, Estimated: >60 mL/min (ref >60)
Glucose, Bld: 115 mg/dL — ABNORMAL HIGH (ref 70–99)
Potassium: 3.7 mmol/L (ref 3.5–5.1)
Sodium: 139 mmol/L (ref 135–145)

## 2021-04-06 LAB — PROTIME-INR
INR: 1.1 (ref 0.8–1.2)
Prothrombin Time: 13.9 seconds (ref 11.4–15.2)

## 2021-04-06 LAB — MAGNESIUM: Magnesium: 2.2 mg/dL (ref 1.7–2.4)

## 2021-04-06 LAB — CBC
HCT: 36.5 % — ABNORMAL LOW (ref 39.0–52.0)
Hemoglobin: 11.8 g/dL — ABNORMAL LOW (ref 13.0–17.0)
MCH: 26.9 pg (ref 26.0–34.0)
MCHC: 32.3 g/dL (ref 30.0–36.0)
MCV: 83.1 fL (ref 80.0–100.0)
Platelets: 248 10*3/uL (ref 150–400)
RBC: 4.39 MIL/uL (ref 4.22–5.81)
RDW: 15.5 % (ref 11.5–15.5)
WBC: 10.4 10*3/uL (ref 4.0–10.5)
nRBC: 0 % (ref 0.0–0.2)

## 2021-04-06 LAB — PROCALCITONIN: Procalcitonin: 1.9 ng/mL

## 2021-04-06 LAB — COMPREHENSIVE METABOLIC PANEL
ALT: <5 U/L (ref 0–44)
AST: 15 U/L (ref 15–41)
Albumin: 3.3 g/dL — ABNORMAL LOW (ref 3.5–5.0)
Alkaline Phosphatase: 55 U/L (ref 38–126)
Anion gap: 7 (ref 5–15)
BUN: 31 mg/dL — ABNORMAL HIGH (ref 8–23)
CO2: 26 mmol/L (ref 22–32)
Calcium: 8.3 mg/dL — ABNORMAL LOW (ref 8.9–10.3)
Chloride: 105 mmol/L (ref 98–111)
Creatinine, Ser: 1.23 mg/dL (ref 0.61–1.24)
GFR, Estimated: >60 mL/min (ref >60)
Glucose, Bld: 218 mg/dL — ABNORMAL HIGH (ref 70–99)
Potassium: 5.5 mmol/L — ABNORMAL HIGH (ref 3.5–5.1)
Sodium: 138 mmol/L (ref 135–145)
Total Bilirubin: 1.3 mg/dL — ABNORMAL HIGH (ref 0.3–1.2)
Total Protein: 5 g/dL — ABNORMAL LOW (ref 6.5–8.1)

## 2021-04-06 LAB — URINALYSIS, COMPLETE (UACMP) WITH MICROSCOPIC
Bacteria, UA: NONE SEEN
Glucose, UA: NEGATIVE mg/dL
Hgb urine dipstick: NEGATIVE
Ketones, ur: 5 mg/dL — AB
Leukocytes,Ua: NEGATIVE
Nitrite: NEGATIVE
Protein, ur: 30 mg/dL — AB
Specific Gravity, Urine: 1.027 (ref 1.005–1.030)
Squamous Epithelial / HPF: NONE SEEN (ref 0–5)
WBC, UA: NONE SEEN WBC/hpf (ref 0–5)
pH: 5 (ref 5.0–8.0)

## 2021-04-06 LAB — C DIFFICILE QUICK SCREEN W PCR REFLEX
C Diff antigen: NEGATIVE
C Diff interpretation: NOT DETECTED
C Diff toxin: NEGATIVE

## 2021-04-06 LAB — CORTISOL-AM, BLOOD: Cortisol - AM: 33.5 ug/dL — ABNORMAL HIGH (ref 6.7–22.6)

## 2021-04-06 LAB — MRSA NEXT GEN BY PCR, NASAL: MRSA by PCR Next Gen: NOT DETECTED

## 2021-04-06 LAB — LACTIC ACID, PLASMA
Lactic Acid, Venous: 2.8 mmol/L (ref 0.5–1.9)
Lactic Acid, Venous: 4 mmol/L (ref 0.5–1.9)
Lactic Acid, Venous: 1.6 mmol/L (ref 0.5–1.9)
Lactic Acid, Venous: 2.7 mmol/L (ref 0.5–1.9)

## 2021-04-06 LAB — RESP PANEL BY RT-PCR (FLU A&B, COVID) ARPGX2
Influenza A by PCR: NEGATIVE
Influenza B by PCR: NEGATIVE
SARS Coronavirus 2 by RT PCR: NEGATIVE

## 2021-04-06 LAB — HEMOGLOBIN A1C
Hgb A1c MFr Bld: 5.1 % (ref 4.8–5.6)
Mean Plasma Glucose: 99.67 mg/dL

## 2021-04-06 LAB — HIV ANTIBODY (ROUTINE TESTING W REFLEX): HIV Screen 4th Generation wRfx: NONREACTIVE

## 2021-04-06 MED ORDER — SODIUM CHLORIDE 0.9 % IV SOLN
2.0000 g | Freq: Two times a day (BID) | INTRAVENOUS | Status: DC
  Administered 2021-04-06: 2 g via INTRAVENOUS
  Filled 2021-04-06 (×2): qty 2

## 2021-04-06 MED ORDER — INSULIN ASPART 100 UNIT/ML IJ SOLN: 0.0000 [IU] | Freq: Every day | INTRAMUSCULAR | Status: DC

## 2021-04-06 MED ORDER — LACTATED RINGERS IV BOLUS
500.0000 mL | Freq: Once | INTRAVENOUS | Status: AC
  Administered 2021-04-06: 500 mL via INTRAVENOUS

## 2021-04-06 MED ORDER — SODIUM CHLORIDE 0.9 % IV SOLN
2.0000 g | Freq: Once | INTRAVENOUS | Status: AC
  Administered 2021-04-06: 2 g via INTRAVENOUS
  Filled 2021-04-06: qty 20

## 2021-04-06 MED ORDER — IOHEXOL 300 MG/ML  SOLN
100.0000 mL | Freq: Once | INTRAMUSCULAR | Status: AC | PRN
  Administered 2021-04-06: 100 mL via INTRAVENOUS

## 2021-04-06 MED ORDER — ACETAMINOPHEN 325 MG PO TABS: 650.0000 mg | ORAL_TABLET | Freq: Four times a day (QID) | ORAL | Status: DC | PRN

## 2021-04-06 MED ORDER — ENOXAPARIN SODIUM 40 MG/0.4ML IJ SOSY
40.0000 mg | PREFILLED_SYRINGE | INTRAMUSCULAR | Status: DC
  Administered 2021-04-06 – 2021-04-13 (×8): 40 mg via SUBCUTANEOUS
  Filled 2021-04-06 (×8): qty 0.4

## 2021-04-06 MED ORDER — ONDANSETRON HCL 4 MG PO TABS: 4.0000 mg | ORAL_TABLET | Freq: Four times a day (QID) | ORAL | Status: DC | PRN

## 2021-04-06 MED ORDER — METRONIDAZOLE 500 MG/100ML IV SOLN
500.0000 mg | Freq: Three times a day (TID) | INTRAVENOUS | Status: DC
  Administered 2021-04-06 – 2021-04-11 (×15): 500 mg via INTRAVENOUS
  Filled 2021-04-06 (×18): qty 100

## 2021-04-06 MED ORDER — INSULIN ASPART 100 UNIT/ML IJ SOLN
0.0000 [IU] | Freq: Three times a day (TID) | INTRAMUSCULAR | Status: DC
  Administered 2021-04-08 – 2021-04-12 (×2): 1 [IU] via SUBCUTANEOUS
  Filled 2021-04-06 (×2): qty 1

## 2021-04-06 MED ORDER — LACTATED RINGERS IV SOLN: INTRAVENOUS | Status: AC

## 2021-04-06 MED ORDER — SODIUM CHLORIDE 0.9 % IV SOLN
2.0000 g | Freq: Three times a day (TID) | INTRAVENOUS | Status: DC
  Administered 2021-04-06 – 2021-04-11 (×15): 2 g via INTRAVENOUS
  Filled 2021-04-06 (×18): qty 2

## 2021-04-06 MED ORDER — LACTATED RINGERS IV BOLUS: 1000.0000 mL | Freq: Once | INTRAVENOUS | Status: DC

## 2021-04-06 MED ORDER — ACETAMINOPHEN 650 MG RE SUPP: 650.0000 mg | Freq: Four times a day (QID) | RECTAL | Status: DC | PRN

## 2021-04-06 MED ORDER — METRONIDAZOLE 500 MG/100ML IV SOLN
500.0000 mg | Freq: Once | INTRAVENOUS | Status: AC
  Administered 2021-04-06: 500 mg via INTRAVENOUS
  Filled 2021-04-06 (×2): qty 100

## 2021-04-06 MED ORDER — ONDANSETRON HCL 4 MG/2ML IJ SOLN: 4.0000 mg | Freq: Four times a day (QID) | INTRAMUSCULAR | Status: DC | PRN

## 2021-04-06 NOTE — Progress Notes (Signed)
Pharmacy Antibiotic Note  Kevin Bonilla is a 70 y.o. male w/ PMH of Parkinson's disease, nonverbal at baseline, COPD, HTN, DM admitted on 04/05/2021 with severe diarrhea related to colitis.  Pharmacy has been consulted for cefepime dosing.   Plan: start cefepime 2 grams IV every 12 hours Follow renal function for needed dose adjustments  Height: 5\' 8"  (172.7 cm) Weight: 82 kg (180 lb 12.4 oz) IBW/kg (Calculated) : 68.4  Temp (24hrs), Avg:98.8 F (37.1 C), Min:98.8 F (37.1 C), Max:98.8 F (37.1 C)  Recent Labs  Lab 04/05/21 2328 04/05/21 2336 04/06/21 0143  WBC  --  16.1*  --   CREATININE  --  1.23  --   LATICACIDVEN 2.8*  --  4.0*    Estimated Creatinine Clearance: 56.4 mL/min (by C-G formula based on SCr of 1.23 mg/dL).    Allergies  Allergen Reactions   Lisinopril Nausea Only    Panic attacks   Lisinopril Nausea Only and Other (See Comments)    Panic attacks   Buspirone Palpitations    Per cardiology, low HR    Antimicrobials this admission: cefepime 07/11 >>  metronidazole 07/11 >>   Microbiology results: 07/11 BCx: pending 07/10 C diff: negative 07/10 SARS CoV-2: negative 07/10 influenza A/B: negative  Thank you for allowing pharmacy to be a part of this patient's care.  09/10 04/06/2021 2:40 AM

## 2021-04-06 NOTE — ED Notes (Signed)
Pt presents covered in dried feces from facility. Spoke with staff who stated that the patient was in the middle of getting a shower when the episode began and they called 911.   Pt was cleansed and provided a clean gown, brief, and several chucks underneath the patient.

## 2021-04-06 NOTE — ED Notes (Signed)
Pt provided linen change, peri care, brief change.

## 2021-04-06 NOTE — TOC Initial Note (Signed)
Transition of Care [facility]) - Initial/Assessment Note    Patient Details  Name: Kevin Bonilla MRN: [id number] Date of Birth: 1951-03-19  Transition of Care [facility]) CM/SW Contact:    Gildardo Griffes, LCSW Phone Number: 04/06/2021, 12:10 PM  Clinical Narrative:                  CSW spoke with Verlon Au at [facility] who reports patient is long term resident there.   She reports for additional background information no patient, due to him being non verbal, staff can call Crystal at [phone]. MD made aware.   Expected Discharge Plan: Skilled Nursing Facility Barriers to Discharge: Continued Medical Work up   Patient Goals and CMS Choice   CMS Medicare.gov Compare Post Acute Care list provided to:: Patient Represenative (must comment) Verlon Au at [facility])    Expected Discharge Plan and Services Expected Discharge Plan: Skilled Nursing Facility       Living arrangements for the past 2 months: Skilled Nursing Facility Environmental consultant)                                      Prior Living Arrangements/Services Living arrangements for the past 2 months: Skilled Nursing Facility Environmental consultant) Lives with:: Facility Resident                   Activities of Daily Living   ADL Screening (condition at time of admission) Patient's cognitive ability adequate to safely complete daily activities?: No Is the patient deaf or have difficulty hearing?: No Does the patient have difficulty seeing, even when wearing glasses/contacts?: No Does the patient have difficulty concentrating, remembering, or making decisions?: Yes Patient able to express need for assistance with ADLs?: Yes Does the patient have difficulty dressing or bathing?: Yes Independently performs ADLs?: Yes (appropriate for developmental age) Does the patient have difficulty walking or climbing stairs?: Yes Weakness of Legs: Both Weakness of Arms/Hands: None  Permission Sought/Granted                   Emotional Assessment              Admission diagnosis:  Acute colitis [K52.9] Diarrhea, unspecified type [R19.7] Sepsis without acute organ dysfunction, due to unspecified organism [facility]) [A41.9] Patient Active Problem List   Diagnosis Date Noted   Acute colitis 04/06/2021   Compression fracture of T8 vertebra (HCC) 04/06/2021   Severe sepsis (HCC) 04/06/2021   Hypotension 04/06/2021   COPD (chronic obstructive pulmonary disease) (HCC) 04/06/2021   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/10/2019   Erectile dysfunction due to arterial insufficiency 04/24/2019   Major depressive disorder, recurrent, in partial remission (HCC) 01/15/2019   Recurrent falls 01/15/2019   Lung nodule 09/13/2018   Moderate episode of recurrent major depressive disorder (HCC) 09/13/2018   Ambulatory dysfunction 05/10/2018   Heart murmur, systolic 05/10/2018   Parkinson's disease (HCC) 01/10/2018   DDD (degenerative disc disease), cervical 10/19/2017   DDD (degenerative disc disease), thoracic 10/19/2017   Osteoarthritis 10/19/2017   Chronic myofascial pain (Between shoulder blades/rhomboids) (Bilateral) 10/19/2017   Cervical radiculitis (Bilateral) 10/19/2017   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) 10/19/2017   Depression 10/18/2017   Diabetes mellitus type 2, uncomplicated (HCC) 10/18/2017   Heart murmur 10/18/2017   Pharmacologic therapy 10/18/2017   Problems influencing health status 10/18/2017   Long term prescription opiate use 10/18/2017   Opiate use 10/18/2017  Chronic upper back pain (midline) 10/18/2017   Cervical Anterolisthesis (2.5 cm) (C4 on C5) 10/18/2017   Closed fracture of ribs (1-8), sequela (Right) 10/18/2017   Osteoarthritis of shoulder (Right) 10/18/2017   Osteoarthritis of ankle (Right) 10/18/2017   Peripheral vascular disease (HCC) 10/18/2017   Chronic thoracic back pain (Primary Area of Pain) (Midline) 10/18/2017   Chronic hip pain (Secondary Area  of Pain) (Bilateral) (L>R) 10/18/2017   NSAID long-term use 10/18/2017   Disorder of skeletal system 10/03/2017   Other long term (current) drug therapy 10/03/2017   Seizure-like activity (HCC) 10/03/2017   Chronic pain syndrome 10/03/2017   Chronic lower extremity pain (Left) 06/22/2017   Trochanteric bursitis, left hip 06/22/2017   TIA (transient ischemic attack) 03/19/2017   Closed intertrochanteric fracture of femur, sequela 01/09/2017   Fall from horse 01/09/2017   History of hyperlipidemia 11/14/2015   Lumbar spondylosis 11/03/2015   Hyponatremia 10/01/2015   Hypo-osmolality and hyponatremia 10/01/2015   Anxiety disorder, unspecified 05/26/2015   Diabetes (HCC) 05/26/2015   Osteoarthritis of knee (Right) 05/26/2015   Obesity, unspecified 05/26/2015   Essential (primary) hypertension 02/26/2015   Gastro-esophageal reflux disease with esophagitis 05/08/2010   Obsessive-compulsive disorder 03/10/2009   Insomnia 03/10/2009   Hyperglyceridemia, pure 09/27/2006   ED (erectile dysfunction) of organic origin 08/27/2005   PCP:  Barbette Reichmann, MD Pharmacy:   [address] [place], [place] - [address] [place] [address] Phone: [phone] Fax: [phone]  [facility] - [place], [place] - [address] [place] [address] Phone: [phone] Fax: [phone]     Social Determinants of Health (SDOH) Interventions    Readmission Risk Interventions No flowsheet data found.

## 2021-04-06 NOTE — Progress Notes (Signed)
Elink communicated with Dr. Para March, additional lactic acid order received

## 2021-04-06 NOTE — ED Notes (Signed)
Pt provided pericare and brief change due to BM

## 2021-04-06 NOTE — Progress Notes (Signed)
Unable to complete admission documentation as pt is nonverbal at baseline. Mitts applied due to pt attempting to pull out IV lines. Bed in lowest position with call bell in reach and bed alarm on.   Lamonte Richer, RN
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
## 2021-04-06 NOTE — Progress Notes (Signed)
CODE SEPSIS - PHARMACY COMMUNICATION  **Broad Spectrum Antibiotics should be administered within 1 hour of Sepsis diagnosis**  Time Code Sepsis Called/Page Received: 0041  Antibiotics Ordered: ceftriaxone and metronidazole  Time of 1st antibiotic administration: 0111  Additional action taken by pharmacy: none required  If necessary, Name of Provider/Nurse Contacted: N/A    Kevin Bonilla ,PharmD Clinical Pharmacist  04/06/2021  12:55 AM

## 2021-04-06 NOTE — Progress Notes (Addendum)
Patient ID: Kevin Bonilla, male   DOB: [birth year], 70 y.o.   MRN: [id number] This is a no charge note as patient was admitted this AM.  Patient was seen and examined.  H&P reviewed.   Kevin Bonilla is a 70 y.o. male with medical history significant for Parkinson's disease, nonverbal at baseline, COPD, HTN, DM with no history of seizure disorder brought in by EMS for evaluation of seizure-like episode that occurred after a several hour history of copious diarrhea.  History is limited as patient is nonverbal and is taken mostly from ED records and EMS report.  CT abdomen and pelvis consistent with colitis.  Also seen was T8 compression fracture that had a chronic appearance.  This am pt is sleeping, does not open his eyes or respond . Hands in mittens. Cta anteriorly Regular s1/s2  Soft +bs No edema   A/P Continue iv abx. GI consulted -Dr. Tobi Bastos - input appreciated. Avoid narcotics as can worsen small bowel dilation and decreased colonic nuclear small bowel transit time. Serial abdominal exam to look for worsening of abdominal discomfort/distention and if he does get worse may need repeat imaging with possible surgery consult next week 5.  No acute indication to perform any endoscopic procedure at this point of time.  If does not improve in 24-48 hours then can consider sigmoidoscopy C diff negative Ck stool GI panel   Called wife several times, did not pickup. Vm came on.

## 2021-04-06 NOTE — Progress Notes (Signed)
[facility] following for sepsis protocol. 

## 2021-04-06 NOTE — ED Notes (Signed)
Pt provided pericare. Pt removed brief at this time - additional chucks in place. ED provider differed flexi-seal tube at this time.

## 2021-04-07 ENCOUNTER — Other Ambulatory Visit: Payer: Self-pay

## 2021-04-07 ENCOUNTER — Inpatient Hospital Stay: Payer: Medicare Other

## 2021-04-07 DIAGNOSIS — R197 Diarrhea, unspecified: Secondary | ICD-10-CM | POA: Diagnosis not present

## 2021-04-07 LAB — BASIC METABOLIC PANEL
Anion gap: 8 (ref 5–15)
BUN: 25 mg/dL — ABNORMAL HIGH (ref 8–23)
CO2: 28 mmol/L (ref 22–32)
Calcium: 8.5 mg/dL — ABNORMAL LOW (ref 8.9–10.3)
Chloride: 103 mmol/L (ref 98–111)
Creatinine, Ser: 0.94 mg/dL (ref 0.61–1.24)
GFR, Estimated: >60 mL/min (ref >60)
Glucose, Bld: 110 mg/dL — ABNORMAL HIGH (ref 70–99)
Potassium: 3.1 mmol/L — ABNORMAL LOW (ref 3.5–5.1)
Sodium: 139 mmol/L (ref 135–145)

## 2021-04-07 LAB — GLUCOSE, CAPILLARY
Glucose-Capillary: 101 mg/dL — ABNORMAL HIGH (ref 70–99)
Glucose-Capillary: 57 mg/dL — ABNORMAL LOW (ref 70–99)
Glucose-Capillary: 84 mg/dL (ref 70–99)
Glucose-Capillary: 87 mg/dL (ref 70–99)
Glucose-Capillary: 90 mg/dL (ref 70–99)
Glucose-Capillary: 92 mg/dL (ref 70–99)
Glucose-Capillary: 93 mg/dL (ref 70–99)
Glucose-Capillary: 94 mg/dL (ref 70–99)

## 2021-04-07 LAB — LACTIC ACID, PLASMA: Lactic Acid, Venous: 0.9 mmol/L (ref 0.5–1.9)

## 2021-04-07 MED ORDER — LACTATED RINGERS IV SOLN: INTRAVENOUS | Status: DC

## 2021-04-07 MED ORDER — DIVALPROEX SODIUM 250 MG PO DR TAB
250.0000 mg | DELAYED_RELEASE_TABLET | Freq: Two times a day (BID) | ORAL | Status: DC
  Administered 2021-04-07 – 2021-04-13 (×12): 250 mg via ORAL
  Filled 2021-04-07 (×13): qty 1

## 2021-04-07 MED ORDER — HALOPERIDOL LACTATE 5 MG/ML IJ SOLN
1.0000 mg | Freq: Once | INTRAMUSCULAR | Status: AC | PRN
  Administered 2021-04-07: 1 mg via INTRAVENOUS
  Filled 2021-04-07: qty 1

## 2021-04-07 MED ORDER — GABAPENTIN 300 MG PO CAPS
300.0000 mg | ORAL_CAPSULE | Freq: Three times a day (TID) | ORAL | Status: DC
  Administered 2021-04-07 – 2021-04-13 (×17): 300 mg via ORAL
  Filled 2021-04-07 (×17): qty 1

## 2021-04-07 MED ORDER — POTASSIUM CHLORIDE 20 MEQ PO PACK
40.0000 meq | PACK | Freq: Two times a day (BID) | ORAL | Status: AC
  Administered 2021-04-07: 40 meq via ORAL
  Filled 2021-04-07: qty 2

## 2021-04-07 MED ORDER — QUETIAPINE FUMARATE 25 MG PO TABS
50.0000 mg | ORAL_TABLET | Freq: Every day | ORAL | Status: DC
  Administered 2021-04-07 – 2021-04-12 (×6): 50 mg via ORAL
  Filled 2021-04-07 (×6): qty 2

## 2021-04-07 MED ORDER — HALOPERIDOL LACTATE 5 MG/ML IJ SOLN: 1.0000 mg | Freq: Once | INTRAMUSCULAR | Status: DC

## 2021-04-07 MED ORDER — DEXTROSE 50 % IV SOLN
12.5000 g | INTRAVENOUS | Status: AC
  Administered 2021-04-07: 12.5 g via INTRAVENOUS
  Filled 2021-04-07: qty 50

## 2021-04-07 MED ORDER — QUETIAPINE FUMARATE 25 MG PO TABS
25.0000 mg | ORAL_TABLET | Freq: Every day | ORAL | Status: DC
  Administered 2021-04-08 – 2021-04-13 (×6): 25 mg via ORAL
  Filled 2021-04-07 (×6): qty 1

## 2021-04-07 MED ORDER — CARBIDOPA-LEVODOPA 25-100 MG PO TABS
3.0000 | ORAL_TABLET | Freq: Four times a day (QID) | ORAL | Status: DC
  Administered 2021-04-07 – 2021-04-13 (×22): 3 via ORAL
  Filled 2021-04-07 (×25): qty 3

## 2021-04-07 NOTE — Progress Notes (Signed)
PROGRESS NOTE    Kevin Bonilla  WUJ:[id number] DOB: Feb 10, 1951 DOA: 04/05/2021 PCP: Barbette Reichmann, MD    Brief Narrative:  Kevin Bonilla is a 70 y.o. male with medical history significant for Parkinson's disease, nonverbal at baseline, COPD, HTN, DM with no history of seizure disorder brought in by EMS for evaluation of seizure-like episode that occurred after a several hour history of copious diarrhea. Found with colitis on CT. Started on IV abx. GI consulted.   7/12-this am awake, moving in bed, but nonverbal. Spoke to wife who reports pt at baseline is nonverbal.   Consultants:  GI  Procedures:  CT of abdomen pelvis   IMPRESSION: Significant dilatation of the distal small bowel and majority of the colon related to colitis and likely subsequent dilatation of the small bowel with decreased transit time. No perforation is noted.  Distension of the stomach with air.  T8 compression deformity which is new from the prior exam but has a chronic appearance. No retropulsion is noted.  CT of the head IMPRESSION: 1. Left periorbital and forehead scalp soft tissue swelling without underlying acute fracture. 2. No acute intracranial abnormality. 3. Chronic bilateral lamina papyracea and left anterior frontal sinus fractures.     CT of cervical spine Mildly degraded by motion. No acute traumatic injury identified in the cervical spine.    Antimicrobials:  Metronidazole, cefepime   Subjective: Patient nonverbal, more awake than yesterday.  Moving around.  Objective: Vitals:   04/07/21 0054 04/07/21 0508 04/07/21 0751 04/07/21 1207  BP: (!) 146/67 123/62 137/61 (!) 116/55  Pulse: 93 83 81 66  Resp: 18 16    Temp: 98.1 F (36.7 C) 98.2 F (36.8 C) 99.6 F (37.6 C) 98.4 F (36.9 C)  TempSrc:    Oral  SpO2: 95% 94% 100% 96%  Weight:      Height:        Intake/Output Summary (Last 24 hours) at 04/07/2021 1307 Last data filed at 04/07/2021  9562 Gross per 24 hour  Intake 1131.88 ml  Output 950 ml  Net 181.88 ml   Filed Weights   04/05/21 2325  Weight: 82 kg    Examination:  General exam: Appears calm and comfortable, awake and alert today Respiratory system: Clear to auscultation. Respiratory effort normal. Cardiovascular system: S1 & S2 heard, RRR. No JVD,  gallops or clicks.  Gastrointestinal system: Abdomen is nondistended, soft and nontender. Normal bowel sounds heard. Central nervous system: Alert and alert but unable to do full neuro exam  Extremities: No edema Skin: Warm dry     Data Reviewed: I have personally reviewed following labs and imaging studies  CBC: Recent Labs  Lab 04/05/21 2336 04/06/21 0703  WBC 16.1* 10.4  NEUTROABS 14.4*  --   HGB 11.7* 11.8*  HCT 38.4* 36.5*  MCV 87.9 83.1  PLT 333 248   Basic Metabolic Panel: Recent Labs  Lab 04/05/21 2336 04/06/21 0703 04/07/21 0833  NA 138 139 139  K 5.5* 3.7 3.1*  CL 105 105 103  CO2 GLUCOSE 218* 115* 110*  BUN 31* 32* 25*  CREATININE 1.23 1.09 0.94  CALCIUM 8.3* 8.0* 8.5*  MG 2.2  --   --    GFR: Estimated Creatinine Clearance: 73.8 mL/min (by C-G formula based on SCr of 0.94 mg/dL). Liver Function Tests: Recent Labs  Lab 04/05/21 2336  AST 15  ALT <5  ALKPHOS 55  BILITOT 1.3*  PROT 5.0*  ALBUMIN 3.3*  No results for input(s): LIPASE, AMYLASE in the last 168 hours. No results for input(s): AMMONIA in the last 168 hours. Coagulation Profile: Recent Labs  Lab 04/06/21 0703  INR 1.1   Cardiac Enzymes: No results for input(s): CKTOTAL, CKMB, CKMBINDEX, TROPONINI in the last 168 hours. BNP (last 3 results) No results for input(s): PROBNP in the last 8760 hours. HbA1C: Recent Labs    04/06/21 0635  HGBA1C 5.1   CBG: Recent Labs  Lab 04/06/21 2357 04/07/21 0056 04/07/21 0752 04/07/21 0843 04/07/21 1209  GLUCAP 87 84 57* 92 93   Lipid Profile: No results for input(s): CHOL, HDL, LDLCALC, TRIG,  CHOLHDL, LDLDIRECT in the last 72 hours. Thyroid Function Tests: No results for input(s): TSH, T4TOTAL, FREET4, T3FREE, THYROIDAB in the last 72 hours. Anemia Panel: No results for input(s): VITAMINB12, FOLATE, FERRITIN, TIBC, IRON, RETICCTPCT in the last 72 hours. Sepsis Labs: Recent Labs  Lab 04/05/21 2328 04/06/21 0143 04/06/21 0635 04/06/21 0703 04/06/21 1044  PROCALCITON  --   --   --  1.90  --   LATICACIDVEN 2.8* 4.0* 1.6  --  2.7*    Recent Results (from the past 240 hour(s))  C Difficile Quick Screen w PCR reflex     Status: None   Collection Time: 04/05/21 11:32 PM   Specimen: STOOL  Result Value Ref Range Status   C Diff antigen NEGATIVE NEGATIVE Final   C Diff toxin NEGATIVE NEGATIVE Final   C Diff interpretation No C. difficile detected.  Final    Comment: Performed at [facility], [address] [place], [place] [address]  Resp Panel by RT-PCR (Flu A&B, Covid) Nasopharyngeal Swab     Status: None   Collection Time: 04/05/21 11:58 PM   Specimen: Nasopharyngeal Swab; Nasopharyngeal(NP) swabs in vial transport medium  Result Value Ref Range Status   SARS Coronavirus 2 by RT PCR NEGATIVE NEGATIVE Final    Comment: (NOTE) SARS-CoV-2 target nucleic acids are NOT DETECTED.  The SARS-CoV-2 RNA is generally detectable in upper respiratory specimens during the acute phase of infection. The lowest concentration of SARS-CoV-2 viral copies this assay can detect is 138 copies/mL. A negative result does not preclude SARS-Cov-2 infection and should not be used as the sole basis for treatment or other patient management decisions. A negative result may occur with  improper specimen collection/handling, submission of specimen other than nasopharyngeal swab, presence of viral mutation(s) within the areas targeted by this assay, and inadequate number of viral copies(<138 copies/mL). A negative result must be combined with clinical observations, patient history, and  epidemiological information. The expected result is Negative.  Fact Sheet for Patients:  BloggerCourse.com  Fact Sheet for Healthcare Providers:  SeriousBroker.it  This test is no t yet approved or cleared by the [place] FDA and  has been authorized for detection and/or diagnosis of SARS-CoV-2 by FDA under an Emergency Use Authorization (EUA). This EUA will remain  in effect (meaning this test can be used) for the duration of the COVID-19 declaration under Section 564(b)(1) of the Act, 21 U.S.C.section 360bbb-3(b)(1), unless the authorization is terminated  or revoked sooner.       Influenza A by PCR NEGATIVE NEGATIVE Final   Influenza B by PCR NEGATIVE NEGATIVE Final    Comment: (NOTE) The Xpert Xpress SARS-CoV-2/FLU/RSV plus assay is intended as an aid in the diagnosis of influenza from Nasopharyngeal swab specimens and should not be used as a sole basis for treatment. Nasal washings and aspirates are unacceptable  for Xpert Xpress SARS-CoV-2/FLU/RSV testing.  Fact Sheet for Patients: BloggerCourse.com  Fact Sheet for Healthcare Providers: SeriousBroker.it  This test is not yet approved or cleared by the [place] FDA and has been authorized for detection and/or diagnosis of SARS-CoV-2 by FDA under an Emergency Use Authorization (EUA). This EUA will remain in effect (meaning this test can be used) for the duration of the COVID-19 declaration under Section 564(b)(1) of the Act, 21 U.S.C. section 360bbb-3(b)(1), unless the authorization is terminated or revoked.  Performed at [facility], [address] [place], [place] [address]   Culture, blood (single)     Status: None (Preliminary result)   Collection Time: 04/06/21  1:05 AM   Specimen: BLOOD  Result Value Ref Range Status   Specimen Description BLOOD RIGHT ASSIST CONTROL  Final   Special  Requests   Final    BOTTLES DRAWN AEROBIC AND ANAEROBIC Blood Culture adequate volume   Culture   Final    NO GROWTH 1 DAY Performed at [facility], [address], [place], [place] [address]    Report Status PENDING  Incomplete  MRSA Next Gen by PCR, Nasal     Status: None   Collection Time: 04/06/21  4:02 AM   Specimen: Nasal Mucosa; Nasal Swab  Result Value Ref Range Status   MRSA by PCR Next Gen NOT DETECTED NOT DETECTED Final    Comment: (NOTE) The GeneXpert MRSA Assay (FDA approved for NASAL specimens only), is one component of a comprehensive MRSA colonization surveillance program. It is not intended to diagnose MRSA infection nor to guide or monitor treatment for MRSA infections. Test performance is not FDA approved in patients less than 13 years old. Performed at [facility], [address] [place], [place] [address]   Gastrointestinal Panel by PCR , Stool     Status: None   Collection Time: 04/06/21  3:00 PM   Specimen: Stool  Result Value Ref Range Status   Campylobacter species NOT DETECTED NOT DETECTED Final   Plesimonas shigelloides NOT DETECTED NOT DETECTED Final   Salmonella species NOT DETECTED NOT DETECTED Final   Yersinia enterocolitica NOT DETECTED NOT DETECTED Final   Vibrio species NOT DETECTED NOT DETECTED Final   Vibrio cholerae NOT DETECTED NOT DETECTED Final   Enteroaggregative E coli (EAEC) NOT DETECTED NOT DETECTED Final   Enteropathogenic E coli (EPEC) NOT DETECTED NOT DETECTED Final   Enterotoxigenic E coli (ETEC) NOT DETECTED NOT DETECTED Final   Shiga like toxin producing E coli (STEC) NOT DETECTED NOT DETECTED Final   Shigella/Enteroinvasive E coli (EIEC) NOT DETECTED NOT DETECTED Final   Cryptosporidium NOT DETECTED NOT DETECTED Final   Cyclospora cayetanensis NOT DETECTED NOT DETECTED Final   Entamoeba histolytica NOT DETECTED NOT DETECTED Final   Giardia lamblia NOT DETECTED NOT DETECTED Final   Adenovirus F40/41 NOT  DETECTED NOT DETECTED Final   Astrovirus NOT DETECTED NOT DETECTED Final   Norovirus GI/GII NOT DETECTED NOT DETECTED Final   Rotavirus A NOT DETECTED NOT DETECTED Final   Sapovirus (I, II, IV, and V) NOT DETECTED NOT DETECTED Final    Comment: Performed at [facility], [address] [place], [place] [address]         Radiology Studies: CT HEAD WO CONTRAST  Result Date: 04/07/2021 CLINICAL DATA:  70 year old male status post seizure like episode. Hypotensive. EXAM: CT HEAD WITHOUT CONTRAST TECHNIQUE: Contiguous axial images were obtained from the base of the skull through the vertex without  intravenous contrast. COMPARISON:  Head CT 12/04/2020. FINDINGS: Brain: Stable cerebral volume. 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 Patchy mostly frontal horn region white matter hypodensity is stable. No midline shift, ventriculomegaly, mass effect, evidence of mass lesion, intracranial hemorrhage or evidence of cortically based acute infarction. Vascular: Calcified atherosclerosis at the skull base. No suspicious intracranial vascular hyperdensity. Skull: Chronic bilateral lamina papyracea and left anterior frontal sinus fractures are stable since March. No acute osseous abnormality identified. Sinuses/Orbits: Visualized paranasal sinuses and mastoids are stable and well aerated. Other: Asymmetric left periorbital and forehead scalp soft tissue swelling. Intraorbital soft tissues appear to remain normal. IMPRESSION: 1. Left periorbital and forehead scalp soft tissue swelling without underlying acute fracture. 2. No acute intracranial abnormality. 3. Chronic bilateral lamina papyracea and left anterior frontal sinus fractures. Electronically Signed   By: Odessa Fleming M.D.   On: 04/07/2021 11:50   CT CERVICAL SPINE WO CONTRAST  Result Date: 04/07/2021 CLINICAL DATA:  70 year old male status post seizure like episode. Hypotensive. EXAM: CT CERVICAL SPINE WITHOUT CONTRAST  TECHNIQUE: Multidetector CT imaging of the cervical spine was performed without intravenous contrast. Multiplanar CT image reconstructions were also generated. COMPARISON:  Head CT today reported separately. Cervical spine CT 11/23/2020. FINDINGS: Study is intermittently degraded by motion artifact. Alignment: Stable since February, with degenerative appearing anterolisthesis of C4 on C5. Cervicothoracic junction alignment is within normal limits. Skull base and vertebrae: Visualized skull base is intact. No atlanto-occipital dissociation. C1 and C2 appear intact and aligned. No acute osseous abnormality identified. Soft tissues and spinal canal: No prevertebral fluid or swelling. No visible canal hematoma. Calcified cervical carotid atherosclerosis. Otherwise negative visible noncontrast neck soft tissues. Disc levels:  Stable chronic degeneration. Upper chest: Stable, negative. IMPRESSION: Mildly degraded by motion. No acute traumatic injury identified in the cervical spine. Electronically Signed   By: Odessa Fleming M.D.   On: 04/07/2021 11:54   CT ABDOMEN PELVIS W CONTRAST  Result Date: 04/06/2021 CLINICAL DATA:  Abdominal pain and diarrhea EXAM: CT ABDOMEN AND PELVIS WITH CONTRAST TECHNIQUE: Multidetector CT imaging of the abdomen and pelvis was performed using the standard protocol following bolus administration of intravenous contrast. CONTRAST:  OMNIPAQUE IOHEXOL 300 MG/ML  SOLN COMPARISON:  11/23/2020 FINDINGS: Lower chest: No acute abnormality. Hepatobiliary: Fatty infiltration of the liver is noted. Central cyst in the right lobe of the liver is noted. The gallbladder is well distended. Pancreas: Unremarkable. No pancreatic ductal dilatation or surrounding inflammatory changes. Spleen: Normal in size without focal abnormality. Adrenals/Urinary Tract: Adrenal glands are within normal limits. Kidneys demonstrate a normal enhancement pattern bilaterally. Cystic lesion is noted in the lower pole of the  right kidney stable from the prior exam. No obstructive changes are seen. The bladder is decompressed. Stomach/Bowel: Significant dilatation of the colon is noted with inflammatory changes of the colonic wall and mucosal hyperemia consistent with colitis. This is most notable in the rectosigmoid. The more proximal colon appears within normal limits. The appendix is not well visualized although no inflammatory changes to suggest appendicitis are seen. Fluid-filled loops of distal small bowel are noted consistent with a degree of enteritis. No definitive obstructive changes are seen. Proximal small bowel is decompressed. The stomach is significantly distended with air. Vascular/Lymphatic: Aortic atherosclerosis. No enlarged abdominal or pelvic lymph nodes. Reproductive: Prostate is unremarkable. Other: Fluid is noted within the right inguinal canal new from the prior exam. Mild free pelvic fluid is noted as well. This is likely reactive in nature  Musculoskeletal: Degenerative changes of the lumbar spine are seen. Postsurgical changes in the proximal left femur are noted. T8 compression deformity is noted. This appears chronic in nature but is new from the prior exam. IMPRESSION: Significant dilatation of the distal small bowel and majority of the colon related to colitis and likely subsequent dilatation of the small bowel with decreased transit time. No perforation is noted. Distension of the stomach with air. T8 compression deformity which is new from the prior exam but has a chronic appearance. No retropulsion is noted. Electronically Signed   By: Alcide Clever M.D.   On: 04/06/2021 01:15        Scheduled Meds:  enoxaparin (LOVENOX) injection  40 mg Subcutaneous Q24H   insulin aspart  0-5 Units Subcutaneous QHS   insulin aspart  0-9 Units Subcutaneous TID WC   Continuous Infusions:  ceFEPime (MAXIPIME) IV 2 g (04/07/21 0501)   metronidazole 500 mg (04/07/21 1210)    Assessment & Plan:   Principal  Problem:   Severe sepsis (HCC) Active Problems:   Essential (primary) hypertension   Seizure-like activity (HCC)   Diabetes mellitus type 2, uncomplicated (HCC)   Parkinson's disease (HCC)   Acute colitis   Compression fracture of T8 vertebra (HCC)   Hypotension   COPD (chronic obstructive pulmonary disease) (HCC)   70 year old male with history of Parkinson's disease, nonverbal at baseline, COPD, HTN, DM with no history of seizure disorder brought in by EMS for evaluation of seizure-like episode that occurred after a several hour history of copious diarrhea.      Severe sepsis (HCC)   Acute colitis Sepsis criteria due to acute colitis Continue IV antibiotics Check lactic acid as it is elevated Continue IV fluids Continue cefepime and Flagyl Stool studies and C. difficile negative GI consulted and input was appreciated.  Plan was to do sigmoidoscopy if he is not improving but today he clinically looks better. Will need to continue antibiotics for 5 days     Hypotension, with history of hypertension - Secondary to hypovolemia from copious diarrhea as well as sepsis 7/12 continue to hold antihypertensive Continue IV fluids   Hypokalemia-due to diarrhea Will replace     Seizure-like activity (HCC) - Patient was described as shaking - Acute seizure not suspected but will continue to monitor with neurochecks - Fall and aspiration precautions Neurology was consulted, via chat Dr. Wilford Corner did not feel this was seizure after reviewing pts chart. No further behavior noted since admission to suggest seizure.     Diabetes mellitus type 2, uncomplicated (HCC) - Sliding scale insulin coverage     Parkinson's disease (HCC) - Continue home Sinemet     Compression fracture of T8 vertebra (HCC) - New finding but appears chronic.  Seen on CT     COPD (chronic obstructive pulmonary disease) (HCC) - DuoNebs as needed   DVT prophylaxis Lovenox Code Status: Full Family  Communication: Updated wife Disposition Plan:  Status is: Inpatient  Remains inpatient appropriate because:Inpatient level of care appropriate due to severity of illness  Dispo: The patient is from: SNF              Anticipated d/c is to: SNF              Patient currently is not medically stable to d/c.   Difficult to place patient No            LOS: 1 day   Time spent: 35 minutes with more than 50% on  COC    Lynn Ito, MD [facility] Pager [phone]  If 7PM-7AM, please contact night-coverage 04/07/2021, 1:07 PM

## 2021-04-07 NOTE — Progress Notes (Signed)
Wyline Mood , MD [address], [address], [place], [place], [address] [address], [place], [place], [address] Phone: [phone]  Fax: [phone]   Kevin Bonilla is being followed for acute colitis  Day 1 of follow up   Subjective: No complaints    Objective: Vital signs in last 24 hours: Vitals:   04/06/21 2010 04/07/21 0054 04/07/21 0508 04/07/21 0751  BP: 103/68 (!) 146/67 123/62 137/61  Pulse: 74 93 83 81  Resp: 18 18 16    Temp: 98 F (36.7 C) 98.1 F (36.7 C) 98.2 F (36.8 C) 99.6 F (37.6 C)  TempSrc:      SpO2: 100% 95% 94% 100%  Weight:      Height:       Weight change:   Intake/Output Summary (Last 24 hours) at 04/07/2021 0349 Last data filed at 04/07/2021 1791 Gross per 24 hour  Intake 1710.18 ml  Output 950 ml  Net 760.18 ml     Exam:  Abdomen: soft, nontender, normal bowel sounds   Lab Results: @LABTEST2 @ Micro Results: Recent Results (from the past 240 hour(s))  C Difficile Quick Screen w PCR reflex     Status: None   Collection Time: 04/05/21 11:32 PM   Specimen: STOOL  Result Value Ref Range Status   C Diff antigen NEGATIVE NEGATIVE Final   C Diff toxin NEGATIVE NEGATIVE Final   C Diff interpretation No C. difficile detected.  Final    Comment: Performed at [facility], [address] [place], [place] [address]  Resp Panel by RT-PCR (Flu A&B, Covid) Nasopharyngeal Swab     Status: None   Collection Time: 04/05/21 11:58 PM   Specimen: Nasopharyngeal Swab; Nasopharyngeal(NP) swabs in vial transport medium  Result Value Ref Range Status   SARS Coronavirus 2 by RT PCR NEGATIVE NEGATIVE Final    Comment: (NOTE) SARS-CoV-2 target nucleic acids are NOT DETECTED.  The SARS-CoV-2 RNA is generally detectable in upper respiratory specimens during the acute phase of infection. The lowest concentration of SARS-CoV-2 viral copies this assay can detect is 138 copies/mL. A negative result does not preclude  SARS-Cov-2 infection and should not be used as the sole basis for treatment or other patient management decisions. A negative result may occur with  improper specimen collection/handling, submission of specimen other than nasopharyngeal swab, presence of viral mutation(s) within the areas targeted by this assay, and inadequate number of viral copies(<138 copies/mL). A negative result must be combined with clinical observations, patient history, and epidemiological information. The expected result is Negative.  Fact Sheet for Patients:  BloggerCourse.com  Fact Sheet for Healthcare Providers:  SeriousBroker.it  This test is no t yet approved or cleared by the [place] FDA and  has been authorized for detection and/or diagnosis of SARS-CoV-2 by FDA under an Emergency Use Authorization (EUA). This EUA will remain  in effect (meaning this test can be used) for the duration of the COVID-19 declaration under Section 564(b)(1) of the Act, 21 U.S.C.section 360bbb-3(b)(1), unless the authorization is terminated  or revoked sooner.       Influenza A by PCR NEGATIVE NEGATIVE Final   Influenza B by PCR NEGATIVE NEGATIVE Final    Comment: (NOTE) The Xpert Xpress SARS-CoV-2/FLU/RSV plus assay is intended as an aid in the diagnosis of influenza from Nasopharyngeal swab specimens and should not be used as a sole basis for treatment. Nasal washings and aspirates are unacceptable for Xpert Xpress SARS-CoV-2/FLU/RSV testing.  Fact Sheet for  Patients: BloggerCourse.com  Fact Sheet for Healthcare Providers: SeriousBroker.it  This test is not yet approved or cleared by the [place] FDA and has been authorized for detection and/or diagnosis of SARS-CoV-2 by FDA under an Emergency Use Authorization (EUA). This EUA will remain in effect (meaning this test can be used) for the duration of  the COVID-19 declaration under Section 564(b)(1) of the Act, 21 U.S.C. section 360bbb-3(b)(1), unless the authorization is terminated or revoked.  Performed at [facility], [address] [place], [place] [address]   Culture, blood (single)     Status: None (Preliminary result)   Collection Time: 04/06/21  1:05 AM   Specimen: BLOOD  Result Value Ref Range Status   Specimen Description BLOOD RIGHT ASSIST CONTROL  Final   Special Requests   Final    BOTTLES DRAWN AEROBIC AND ANAEROBIC Blood Culture adequate volume   Culture   Final    NO GROWTH 1 DAY Performed at [facility], [address] [place], [place] [address]    Report Status PENDING  Incomplete  MRSA Next Gen by PCR, Nasal     Status: None   Collection Time: 04/06/21  4:02 AM   Specimen: Nasal Mucosa; Nasal Swab  Result Value Ref Range Status   MRSA by PCR Next Gen NOT DETECTED NOT DETECTED Final    Comment: (NOTE) The GeneXpert MRSA Assay (FDA approved for NASAL specimens only), is one component of a comprehensive MRSA colonization surveillance program. It is not intended to diagnose MRSA infection nor to guide or monitor treatment for MRSA infections. Test performance is not FDA approved in patients less than 56 years old. Performed at [facility], [address] [place], [place] [address]   Gastrointestinal Panel by PCR , Stool     Status: None   Collection Time: 04/06/21  3:00 PM   Specimen: Stool  Result Value Ref Range Status   Campylobacter species NOT DETECTED NOT DETECTED Final   Plesimonas shigelloides NOT DETECTED NOT DETECTED Final   Salmonella species NOT DETECTED NOT DETECTED Final   Yersinia enterocolitica NOT DETECTED NOT DETECTED Final   Vibrio species NOT DETECTED NOT DETECTED Final   Vibrio cholerae NOT DETECTED NOT DETECTED Final   Enteroaggregative E coli (EAEC) NOT DETECTED NOT DETECTED Final   Enteropathogenic E coli (EPEC) NOT DETECTED NOT DETECTED Final    Enterotoxigenic E coli (ETEC) NOT DETECTED NOT DETECTED Final   Shiga like toxin producing E coli (STEC) NOT DETECTED NOT DETECTED Final   Shigella/Enteroinvasive E coli (EIEC) NOT DETECTED NOT DETECTED Final   Cryptosporidium NOT DETECTED NOT DETECTED Final   Cyclospora cayetanensis NOT DETECTED NOT DETECTED Final   Entamoeba histolytica NOT DETECTED NOT DETECTED Final   Giardia lamblia NOT DETECTED NOT DETECTED Final   Adenovirus F40/41 NOT DETECTED NOT DETECTED Final   Astrovirus NOT DETECTED NOT DETECTED Final   Norovirus GI/GII NOT DETECTED NOT DETECTED Final   Rotavirus A NOT DETECTED NOT DETECTED Final   Sapovirus (I, II, IV, and V) NOT DETECTED NOT DETECTED Final    Comment: Performed at [facility], [address] [place], [place] [address]   Studies/Results: CT ABDOMEN PELVIS W CONTRAST  Result Date: 04/06/2021 CLINICAL DATA:  Abdominal pain and diarrhea EXAM: CT ABDOMEN AND PELVIS WITH CONTRAST TECHNIQUE: Multidetector CT imaging of the abdomen and pelvis was performed using the standard protocol following bolus administration of intravenous contrast. CONTRAST:  OMNIPAQUE IOHEXOL 300 MG/ML  SOLN COMPARISON:  11/23/2020  FINDINGS: Lower chest: No acute abnormality. Hepatobiliary: Fatty infiltration of the liver is noted. Central cyst in the right lobe of the liver is noted. The gallbladder is well distended. Pancreas: Unremarkable. No pancreatic ductal dilatation or surrounding inflammatory changes. Spleen: Normal in size without focal abnormality. Adrenals/Urinary Tract: Adrenal glands are within normal limits. Kidneys demonstrate a normal enhancement pattern bilaterally. Cystic lesion is noted in the lower pole of the right kidney stable from the prior exam. No obstructive changes are seen. The bladder is decompressed. Stomach/Bowel: Significant dilatation of the colon is noted with inflammatory changes of the colonic wall and mucosal hyperemia consistent with  colitis. This is most notable in the rectosigmoid. The more proximal colon appears within normal limits. The appendix is not well visualized although no inflammatory changes to suggest appendicitis are seen. Fluid-filled loops of distal small bowel are noted consistent with a degree of enteritis. No definitive obstructive changes are seen. Proximal small bowel is decompressed. The stomach is significantly distended with air. Vascular/Lymphatic: Aortic atherosclerosis. No enlarged abdominal or pelvic lymph nodes. Reproductive: Prostate is unremarkable. Other: Fluid is noted within the right inguinal canal new from the prior exam. Mild free pelvic fluid is noted as well. This is likely reactive in nature Musculoskeletal: Degenerative changes of the lumbar spine are seen. Postsurgical changes in the proximal left femur are noted. T8 compression deformity is noted. This appears chronic in nature but is new from the prior exam. IMPRESSION: Significant dilatation of the distal small bowel and majority of the colon related to colitis and likely subsequent dilatation of the small bowel with decreased transit time. No perforation is noted. Distension of the stomach with air. T8 compression deformity which is new from the prior exam but has a chronic appearance. No retropulsion is noted. Electronically Signed   By: Alcide Clever M.D.   On: 04/06/2021 01:15   Medications: I have reviewed the patient's current medications. Scheduled Meds:  enoxaparin (LOVENOX) injection  40 mg Subcutaneous Q24H   insulin aspart  0-5 Units Subcutaneous QHS   insulin aspart  0-9 Units Subcutaneous TID WC   Continuous Infusions:  ceFEPime (MAXIPIME) IV 2 g (04/07/21 0501)   metronidazole 500 mg (04/07/21 0131)   PRN Meds:.acetaminophen **OR** acetaminophen, ondansetron **OR** ondansetron (ZOFRAN) IV   Assessment: Principal Problem:   Severe sepsis (HCC) Active Problems:   Essential (primary) hypertension   Seizure-like activity  (HCC)   Diabetes mellitus type 2, uncomplicated (HCC)   Parkinson's disease (HCC)   Acute colitis   Compression fracture of T8 vertebra (HCC)   Hypotension   COPD (chronic obstructive pulmonary disease) (HCC)  Kevin Bonilla is a 70 y.o. y/o male who resides at SNF who is nonverbal presented to the ER with short history of severe diarrhea complicated with seizure-like activity.  Features of colitis seen on CT scan with small bowel dilation but no obstruction.  He was hypotensive in the ER and given fluids.  Differential diagnosis suggest a high likelihood of acute infectious colitis.  Since patient is elderly and immunocompromised agree with commencement of antibiotics.When I went into the room he was being changed and had brown old liquid stool in his pad , no bleeding . GI PCR testing negative.   Plan 1.  Continue antibiotics for 3-5 days .  2.   Abdomen is soft and non tender, he appears much better today than yesterday .      LOS: 1 day   Wyline Mood, MD 04/07/2021, 9:16 AM

## 2021-04-07 NOTE — Progress Notes (Signed)
   04/07/21 0040  Precautions / Armbands  Precautions Fall risk  Patient armbands applied: Patient Identification (White);DNR (Purple);Fall risk (Yellow)  Risk for Aspiration  Aspiration Assessment - High Risk None of the above  Aspiration Assessment - At Risk Poor Dentition  Aspiration Risk Interventions At risk interventions implemented  Adult Fall Risk Assessment  Risk Factor Category (scoring not indicated) Fall has occurred during this admission (document High fall risk)  Patient Fall Risk Level High fall risk  Adult Fall Risk Interventions  Required Bundle Interventions *See Row Information* High fall risk - low, moderate, and high requirements implemented  Screening for Fall Injury Risk (To be completed on HIGH fall risk patients) - Assessing Need for Floor Mats  Risk For Fall Injury- Criteria for Floor Mats Confusion/dementia (+NuDESC, CIWA, TBI, etc.);Noncompliant with safety precautions  Will Implement Floor Mats Yes  Safe Patient Handling Assessment  Ambulates independently No  Pt pulls to standing position & wt <120kg (265lbs) No  Safe Patient Handling - Sara/Sabina Lift Assessment  The patient is less than 190kg (420lbs) in weight  Safe Patient Handling - Maximove / Viking Total Lift Assessment  Can tolerate a semi reclined position Yes  Weighs less than 227kg (500lbs) Yes  Safe Patient Handling Equipment Maximove / Viking Total Lift recommended  Safety Interventions  Less Restrictive Interventions Active listening;Bed alarm;Decrease stimulation (calming techniques);Frequent verbal contacts;Observation;Pain/Anxiety management;Patient safety mitt;Place patient near nursing station;Provide reassurance;Re-evaluate equipment (appropriately applied);Reorient patient;Repositioning  Violence Assessment Tool  History of Violence 0  Confused 1  Irritable 0  Boisterous 0  Verbal Threats 0  Physical Threats 0  Attacking Objects 0  Agitate/Impulsive 0  Paranoid/Suspicious 0   Intoxication/Withdrawal 0  Socially Inappropriate/Disruptive Behavior 0  Body Language 0  VAT Score 1  VAT Category Moderate Risk  VAT Interventions Moderate Risk (Score 1-3) Interventions Implemented  Additional Interventions Assess need for medication  Safe Environment  Patient oriented to unit and equipment Yes

## 2021-04-07 NOTE — Progress Notes (Signed)
   04/07/21 0040  What Happened  Was fall witnessed? No  Was patient injured? Yes (knot on left forehead)  Patient found on floor  Found by Staff-comment (tech)  Stated prior activity other (comment) (In bed moving a lot)  Follow Up  MD notified Andris Baumann, MD  Time MD notified [phone]  Family notified Yes - comment  Time family notified 0120  Additional tests Yes-comment  Progress note created (see row info) Yes  Adult Fall Risk Assessment  Risk Factor Category (scoring not indicated) Fall has occurred during this admission (document High fall risk)  Patient Fall Risk Level High fall risk  Adult Fall Risk Interventions  Required Bundle Interventions *See Row Information* High fall risk - low, moderate, and high requirements implemented  Screening for Fall Injury Risk (To be completed on HIGH fall risk patients) - Assessing Need for Floor Mats  Risk For Fall Injury- Criteria for Floor Mats Confusion/dementia (+NuDESC, CIWA, TBI, etc.);Noncompliant with safety precautions  Will Implement Floor Mats Yes  Pain Assessment  Pain Scale Faces  Faces Pain Scale 0  Neurological  Neuro (WDL) X  Level of Consciousness Alert  Orientation Level  (nonverbal at baseline; nods yes or no to questions)  Cognition Poor attention/concentration;Poor judgement;Poor safety awareness  Speech Nods/gestures appropriately  Pupil Assessment  Yes  R Pupil Size (mm) 2  R Pupil Shape Round  R Pupil Reaction Brisk  L Pupil Size (mm) 2  L Pupil Shape Round  L Pupil Reaction Brisk  Motor Function/Sensation Assessment Head  Facial Symmetry Symmetrical  R Elbow Extension (Push/Biceps) Strong  L Elbow Extension (Push/Biceps) Strong  R Elbow Flexion (Pull/Triceps) Strong  L Elbow Flexion (Pull/Triceps) Strong  R Foot Plantar Flexion Strong  L Foot Plantar Flexion Strong  RUE Motor Response Purposeful movement;Responds to commands;No tremor  RUE Sensation Full sensation  RUE Motor Strength 5  LUE  Motor Response Purposeful movement;No tremor;Responds to commands  LUE Sensation Full sensation  LUE Motor Strength 5  RLE Motor Response Purposeful movement;No tremor;Responds to commands  RLE Sensation Full sensation  RLE Motor Strength 5  LLE Motor Response Purposeful movement;No tremor;Responds to commands  LLE Sensation Full sensation  LLE Motor Strength 5  Neuro Symptoms None  Musculoskeletal  Musculoskeletal (WDL) X  Assistive Device None  Generalized Weakness Yes  Weight Bearing Restrictions No  Integumentary  Integumentary (WDL) X  Skin Color Appropriate for ethnicity  Skin Condition Dry  Skin Integrity Other (Comment) (knot on left forehead area)  Skin Turgor Non-tenting

## 2021-04-08 DIAGNOSIS — G20 Parkinson's disease: Secondary | ICD-10-CM

## 2021-04-08 LAB — GLUCOSE, CAPILLARY
Glucose-Capillary: 67 mg/dL — ABNORMAL LOW (ref 70–99)
Glucose-Capillary: 71 mg/dL (ref 70–99)
Glucose-Capillary: 98 mg/dL (ref 70–99)
Glucose-Capillary: 125 mg/dL — ABNORMAL HIGH (ref 70–99)
Glucose-Capillary: 86 mg/dL (ref 70–99)

## 2021-04-08 LAB — BASIC METABOLIC PANEL
Anion gap: 9 (ref 5–15)
BUN: 19 mg/dL (ref 8–23)
CO2: 28 mmol/L (ref 22–32)
Calcium: 8.6 mg/dL — ABNORMAL LOW (ref 8.9–10.3)
Chloride: 102 mmol/L (ref 98–111)
Creatinine, Ser: 0.94 mg/dL (ref 0.61–1.24)
GFR, Estimated: >60 mL/min (ref >60)
Glucose, Bld: 89 mg/dL (ref 70–99)
Potassium: 2.8 mmol/L — ABNORMAL LOW (ref 3.5–5.1)
Sodium: 139 mmol/L (ref 135–145)

## 2021-04-08 MED ORDER — ALBUTEROL SULFATE (2.5 MG/3ML) 0.083% IN NEBU: 2.5000 mg | INHALATION_SOLUTION | Freq: Four times a day (QID) | RESPIRATORY_TRACT | Status: DC | PRN

## 2021-04-08 MED ORDER — POTASSIUM CHLORIDE CRYS ER 20 MEQ PO TBCR: 40.0000 meq | EXTENDED_RELEASE_TABLET | Freq: Two times a day (BID) | ORAL | Status: DC

## 2021-04-08 MED ORDER — HALOPERIDOL LACTATE 5 MG/ML IJ SOLN
1.0000 mg | Freq: Once | INTRAMUSCULAR | Status: AC
  Administered 2021-04-08: 1 mg via INTRAVENOUS
  Filled 2021-04-08: qty 1

## 2021-04-08 MED ORDER — TAMSULOSIN HCL 0.4 MG PO CAPS
0.4000 mg | ORAL_CAPSULE | Freq: Every day | ORAL | Status: DC
  Administered 2021-04-08 – 2021-04-13 (×6): 0.4 mg via ORAL
  Filled 2021-04-08 (×6): qty 1

## 2021-04-08 MED ORDER — DULOXETINE HCL 30 MG PO CPEP
60.0000 mg | ORAL_CAPSULE | Freq: Every day | ORAL | Status: DC
  Administered 2021-04-08 – 2021-04-12 (×5): 60 mg via ORAL
  Filled 2021-04-08 (×5): qty 2

## 2021-04-08 MED ORDER — POTASSIUM CHLORIDE CRYS ER 20 MEQ PO TBCR
40.0000 meq | EXTENDED_RELEASE_TABLET | Freq: Two times a day (BID) | ORAL | Status: AC
  Administered 2021-04-08 (×2): 40 meq via ORAL
  Filled 2021-04-08 (×2): qty 2

## 2021-04-08 NOTE — Progress Notes (Signed)
Pt advanced to Dysphagia ll diet. Tolerated well. Requires assistance with feeding. Will continue to monitor.
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
## 2021-04-08 NOTE — Progress Notes (Addendum)
PROGRESS NOTE    Kevin Bonilla  SFK:[id number] DOB: 1950-12-19 DOA: 04/05/2021 PCP: Tracie Harrier, MD   Assessment & Plan:   Principal Problem:   Severe sepsis ([place]) Active Problems:   Essential (primary) hypertension   Seizure-like activity ([place])   Diabetes mellitus type 2, uncomplicated ([place])   Parkinson's disease ([place])   Acute colitis   Compression fracture of T8 vertebra ([place])   Hypotension   COPD (chronic obstructive pulmonary disease) (HCC)   Severe sepsis: met criteria w/ leukocytosis, elevated lactic acid, hypotension & acute colitis. GI PCR panel was neg. Continue on IV flagyl, cefepime. Continue on IVFs. Continue on abxs for 3-5 days as per GI.   Acute colitis: continue on IV flagyl, cefepime. GI following and recs apprec  Hypotension: resolved   Hypokalemia: secondary to diarrhea. KCl repleated   Unlikely seizure-like activity: neurology was consulted, via chat Dr. Rory Percy did not feel this was seizure after reviewing pts chart as per admitting physician, Dr. Damita Dunnings   DM2: well controlled w/ HbA1c 5.1 on 04/06/21. Continue on SSI w/ accuchecks   Parkinson's disease: continue on home dose of carbidopa-levodopa   Compression fracture of T8 vertebra : new finding but appears chronic as per CT scan    COPD: w/o exacerbation. Continue on bronchodilators    DVT prophylaxis: lovenox  Code Status: full  Family Communication:  Disposition Plan:  depends on PT/OT recs (not consulted yet)   Level of care: Progressive Cardiac  Status is: Inpatient  Remains inpatient appropriate because:IV treatments appropriate due to intensity of illness or inability to take PO and Inpatient level of care appropriate due to severity of illness  Dispo: The patient is from: Home              Anticipated d/c is to: Home vs SNF              Patient currently is not medically stable to d/c.   Difficult to place patient : unclear        Consultants:   GI  Procedures:   Antimicrobials: cefepime, flagyl   Subjective: Pt is lethargic. Pt denies any pain   Objective: Vitals:   04/07/21 1620 04/07/21 2209 04/08/21 0500 04/08/21 0733  BP: (!) 150/61 116/90 127/69 (!) 123/55  Pulse: 85 97 69 63  Resp:  _0 Temp: 98.3 F (36.8 C) 98.3 F (36.8 C)  97.7 F (36.5 C)  TempSrc: Oral Oral    SpO2: 97% 100% 100% 98%  Weight:      Height:        Intake/Output Summary (Last 24 hours) at 04/08/2021 0859 Last data filed at 04/08/2021 0855 Gross per 24 hour  Intake --  Output 1250 ml  Net -1250 ml   Filed Weights   04/05/21 2325  Weight: 82 kg    Examination:  General exam: Appears lethargic  Respiratory system: Clear to auscultation. Respiratory effort normal. Cardiovascular system: S1 & S2+. Systolic murmur. No  rubs, gallops or clicks. Gastrointestinal system: Abdomen is nondistended, soft and nontender. Normal bowel sounds heard. Central nervous system: Lethargic. Moves all extremities  Psychiatry: Judgement and insight appear abnormal. Flat mood and affect    Data Reviewed: I have personally reviewed following labs and imaging studies  CBC: Recent Labs  Lab 04/05/21 2336 04/06/21 0703  WBC 16.1* 10.4  NEUTROABS 14.4*  --   HGB 11.7* 11.8*  HCT 38.4* 36.5*  MCV 87.9 83.1  PLT 333 174   Basic Metabolic  Panel: Recent Labs  Lab 04/05/21 2336 04/06/21 0703 04/07/21 0833 04/08/21 0652  NA 138 139 139 139  K 5.5* 3.7 3.1* 2.8*  CL 105 105 103 102  CO2 _0 GLUCOSE 218* 115* 110* 89  BUN 31* 32* 25* 19  CREATININE 1.23 1.09 0.94 0.94  CALCIUM 8.3* 8.0* 8.5* 8.6*  MG 2.2  --   --   --    GFR: Estimated Creatinine Clearance: 73.8 mL/min (by C-G formula based on SCr of 0.94 mg/dL). Liver Function Tests: Recent Labs  Lab 04/05/21 2336  AST 15  ALT <5  ALKPHOS 55  BILITOT 1.3*  PROT 5.0*  ALBUMIN 3.3*   No results for input(s): LIPASE, AMYLASE in the last 168 hours. No results for  input(s): AMMONIA in the last 168 hours. Coagulation Profile: Recent Labs  Lab 04/06/21 0703  INR 1.1   Cardiac Enzymes: No results for input(s): CKTOTAL, CKMB, CKMBINDEX, TROPONINI in the last 168 hours. BNP (last 3 results) No results for input(s): PROBNP in the last 8760 hours. HbA1C: Recent Labs    04/06/21 0635  HGBA1C 5.1   CBG: Recent Labs  Lab 04/07/21 1621 04/07/21 2016 04/07/21 2336 04/08/21 0731 04/08/21 0801  GLUCAP 90 94 101* 67* 71   Lipid Profile: No results for input(s): CHOL, HDL, LDLCALC, TRIG, CHOLHDL, LDLDIRECT in the last 72 hours. Thyroid Function Tests: No results for input(s): TSH, T4TOTAL, FREET4, T3FREE, THYROIDAB in the last 72 hours. Anemia Panel: No results for input(s): VITAMINB12, FOLATE, FERRITIN, TIBC, IRON, RETICCTPCT in the last 72 hours. Sepsis Labs: Recent Labs  Lab 04/06/21 0143 04/06/21 0635 04/06/21 0703 04/06/21 1044 04/07/21 1643  PROCALCITON  --   --  1.90  --   --   LATICACIDVEN 4.0* 1.6  --  2.7* 0.9    Recent Results (from the past 240 hour(s))  C Difficile Quick Screen w PCR reflex     Status: None   Collection Time: 04/05/21 11:32 PM   Specimen: STOOL  Result Value Ref Range Status   C Diff antigen NEGATIVE NEGATIVE Final   C Diff toxin NEGATIVE NEGATIVE Final   C Diff interpretation No C. difficile detected.  Final    Comment: Performed at [facility], [address] [place], [place] [address]  Resp Panel by RT-PCR (Flu A&B, Covid) Nasopharyngeal Swab     Status: None   Collection Time: 04/05/21 11:58 PM   Specimen: Nasopharyngeal Swab; Nasopharyngeal(NP) swabs in vial transport medium  Result Value Ref Range Status   SARS Coronavirus 2 by RT PCR NEGATIVE NEGATIVE Final    Comment: (NOTE) SARS-CoV-2 target nucleic acids are NOT DETECTED.  The SARS-CoV-2 RNA is generally detectable in upper respiratory specimens during the acute phase of infection. The lowest concentration of SARS-CoV-2 viral  copies this assay can detect is 138 copies/mL. A negative result does not preclude SARS-Cov-2 infection and should not be used as the sole basis for treatment or other patient management decisions. A negative result may occur with  improper specimen collection/handling, submission of specimen other than nasopharyngeal swab, presence of viral mutation(s) within the areas targeted by this assay, and inadequate number of viral copies(<138 copies/mL). A negative result must be combined with clinical observations, patient history, and epidemiological information. The expected result is Negative.  Fact Sheet for Patients:  EntrepreneurPulse.com.au  Fact Sheet for Healthcare Providers:  IncredibleEmployment.be  This test is no t yet approved or cleared by the [place] and  has been authorized for detection and/or diagnosis of SARS-CoV-2 by FDA under an Emergency Use Authorization (EUA). This EUA will remain  in effect (meaning this test can be used) for the duration of the COVID-19 declaration under Section 564(b)(1) of the Act, 21 U.S.C.section 360bbb-3(b)(1), unless the authorization is terminated  or revoked sooner.       Influenza A by PCR NEGATIVE NEGATIVE Final   Influenza B by PCR NEGATIVE NEGATIVE Final    Comment: (NOTE) The Xpert Xpress SARS-CoV-2/FLU/RSV plus assay is intended as an aid in the diagnosis of influenza from Nasopharyngeal swab specimens and should not be used as a sole basis for treatment. Nasal washings and aspirates are unacceptable for Xpert Xpress SARS-CoV-2/FLU/RSV testing.  Fact Sheet for Patients: EntrepreneurPulse.com.au  Fact Sheet for Healthcare Providers: IncredibleEmployment.be  This test is not yet approved or cleared by the [place] FDA and has been authorized for detection and/or diagnosis of SARS-CoV-2 by FDA under an Emergency Use Authorization (EUA). This  EUA will remain in effect (meaning this test can be used) for the duration of the COVID-19 declaration under Section 564(b)(1) of the Act, 21 U.S.C. section 360bbb-3(b)(1), unless the authorization is terminated or revoked.  Performed at [facility], [address] [place], [place] [address]   Culture, blood (single)     Status: None (Preliminary result)   Collection Time: 04/06/21  1:05 AM   Specimen: BLOOD  Result Value Ref Range Status   Specimen Description BLOOD RIGHT ASSIST CONTROL  Final   Special Requests   Final    BOTTLES DRAWN AEROBIC AND ANAEROBIC Blood Culture adequate volume   Culture   Final    NO GROWTH 2 DAYS Performed at [facility], [address] [place], [place] [address]    Report Status PENDING  Incomplete  MRSA Next Gen by PCR, Nasal     Status: None   Collection Time: 04/06/21  4:02 AM   Specimen: Nasal Mucosa; Nasal Swab  Result Value Ref Range Status   MRSA by PCR Next Gen NOT DETECTED NOT DETECTED Final    Comment: (NOTE) The GeneXpert MRSA Assay (FDA approved for NASAL specimens only), is one component of a comprehensive MRSA colonization surveillance program. It is not intended to diagnose MRSA infection nor to guide or monitor treatment for MRSA infections. Test performance is not FDA approved in patients less than 26 years old. Performed at [facility], [address] [place], [place] [address]   Gastrointestinal Panel by PCR , Stool     Status: None   Collection Time: 04/06/21  3:00 PM   Specimen: Stool  Result Value Ref Range Status   Campylobacter species NOT DETECTED NOT DETECTED Final   Plesimonas shigelloides NOT DETECTED NOT DETECTED Final   Salmonella species NOT DETECTED NOT DETECTED Final   Yersinia enterocolitica NOT DETECTED NOT DETECTED Final   Vibrio species NOT DETECTED NOT DETECTED Final   Vibrio cholerae NOT DETECTED NOT DETECTED Final   Enteroaggregative E coli (EAEC) NOT DETECTED NOT  DETECTED Final   Enteropathogenic E coli (EPEC) NOT DETECTED NOT DETECTED Final   Enterotoxigenic E coli (ETEC) NOT DETECTED NOT DETECTED Final   Shiga like toxin producing E coli (STEC) NOT DETECTED NOT DETECTED Final   Shigella/Enteroinvasive E coli (EIEC) NOT DETECTED NOT DETECTED Final   Cryptosporidium NOT DETECTED NOT DETECTED Final   Cyclospora cayetanensis NOT DETECTED NOT DETECTED Final   Entamoeba histolytica NOT DETECTED NOT DETECTED Final   Giardia lamblia  NOT DETECTED NOT DETECTED Final   Adenovirus F40/41 NOT DETECTED NOT DETECTED Final   Astrovirus NOT DETECTED NOT DETECTED Final   Norovirus GI/GII NOT DETECTED NOT DETECTED Final   Rotavirus A NOT DETECTED NOT DETECTED Final   Sapovirus (I, II, IV, and V) NOT DETECTED NOT DETECTED Final    Comment: Performed at [facility], [address], [place],  [address]         Radiology Studies: CT HEAD WO CONTRAST  Result Date: 04/07/2021 CLINICAL DATA:  70 year old male status post seizure like episode. Hypotensive. EXAM: CT HEAD WITHOUT CONTRAST TECHNIQUE: Contiguous axial images were obtained from the base of the skull through the vertex without intravenous contrast. COMPARISON:  Head CT 12/04/2020. FINDINGS: Brain: Stable cerebral volume. 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 Patchy mostly frontal horn region white matter hypodensity is stable. No midline shift, ventriculomegaly, mass effect, evidence of mass lesion, intracranial hemorrhage or evidence of cortically based acute infarction. Vascular: Calcified atherosclerosis at the skull base. No suspicious intracranial vascular hyperdensity. Skull: Chronic bilateral lamina papyracea and left anterior frontal sinus fractures are stable since March. No acute osseous abnormality identified. Sinuses/Orbits: Visualized paranasal sinuses and mastoids are stable and well aerated. Other: Asymmetric left periorbital and forehead scalp soft tissue  swelling. Intraorbital soft tissues appear to remain normal. IMPRESSION: 1. Left periorbital and forehead scalp soft tissue swelling without underlying acute fracture. 2. No acute intracranial abnormality. 3. Chronic bilateral lamina papyracea and left anterior frontal sinus fractures. Electronically Signed   By: Genevie Ann M.D.   On: 04/07/2021 11:50   CT CERVICAL SPINE WO CONTRAST  Result Date: 04/07/2021 CLINICAL DATA:  70 year old male status post seizure like episode. Hypotensive. EXAM: CT CERVICAL SPINE WITHOUT CONTRAST TECHNIQUE: Multidetector CT imaging of the cervical spine was performed without intravenous contrast. Multiplanar CT image reconstructions were also generated. COMPARISON:  Head CT today reported separately. Cervical spine CT 11/23/2020. FINDINGS: Study is intermittently degraded by motion artifact. Alignment: Stable since February, with degenerative appearing anterolisthesis of C4 on C5. Cervicothoracic junction alignment is within normal limits. Skull base and vertebrae: Visualized skull base is intact. No atlanto-occipital dissociation. C1 and C2 appear intact and aligned. No acute osseous abnormality identified. Soft tissues and spinal canal: No prevertebral fluid or swelling. No visible canal hematoma. Calcified cervical carotid atherosclerosis. Otherwise negative visible noncontrast neck soft tissues. Disc levels:  Stable chronic degeneration. Upper chest: Stable, negative. IMPRESSION: Mildly degraded by motion. No acute traumatic injury identified in the cervical spine. Electronically Signed   By: Genevie Ann M.D.   On: 04/07/2021 11:54        Scheduled Meds:  carbidopa-levodopa  3 tablet Oral QID   divalproex  250 mg Oral BID   enoxaparin (LOVENOX) injection  40 mg Subcutaneous Q24H   gabapentin  300 mg Oral TID   insulin aspart  0-5 Units Subcutaneous QHS   insulin aspart  0-9 Units Subcutaneous TID WC   QUEtiapine  25 mg Oral Daily   QUEtiapine  50 mg Oral QHS    Continuous Infusions:  ceFEPime (MAXIPIME) IV 2 g (04/08/21 0454)   lactated ringers 50 mL/hr at 04/07/21 1629   metronidazole 500 mg (04/08/21 0255)     LOS: 2 days    Time spent: 32 mins     Wyvonnia Dusky, MD [facility] Pager [phone]  If 7PM-7AM, please contact night-coverage 04/08/2021, 8:59 AM

## 2021-04-09 DIAGNOSIS — E119 Type 2 diabetes mellitus without complications: Secondary | ICD-10-CM

## 2021-04-09 LAB — GLUCOSE, CAPILLARY
Glucose-Capillary: 75 mg/dL (ref 70–99)
Glucose-Capillary: 111 mg/dL — ABNORMAL HIGH (ref 70–99)
Glucose-Capillary: 102 mg/dL — ABNORMAL HIGH (ref 70–99)
Glucose-Capillary: 132 mg/dL — ABNORMAL HIGH (ref 70–99)
Glucose-Capillary: 322 mg/dL — ABNORMAL HIGH (ref 70–99)

## 2021-04-09 LAB — CBC
HCT: 34.1 % — ABNORMAL LOW (ref 39.0–52.0)
Hemoglobin: 10.6 g/dL — ABNORMAL LOW (ref 13.0–17.0)
MCH: 26.5 pg (ref 26.0–34.0)
MCHC: 31.1 g/dL (ref 30.0–36.0)
MCV: 85.3 fL (ref 80.0–100.0)
Platelets: 188 10*3/uL (ref 150–400)
RBC: 4 MIL/uL — ABNORMAL LOW (ref 4.22–5.81)
RDW: 15 % (ref 11.5–15.5)
WBC: 5.6 10*3/uL (ref 4.0–10.5)
nRBC: 0 % (ref 0.0–0.2)

## 2021-04-09 LAB — BASIC METABOLIC PANEL
Anion gap: 5 (ref 5–15)
BUN: 17 mg/dL (ref 8–23)
CO2: 27 mmol/L (ref 22–32)
Calcium: 8.7 mg/dL — ABNORMAL LOW (ref 8.9–10.3)
Chloride: 109 mmol/L (ref 98–111)
Creatinine, Ser: 0.69 mg/dL (ref 0.61–1.24)
GFR, Estimated: >60 mL/min (ref >60)
Glucose, Bld: 84 mg/dL (ref 70–99)
Potassium: 3.6 mmol/L (ref 3.5–5.1)
Sodium: 141 mmol/L (ref 135–145)

## 2021-04-09 NOTE — NC FL2 (Signed)
[place] MEDICAID FL2 LEVEL OF CARE SCREENING TOOL     IDENTIFICATION  Patient Name: Kevin Bonilla Birthdate: 1951/04/06 Sex: male Admission Date (Current Location): 04/05/2021  [facility] [place] Number:  Chiropodist and Address:  [facility], [address] [place], [place] [address]      Provider Number: [id number]  Attending Physician Name and Address:  Charise Killian, MD  Relative Name and Phone Number:  Marylu Lund (spouse) [phone]    Current Level of Care: Hospital Recommended Level of Care: Skilled Nursing Facility Prior Approval Number:    Date Approved/Denied:   PASRR Number: [id number]  Discharge Plan: SNF    Current Diagnoses: Patient Active Problem List   Diagnosis Date Noted   Acute colitis 04/06/2021   Compression fracture of T8 vertebra (HCC) 04/06/2021   Severe sepsis (HCC) 04/06/2021   Hypotension 04/06/2021   COPD (chronic obstructive pulmonary disease) (HCC) 04/06/2021   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/10/2019   Erectile dysfunction due to arterial insufficiency 04/24/2019   Major depressive disorder, recurrent, in partial remission (HCC) 01/15/2019   Recurrent falls 01/15/2019   Lung nodule 09/13/2018   Moderate episode of recurrent major depressive disorder (HCC) 09/13/2018   Ambulatory dysfunction 05/10/2018   Heart murmur, systolic 05/10/2018   Parkinson's disease (HCC) 01/10/2018   DDD (degenerative disc disease), cervical 10/19/2017   DDD (degenerative disc disease), thoracic 10/19/2017   Osteoarthritis 10/19/2017   Chronic myofascial pain (Between shoulder blades/rhomboids) (Bilateral) 10/19/2017   Cervical radiculitis (Bilateral) 10/19/2017   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) 10/19/2017   Depression 10/18/2017   Diabetes mellitus type 2, uncomplicated (HCC) 10/18/2017   Heart murmur 10/18/2017   Pharmacologic therapy 10/18/2017   Problems influencing health  status 10/18/2017   Long term prescription opiate use 10/18/2017   Opiate use 10/18/2017   Chronic upper back pain (midline) 10/18/2017   Cervical Anterolisthesis (2.5 cm) (C4 on C5) 10/18/2017   Closed fracture of ribs (1-8), sequela (Right) 10/18/2017   Osteoarthritis of shoulder (Right) 10/18/2017   Osteoarthritis of ankle (Right) 10/18/2017   Peripheral vascular disease (HCC) 10/18/2017   Chronic thoracic back pain (Primary Area of Pain) (Midline) 10/18/2017   Chronic hip pain (Secondary Area of Pain) (Bilateral) (L>R) 10/18/2017   NSAID long-term use 10/18/2017   Disorder of skeletal system 10/03/2017   Other long term (current) drug therapy 10/03/2017   Seizure-like activity (HCC) 10/03/2017   Chronic pain syndrome 10/03/2017   Chronic lower extremity pain (Left) 06/22/2017   Trochanteric bursitis, left hip 06/22/2017   TIA (transient ischemic attack) 03/19/2017   Closed intertrochanteric fracture of femur, sequela 01/09/2017   Fall from horse 01/09/2017   History of hyperlipidemia 11/14/2015   Lumbar spondylosis 11/03/2015   Hyponatremia 10/01/2015   Hypo-osmolality and hyponatremia 10/01/2015   Anxiety disorder, unspecified 05/26/2015   Diabetes (HCC) 05/26/2015   Osteoarthritis of knee (Right) 05/26/2015   Obesity, unspecified 05/26/2015   Essential (primary) hypertension 02/26/2015   Gastro-esophageal reflux disease with esophagitis 05/08/2010   Obsessive-compulsive disorder 03/10/2009   Insomnia 03/10/2009   Hyperglyceridemia, pure 09/27/2006   ED (erectile dysfunction) of organic origin 08/27/2005    Orientation RESPIRATION BLADDER Height & Weight     Self (nonverbal)  Normal Continent, External catheter Weight: 180 lb 12.4 oz (82 kg) Height:  5\' 8"  (172.7 cm)  BEHAVIORAL SYMPTOMS/MOOD NEUROLOGICAL BOWEL NUTRITION STATUS      Incontinent Diet (see discharge summary)  AMBULATORY STATUS COMMUNICATION OF NEEDS Skin   Total  Care Non-Verbally Normal                        Personal Care Assistance Level of Assistance  Bathing, Dressing, Total care, Feeding Bathing Assistance: Maximum assistance Feeding assistance: Maximum assistance Dressing Assistance: Maximum assistance Total Care Assistance: Maximum assistance   Functional Limitations Info  Sight, Hearing, Speech Sight Info: Adequate Hearing Info: Adequate Speech Info: Impaired (mute/non verbal)    SPECIAL CARE FACTORS FREQUENCY                       Contractures Contractures Info: Not present    Additional Factors Info  Code Status, Allergies Code Status Info: DNR Allergies Info: Lisinopril, Buspirone           Current Medications (04/09/2021):  This is the current hospital active medication list Current Facility-Administered Medications  Medication Dose Route Frequency Provider Last Rate Last Admin   acetaminophen (TYLENOL) tablet 650 mg  650 mg Oral Q6H PRN Andris Baumann, MD       Or   acetaminophen (TYLENOL) suppository 650 mg  650 mg Rectal Q6H PRN Andris Baumann, MD       albuterol (PROVENTIL) (2.5 MG/3ML) 0.083% nebulizer solution 2.5 mg  2.5 mg Nebulization Q6H PRN Charise Killian, MD       carbidopa-levodopa (SINEMET IR) 25-100 MG per tablet immediate release 3 tablet  3 tablet Oral QID Andris Baumann, MD   3 tablet at 04/09/21 1400   ceFEPIme (MAXIPIME) 2 g in sodium chloride 0.9 % 100 mL IVPB  2 g Intravenous Q8H Beers, Brandon D, RPH 200 mL/hr at 04/09/21 1408 2 g at 04/09/21 1408   divalproex (DEPAKOTE) DR tablet 250 mg  250 mg Oral BID Lindajo Royal V, MD   250 mg at 04/09/21 0900   DULoxetine (CYMBALTA) DR capsule 60 mg  60 mg Oral QHS Martyn Malay, RPH   60 mg at 04/08/21 2235   enoxaparin (LOVENOX) injection 40 mg  40 mg Subcutaneous Q24H Lindajo Royal V, MD   40 mg at 04/09/21 1443   gabapentin (NEURONTIN) capsule 300 mg  300 mg Oral TID Andris Baumann, MD   300 mg at 04/09/21 1404   insulin aspart (novoLOG) injection 0-5 Units  0-5 Units  Subcutaneous QHS Lindajo Royal V, MD       insulin aspart (novoLOG) injection 0-9 Units  0-9 Units Subcutaneous TID WC Andris Baumann, MD   1 Units at 04/08/21 1734   lactated ringers infusion   Intravenous Continuous Lynn Ito, MD 50 mL/hr at 04/09/21 1406 New Bag at 04/09/21 1406   metroNIDAZOLE (FLAGYL) IVPB 500 mg  500 mg Intravenous Q8H Lindajo Royal V, MD 100 mL/hr at 04/09/21 0900 500 mg at 04/09/21 0900   ondansetron (ZOFRAN) tablet 4 mg  4 mg Oral Q6H PRN Andris Baumann, MD       Or   ondansetron [facility]) injection 4 mg  4 mg Intravenous Q6H PRN Andris Baumann, MD       QUEtiapine (SEROQUEL) tablet 25 mg  25 mg Oral Daily Lindajo Royal V, MD   25 mg at 04/09/21 0900   QUEtiapine (SEROQUEL) tablet 50 mg  50 mg Oral QHS Andris Baumann, MD   50 mg at 04/08/21 2235   tamsulosin (FLOMAX) capsule 0.4 mg  0.4 mg Oral Daily Martyn Malay, RPH   0.4 mg at 04/09/21 0900  Discharge Medications: Please see discharge summary for a list of discharge medications.  Relevant Imaging Results:  Relevant Lab Results:   Additional Information SSN: [id number]  Gildardo Griffes, LCSW

## 2021-04-09 NOTE — Progress Notes (Addendum)
  Speech Language Pathology Treatment: Dysphagia  Patient Details Name: Kevin Bonilla MRN: [id number] DOB: Jan 24, 1951 Today's Date: 04/09/2021 Time: 8563-1497 SLP Time Calculation (min) (ACUTE ONLY): 25 min  Assessment / Plan / Recommendation Clinical Impression  Visited during lunch meal today. Pt had eaten 50 percent of tray and was feeding himself at the time ST entered the room. He was sitting fully upright but noted to have spilled his liquids on the tray and all over his gown, He also had a large amount of chopped carrots and chopped meat in his mouth. He needed max cues to clear the chopped food and also needed to alternate liquids with the solids. St assisted with the rest of the meal. Extended time and cues needed to clear all chopped foods. Pt also presented with a wet cough at the end of the meal. Will alter diet to Dysphagia 1 for ease of swallowing and less risk of aspiration. Rec Pt have full supervision at meals and assist as needed. New sign posted above bed. Secure message sent to Nsg about the diet change and recs. ST to follow up 1-2 days and consider MBSS if still with concerns of aspiration even when being fed or fully supervised with meals    HPI HPI: Kevin Bonilla is a 70 y.o. 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 No further evidence of seizure reported. GI was consulted; differential dx high likelihood of acute infectious colitis. Pt known to SLP service from prior course of therapy for dysarthria. Had MBS at OSH in 2020 with recommendation for regular diet/thin liquids. Referred for swallowing evaluation.      SLP Plan  Continue with current plan of care       Recommendations  Diet  recommendations: Dysphagia 1 (puree);Thin liquid Liquids provided via: Straw Medication Administration: Whole meds with puree Supervision: Staff to assist with self feeding;Trained caregiver to feed patient;Full supervision/cueing for compensatory strategies Compensations: Minimize environmental distractions;Small sips/bites;Slow rate Postural Changes and/or Swallow Maneuvers: Seated upright 90 degrees;Upright 30-60 min after meal                Oral Care Recommendations: Oral care BID Follow up Recommendations: Skilled Nursing facility SLP Visit Diagnosis: Dysphagia, oropharyngeal phase (R13.12) Plan: Continue with current plan of care       GO                Eather Colas 04/09/2021, 1:20 PM

## 2021-04-09 NOTE — Care Management Important Message (Signed)
Important Message  Patient Details  Name: Kevin Bonilla MRN: [id number] Date of Birth: [birth year]   Medicare Important Message Given:  Yes  Reviewed with Veleta Miners, spouse at [phone].  Aware of Medicare IM.  Copy of Medicare IM left in room for reference.   [name] 04/09/2021, 1:14 PM

## 2021-04-09 NOTE — Progress Notes (Signed)
PROGRESS NOTE    Kevin Bonilla  LTJ:[id number] DOB: April 11, 1951 DOA: 04/05/2021 PCP: Tracie Harrier, MD   Assessment & Plan:   Principal Problem:   Severe sepsis ([place]) Active Problems:   Essential (primary) hypertension   Seizure-like activity ([place])   Diabetes mellitus type 2, uncomplicated ([place])   Parkinson's disease ([place])   Acute colitis   Compression fracture of T8 vertebra ([place])   Hypotension   COPD (chronic obstructive pulmonary disease) ([facility])   Severe sepsis: met criteria w/ leukocytosis, elevated lactic acid, hypotension & acute colitis. GI PCR panel was neg. Continue on IV flagyl, cefepime. Continue on IVFs. Continue on abxs for 3-5 days as per GI. Severe sepsis resolved  Acute colitis: slowly improving. Continue on IV cefepime, flagyl. GI recs apprec   Hypotension: resolved   Hypokalemia: WNL today   Unlikely seizure-like activity: neurology was consulted, via chat Dr. Rory Percy did not feel this was seizure after reviewing pts chart as per admitting physician, Dr. Damita Dunnings   DM2: well controlled w/ HbA1c 5.1. Continue on SSI w/ accuchecks   Parkinson's disease: continue on home dose of carbidopa-levodopa   Compression fracture of T8 vertebra : new finding but appears chronic as per CT scan    COPD: w/o exacerbation. Continue on bronchodilators  DVT prophylaxis: lovenox  Code Status: full  Family Communication: discussed pt's care w/ pt's wife, [name], and answered her questions  Disposition Plan:  will go back home to [facility]   Level of care: Progressive Cardiac  Status is: Inpatient  Remains inpatient appropriate because:IV treatments appropriate due to intensity of illness or inability to take PO and Inpatient level of care appropriate due to severity of illness  Dispo: The patient is from: Home              Anticipated d/c is to: Home vs SNF              Patient currently is not medically stable to d/c.   Difficult to place patient :  unclear        Consultants:  GI  Procedures:   Antimicrobials: cefepime, flagyl   Subjective: Pt is awake and alert. Pt denies any pain. Pt is nonverbal at baseline   Objective: Vitals:   04/08/21 1612 04/08/21 1957 04/09/21 0417 04/09/21 0741  BP: 112/62 125/70 (!) 167/81 (!) 145/63  Pulse: 65 65 63 68  Resp: $Remo'18 16 18 18  'TRzOM$ Temp: 97.8 F (36.6 C) (!) 97.5 F (36.4 C) (!) 97.5 F (36.4 C) (!) 97.5 F (36.4 C)  TempSrc:      SpO2: 100% 97% 97% 98%  Weight:      Height:        Intake/Output Summary (Last 24 hours) at 04/09/2021 0804 Last data filed at 04/09/2021 0742 Gross per 24 hour  Intake 1466.37 ml  Output 600 ml  Net 866.37 ml   Filed Weights   04/05/21 2325  Weight: 82 kg    Examination:  General exam: Appears calm and comfortable  Respiratory system: Clear breath sounds b/l  Cardiovascular system: S1/S2+. Systolic murmur. No rubs or clicks Gastrointestinal system: Abd is soft, NT, ND & hypoactive bowel sounds  Central nervous system: Awake and alert. Moves all extremities  Psychiatry: Judgement and insight appear abnormal. Flat mood and affect    Data Reviewed: I have personally reviewed following labs and imaging studies  CBC: Recent Labs  Lab 04/05/21 2336 04/06/21 0703 04/09/21 0635  WBC 16.1* 10.4 5.6  NEUTROABS 14.4*  --   --  HGB 11.7* 11.8* 10.6*  HCT 38.4* 36.5* 34.1*  MCV 87.9 83.1 85.3  PLT 333 248 193   Basic Metabolic Panel: Recent Labs  Lab 04/05/21 2336 04/06/21 0703 04/07/21 0833 04/08/21 0652 04/09/21 0635  NA 138 139 139 139 141  K 5.5* 3.7 3.1* 2.8* 3.6  CL 105 105 103 102 109  CO2 26 27 28 28 27   GLUCOSE 218* 115* 110* 89 84  BUN 31* 32* 25* 19 17  CREATININE 1.23 1.09 0.94 0.94 0.69  CALCIUM 8.3* 8.0* 8.5* 8.6* 8.7*  MG 2.2  --   --   --   --    GFR: Estimated Creatinine Clearance: 86.7 mL/min (by C-G formula based on SCr of 0.69 mg/dL). Liver Function Tests: Recent Labs  Lab 04/05/21 2336  AST 15   ALT <5  ALKPHOS 55  BILITOT 1.3*  PROT 5.0*  ALBUMIN 3.3*   No results for input(s): LIPASE, AMYLASE in the last 168 hours. No results for input(s): AMMONIA in the last 168 hours. Coagulation Profile: Recent Labs  Lab 04/06/21 0703  INR 1.1   Cardiac Enzymes: No results for input(s): CKTOTAL, CKMB, CKMBINDEX, TROPONINI in the last 168 hours. BNP (last 3 results) No results for input(s): PROBNP in the last 8760 hours. HbA1C: No results for input(s): HGBA1C in the last 72 hours.  CBG: Recent Labs  Lab 04/08/21 0801 04/08/21 1117 04/08/21 1614 04/08/21 2051 04/09/21 0740  GLUCAP 71 98 125* 86 75   Lipid Profile: No results for input(s): CHOL, HDL, LDLCALC, TRIG, CHOLHDL, LDLDIRECT in the last 72 hours. Thyroid Function Tests: No results for input(s): TSH, T4TOTAL, FREET4, T3FREE, THYROIDAB in the last 72 hours. Anemia Panel: No results for input(s): VITAMINB12, FOLATE, FERRITIN, TIBC, IRON, RETICCTPCT in the last 72 hours. Sepsis Labs: Recent Labs  Lab 04/06/21 0143 04/06/21 0635 04/06/21 0703 04/06/21 1044 04/07/21 1643  PROCALCITON  --   --  1.90  --   --   LATICACIDVEN 4.0* 1.6  --  2.7* 0.9    Recent Results (from the past 240 hour(s))  C Difficile Quick Screen w PCR reflex     Status: None   Collection Time: 04/05/21 11:32 PM   Specimen: STOOL  Result Value Ref Range Status   C Diff antigen NEGATIVE NEGATIVE Final   C Diff toxin NEGATIVE NEGATIVE Final   C Diff interpretation No C. difficile detected.  Final    Comment: Performed at [facility], [address] [place], [place] [address]  Resp Panel by RT-PCR (Flu A&B, Covid) Nasopharyngeal Swab     Status: None   Collection Time: 04/05/21 11:58 PM   Specimen: Nasopharyngeal Swab; Nasopharyngeal(NP) swabs in vial transport medium  Result Value Ref Range Status   SARS Coronavirus 2 by RT PCR NEGATIVE NEGATIVE Final    Comment: (NOTE) SARS-CoV-2 target nucleic acids are NOT DETECTED.  The  SARS-CoV-2 RNA is generally detectable in upper respiratory specimens during the acute phase of infection. The lowest concentration of SARS-CoV-2 viral copies this assay can detect is 138 copies/mL. A negative result does not preclude SARS-Cov-2 infection and should not be used as the sole basis for treatment or other patient management decisions. A negative result may occur with  improper specimen collection/handling, submission of specimen other than nasopharyngeal swab, presence of viral mutation(s) within the areas targeted by this assay, and inadequate number of viral copies(<138 copies/mL). A negative result must be combined with clinical observations, patient history, and epidemiological information. The expected  result is Negative.  Fact Sheet for Patients:  EntrepreneurPulse.com.au  Fact Sheet for Healthcare Providers:  IncredibleEmployment.be  This test is no t yet approved or cleared by the [place] FDA and  has been authorized for detection and/or diagnosis of SARS-CoV-2 by FDA under an Emergency Use Authorization (EUA). This EUA will remain  in effect (meaning this test can be used) for the duration of the COVID-19 declaration under Section 564(b)(1) of the Act, 21 U.S.C.section 360bbb-3(b)(1), unless the authorization is terminated  or revoked sooner.       Influenza A by PCR NEGATIVE NEGATIVE Final   Influenza B by PCR NEGATIVE NEGATIVE Final    Comment: (NOTE) The Xpert Xpress SARS-CoV-2/FLU/RSV plus assay is intended as an aid in the diagnosis of influenza from Nasopharyngeal swab specimens and should not be used as a sole basis for treatment. Nasal washings and aspirates are unacceptable for Xpert Xpress SARS-CoV-2/FLU/RSV testing.  Fact Sheet for Patients: EntrepreneurPulse.com.au  Fact Sheet for Healthcare Providers: IncredibleEmployment.be  This test is not yet approved or  cleared by the [place] FDA and has been authorized for detection and/or diagnosis of SARS-CoV-2 by FDA under an Emergency Use Authorization (EUA). This EUA will remain in effect (meaning this test can be used) for the duration of the COVID-19 declaration under Section 564(b)(1) of the Act, 21 U.S.C. section 360bbb-3(b)(1), unless the authorization is terminated or revoked.  Performed at [facility], [address] [place], [place] [address]   Culture, blood (single)     Status: None (Preliminary result)   Collection Time: 04/06/21  1:05 AM   Specimen: BLOOD  Result Value Ref Range Status   Specimen Description BLOOD RIGHT ASSIST CONTROL  Final   Special Requests   Final    BOTTLES DRAWN AEROBIC AND ANAEROBIC Blood Culture adequate volume   Culture   Final    NO GROWTH 3 DAYS Performed at [facility], [address], [place], [place] [address]    Report Status PENDING  Incomplete  MRSA Next Gen by PCR, Nasal     Status: None   Collection Time: 04/06/21  4:02 AM   Specimen: Nasal Mucosa; Nasal Swab  Result Value Ref Range Status   MRSA by PCR Next Gen NOT DETECTED NOT DETECTED Final    Comment: (NOTE) The GeneXpert MRSA Assay (FDA approved for NASAL specimens only), is one component of a comprehensive MRSA colonization surveillance program. It is not intended to diagnose MRSA infection nor to guide or monitor treatment for MRSA infections. Test performance is not FDA approved in patients less than 37 years old. Performed at [facility], [address] [place], [place] [address]   Gastrointestinal Panel by PCR , Stool     Status: None   Collection Time: 04/06/21  3:00 PM   Specimen: Stool  Result Value Ref Range Status   Campylobacter species NOT DETECTED NOT DETECTED Final   Plesimonas shigelloides NOT DETECTED NOT DETECTED Final   Salmonella species NOT DETECTED NOT DETECTED Final   Yersinia enterocolitica NOT DETECTED NOT DETECTED  Final   Vibrio species NOT DETECTED NOT DETECTED Final   Vibrio cholerae NOT DETECTED NOT DETECTED Final   Enteroaggregative E coli (EAEC) NOT DETECTED NOT DETECTED Final   Enteropathogenic E coli (EPEC) NOT DETECTED NOT DETECTED Final   Enterotoxigenic E coli (ETEC) NOT DETECTED NOT DETECTED Final   Shiga like toxin producing E coli (STEC) NOT DETECTED NOT DETECTED Final   Shigella/Enteroinvasive E coli (EIEC)  NOT DETECTED NOT DETECTED Final   Cryptosporidium NOT DETECTED NOT DETECTED Final   Cyclospora cayetanensis NOT DETECTED NOT DETECTED Final   Entamoeba histolytica NOT DETECTED NOT DETECTED Final   Giardia lamblia NOT DETECTED NOT DETECTED Final   Adenovirus F40/41 NOT DETECTED NOT DETECTED Final   Astrovirus NOT DETECTED NOT DETECTED Final   Norovirus GI/GII NOT DETECTED NOT DETECTED Final   Rotavirus A NOT DETECTED NOT DETECTED Final   Sapovirus (I, II, IV, and V) NOT DETECTED NOT DETECTED Final    Comment: Performed at [facility], [address] [place], [place] [address]         Radiology Studies: CT HEAD WO CONTRAST  Result Date: 04/07/2021 CLINICAL DATA:  70 year old male status post seizure like episode. Hypotensive. EXAM: CT HEAD WITHOUT CONTRAST TECHNIQUE: Contiguous axial images were obtained from the base of the skull through the vertex without intravenous contrast. COMPARISON:  Head CT 12/04/2020. FINDINGS: Brain: Stable cerebral volume. 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 Patchy mostly frontal horn region white matter hypodensity is stable. No midline shift, ventriculomegaly, mass effect, evidence of mass lesion, intracranial hemorrhage or evidence of cortically based acute infarction. Vascular: Calcified atherosclerosis at the skull base. No suspicious intracranial vascular hyperdensity. Skull: Chronic bilateral lamina papyracea and left anterior frontal sinus fractures are stable since March. No acute osseous abnormality  identified. Sinuses/Orbits: Visualized paranasal sinuses and mastoids are stable and well aerated. Other: Asymmetric left periorbital and forehead scalp soft tissue swelling. Intraorbital soft tissues appear to remain normal. IMPRESSION: 1. Left periorbital and forehead scalp soft tissue swelling without underlying acute fracture. 2. No acute intracranial abnormality. 3. Chronic bilateral lamina papyracea and left anterior frontal sinus fractures. Electronically Signed   By: Genevie Ann M.D.   On: 04/07/2021 11:50   CT CERVICAL SPINE WO CONTRAST  Result Date: 04/07/2021 CLINICAL DATA:  70 year old male status post seizure like episode. Hypotensive. EXAM: CT CERVICAL SPINE WITHOUT CONTRAST TECHNIQUE: Multidetector CT imaging of the cervical spine was performed without intravenous contrast. Multiplanar CT image reconstructions were also generated. COMPARISON:  Head CT today reported separately. Cervical spine CT 11/23/2020. FINDINGS: Study is intermittently degraded by motion artifact. Alignment: Stable since February, with degenerative appearing anterolisthesis of C4 on C5. Cervicothoracic junction alignment is within normal limits. Skull base and vertebrae: Visualized skull base is intact. No atlanto-occipital dissociation. C1 and C2 appear intact and aligned. No acute osseous abnormality identified. Soft tissues and spinal canal: No prevertebral fluid or swelling. No visible canal hematoma. Calcified cervical carotid atherosclerosis. Otherwise negative visible noncontrast neck soft tissues. Disc levels:  Stable chronic degeneration. Upper chest: Stable, negative. IMPRESSION: Mildly degraded by motion. No acute traumatic injury identified in the cervical spine. Electronically Signed   By: Genevie Ann M.D.   On: 04/07/2021 11:54        Scheduled Meds:  carbidopa-levodopa  3 tablet Oral QID   divalproex  250 mg Oral BID   DULoxetine  60 mg Oral QHS   enoxaparin (LOVENOX) injection  40 mg Subcutaneous Q24H    gabapentin  300 mg Oral TID   insulin aspart  0-5 Units Subcutaneous QHS   insulin aspart  0-9 Units Subcutaneous TID WC   QUEtiapine  25 mg Oral Daily   QUEtiapine  50 mg Oral QHS   tamsulosin  0.4 mg Oral Daily   Continuous Infusions:  ceFEPime (MAXIPIME) IV 2 g (04/09/21 3646)   lactated ringers 50 mL/hr at 04/08/21 1619   metronidazole  500 mg (04/09/21 0229)     LOS: 3 days    Time spent: 30 mins     Wyvonnia Dusky, MD [facility] Pager [phone]  If 7PM-7AM, please contact night-coverage 04/09/2021, 8:04 AM

## 2021-04-09 NOTE — Progress Notes (Signed)
PT Cancellation Note  Patient Details Name: Kevin Bonilla MRN: [id number] DOB: 1951-07-03   Cancelled Treatment:    Reason Eval/Treat Not Completed: PT screened, no needs identified, will sign off. Pt spoke with family as well as Armed forces operational officer. Facility confirmed patient is a long term care resident. At baseline is TOTALA for ADLs, including assistance needed with feeding. Pt squat pivots to chair with 2 person assist, TOTALA. Due to patient appearing to be at baseline level of functioning, no acute PT needs indicated, PT to sign off. Please re-consult if acute needs arise.    Olga Coaster PT, DPT 2:56 PM,04/09/21

## 2021-04-09 NOTE — TOC Progression Note (Signed)
Transition of Care [facility]) - Progression Note    Patient Details  Name: Kevin Bonilla MRN: [id number] Date of Birth: April 28, 1951  Transition of [facility]) CM/SW Contact  Gildardo Griffes, [place] Phone Number: 04/09/2021, 4:03 PM  Clinical Narrative:     CSW spoke with Verlon Au at [facility] to update patient will be discharged back to facility once medically cleared.   No questions or concerns at this time.   Expected Discharge Plan: Skilled Nursing Facility Barriers to Discharge: Continued Medical Work up  Expected Discharge Plan and Services Expected Discharge Plan: Skilled Nursing Facility       Living arrangements for the past 2 months: Skilled Nursing Facility Environmental consultant)                                       Social Determinants of Health (SDOH) Interventions    Readmission Risk Interventions No flowsheet data found.

## 2021-04-09 NOTE — Evaluation (Signed)
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 GI was consulted; differential dx high likelihood of acute infectious colitis.   Clinical Impression   Pt seen for OT eval this date in setting of acute hospitalization d/t colitis. Per facility, pt was mostly dependent at baseline, including requiring 2p assist for squat-pivot transfer to wheelchair (this information was ascertained after evaluation). Pt currently requiring MIN/MOD A to roll and come to sitting EOB. Pt requires at least MIN A to sustain static sitting balance. For ADLs, he is currently requiring MOD A for supported sitting UB ADLs and MAX to TOTAL A for LB. Pt appears to be at his functional baseline. No further OT needs perceived at this time. Will complete orders.     Follow Up Recommendations  No OT follow up    Equipment Recommendations  Other (comment) (defer to his facility)    Recommendations for Other Services       Precautions / Restrictions Precautions Precautions: Fall Restrictions Weight Bearing Restrictions: No      Mobility Bed Mobility Overal bed mobility: Needs Assistance Bed Mobility: Rolling;Supine to Sit;Sit to Supine Rolling: Min assist;Mod assist   Supine to sit: Min assist;Mod assist Sit to supine: Min assist;Mod assist        Transfers                 General transfer comment: deferred    Balance Overall balance assessment: Needs assistance   Sitting balance-Leahy Scale: Poor Sitting balance - Comments: requires at least MIN A to sustain static sitting balance.       Standing  balance comment: deferred, pt requires 2p to squat-pivot to w/c at baseline                           ADL either performed or assessed with clinical judgement   ADL Overall ADL's : Needs assistance/impaired                                       General ADL Comments: pt requiring MIN/MOD A to come to sitting and MOD A to roll in bed. Pt required TOTAL A for all LB ADLs bed level or sitting, MOD A for self-feeding. Apparently, while he is able to self-feed at baseline, staff at facility help him as he is very messy.     Vision   Additional Comments: unable to ascertain formal viaual assessment. but pt tracks appropriately throughout     Perception     Praxis      Pertinent Vitals/Pain Pain Assessment: Faces Faces Pain Scale: No hurt     Hand Dominance     Extremity/Trunk Assessment Upper Extremity Assessment Upper Extremity Assessment: Generalized weakness   Lower Extremity Assessment Lower Extremity Assessment: Generalized weakness       Communication     Cognition Arousal/Alertness: Awake/alert Behavior During Therapy: WFL for tasks assessed/performed Overall Cognitive Status: History of cognitive impairments - at baseline  General Comments: Pt appears to appropriately answer yes/no questions with head shake/nod. Non-verbal, difficult to ascertain pt's orientation. Pt follows simple one step commands well.   General Comments       Exercises     Shoulder Instructions      Home Living Family/patient expects to be discharged to:: Skilled nursing facility                                        Prior Functioning/Environment Level of Independence: Needs assistance  Gait / Transfers Assistance Needed: per facility, pt requires 2p assist to SPS to chair ADL's / Homemaking Assistance Needed: MAX to TOTAL A with most self-care, was able to feed himself, but messy per  facility Communication / Swallowing Assistance Needed: non-verbal at baseline          OT Problem List: Impaired balance (sitting and/or standing)      OT Treatment/Interventions: Self-care/ADL training;Therapeutic activities    OT Goals(Current goals can be found in the care plan section) Acute Rehab OT Goals Patient Stated Goal: none stated OT Goal Formulation: All assessment and education complete, DC therapy  OT Frequency:     Barriers to D/C:            Co-evaluation              AM-PAC OT "6 Clicks" Daily Activity     Outcome Measure Help from another person eating meals?: A Lot Help from another person taking care of personal grooming?: A Lot Help from another person toileting, which includes using toliet, bedpan, or urinal?: Total Help from another person bathing (including washing, rinsing, drying)?: Total Help from another person to put on and taking off regular upper body clothing?: A Lot Help from another person to put on and taking off regular lower body clothing?: Total 6 Click Score: 9   End of Session Nurse Communication: Other (comment) (IV infusion complete)  Activity Tolerance: Patient tolerated treatment well Patient left: in bed;with call bell/phone within reach;with bed alarm set  OT Visit Diagnosis: Muscle weakness (generalized) (M62.81)                Time: 5701-7793 OT Time Calculation (min): 28 min Charges:  OT General Charges $OT Visit: 1 Visit OT Evaluation $OT Eval Moderate Complexity: 1 [address] [place], OTR/L ascom [phone] 04/09/21, 4:55 PM

## 2021-04-10 LAB — CBC
HCT: 31.7 % — ABNORMAL LOW (ref 39.0–52.0)
Hemoglobin: 10.1 g/dL — ABNORMAL LOW (ref 13.0–17.0)
MCH: 26.7 pg (ref 26.0–34.0)
MCHC: 31.9 g/dL (ref 30.0–36.0)
MCV: 83.9 fL (ref 80.0–100.0)
Platelets: 200 10*3/uL (ref 150–400)
RBC: 3.78 MIL/uL — ABNORMAL LOW (ref 4.22–5.81)
RDW: 14.9 % (ref 11.5–15.5)
WBC: 7.3 10*3/uL (ref 4.0–10.5)
nRBC: 0 % (ref 0.0–0.2)

## 2021-04-10 LAB — GLUCOSE, CAPILLARY
Glucose-Capillary: 77 mg/dL (ref 70–99)
Glucose-Capillary: 135 mg/dL — ABNORMAL HIGH (ref 70–99)
Glucose-Capillary: 132 mg/dL — ABNORMAL HIGH (ref 70–99)
Glucose-Capillary: 124 mg/dL — ABNORMAL HIGH (ref 70–99)

## 2021-04-10 LAB — BASIC METABOLIC PANEL
Anion gap: 3 — ABNORMAL LOW (ref 5–15)
BUN: 18 mg/dL (ref 8–23)
CO2: 27 mmol/L (ref 22–32)
Calcium: 8.4 mg/dL — ABNORMAL LOW (ref 8.9–10.3)
Chloride: 108 mmol/L (ref 98–111)
Creatinine, Ser: 0.73 mg/dL (ref 0.61–1.24)
GFR, Estimated: >60 mL/min (ref >60)
Glucose, Bld: 86 mg/dL (ref 70–99)
Potassium: 3.3 mmol/L — ABNORMAL LOW (ref 3.5–5.1)
Sodium: 138 mmol/L (ref 135–145)

## 2021-04-10 MED ORDER — POTASSIUM CHLORIDE CRYS ER 20 MEQ PO TBCR
20.0000 meq | EXTENDED_RELEASE_TABLET | Freq: Once | ORAL | Status: AC
  Administered 2021-04-10: 20 meq via ORAL
  Filled 2021-04-10: qty 1

## 2021-04-10 NOTE — Discharge Summary (Addendum)
Physician Discharge Summary  MESSIAH AHR BBC:[id number] DOB: 05-04-1951 DOA: 04/05/2021  PCP: Tracie Harrier, MD  Admit date: 04/05/2021 Discharge date: 04/13/2021  [facility]  Recommendations for Outpatient Follow-up:  Follow up with PCP in 1-2 weeks F/u w/ GI, Dr. Vicente Males, in 2-3 weeks  F/u w/ speech therapy at [facility], [place] w/in 1 week   Home Health: no  Equipment/Devices:  Discharge Condition: stable  CODE STATUS:full  Diet recommendation: dysphagia /carb modified    Brief/Interim Summary: HPI was taken from Dr. Damita Dunnings: Kevin Bonilla is a 70 y.o. male with medical history significant for Parkinson's disease, nonverbal at baseline, COPD, HTN, DM with no history of seizure disorder brought in by EMS for evaluation of seizure-like episode that occurred after a several hour history of copious diarrhea.  History is limited as patient is nonverbal and is taken mostly from ED records and EMS report.  Patient apparently had an several episodes of watery diarrhea without blood, or melena and had an episode of shaking and seizure-like activity while being cleaned up.  He was hypotensive with EMS and started on IV fluids.   ED course: On arrival, afebrile, BP 74/53, pulse 90 with respirations 18 and O2 sat 94% on room air.  Blood work with WBC 16,000 and lactic acid 2.8> 4.0.  COVID and flu negative   Imaging: CT abdomen and pelvis consistent with colitis.  Also seen was T8 compression fracture that had a chronic appearance   Patient started on sepsis fluids, Rocephin and Flagyl.  Hospitalist consulted for admission.    As per Dr. Kurtis Bushman: Kevin Bonilla is a 70 y.o. male with medical history significant for Parkinson's disease, nonverbal at baseline, COPD, HTN, DM with no history of seizure disorder brought in by EMS for evaluation of seizure-like episode that occurred after a several hour history of copious diarrhea.  Found with colitis on CT. Started on IV abx. GI consulted.   7/12-this am awake, moving in bed, but nonverbal. Spoke to wife who reports pt at baseline is nonverbal.  Hospital course from Dr. Jimmye Norman 7/13-7/15/22: Pt was found to have severe sepsis likely secondary to acute colitis. GI recommend pt receive abxs for 3-5 days and completed the course while inpatient. The severe sepsis resolved prior to d/c. For more information, please see previous progress/consult notes  Discharge Diagnoses:  Principal Problem:   Severe sepsis ([place]) Active Problems:   Essential (primary) hypertension   Seizure-like activity ([place])   Diabetes mellitus type 2, uncomplicated ([place])   Parkinson's disease ([place])   Acute colitis   Compression fracture of T8 vertebra ([facility])   Hypotension   COPD (chronic obstructive pulmonary disease) ([facility])   Pressure injury of skin  Severe sepsis: met criteria w/ leukocytosis, elevated lactic acid, hypotension & acute colitis. GI PCR panel was neg. Completed abx course. Severe sepsis resolved   Acute colitis: completed abx course. GI recs apprec    Hypotension: resolved   Hypokalemia: resolved    Unlikely seizure-like activity: neurology was consulted, via chat Dr. Rory Percy did not feel this was seizure after reviewing pts chart as per admitting physician, Dr. Damita Dunnings   DM2: well controlled w/ HbA1c 5.1. Diet controlled as an outpatient    Parkinson's disease: continue on home dose of carbidopa-levodopa   Compression fracture of T8 vertebra : new finding but appears chronic as per CT scan    COPD: w/o exacerbation. Continue on bronchodilators  Pressure injury sacrum medial, stage 1:  POA. Continue w/ wound care   Discharge Instructions  Discharge Instructions     Diet - low sodium heart healthy   Complete by: As directed    Dysphagia 1 (puree);Thin liquid Liquids provided via: Straw Medication Administration: Whole meds with puree Supervision: Staff to assist with  self feeding;Trained caregiver to feed patient;Full supervision/cueing for compensatory strategies Compensations: Minimize environmental distractions;Small sips/bites;Slow rate Postural Changes and/or Swallow Maneuvers: Seated upright 90 degrees;Upright 30-60 min after meal   Diet Carb Modified   Complete by: As directed    Dysphagia 1 (puree);Thin liquid Liquids provided via: Straw Medication Administration: Whole meds with puree Supervision: Staff to assist with self feeding;Trained caregiver to feed patient;Full supervision/cueing for compensatory strategies Compensations: Minimize environmental distractions;Small sips/bites;Slow rate Postural Changes and/or Swallow Maneuvers: Seated upright 90 degrees;Upright 30-60 min after meal   Discharge instructions   Complete by: As directed    F/u w/ PCP in 1-2 weeks. F/u w/ GI, Dr. Vicente Males, in 2-3 weeks. F/u w/ speech therapy at [facility] w/in 1 week   Increase activity slowly   Complete by: As directed    Increase activity slowly   Complete by: As directed    No wound care   Complete by: As directed       Allergies as of 04/13/2021       Reactions   Lisinopril Nausea Only   Panic attacks   Lisinopril Nausea Only, Other (See Comments)   Panic attacks   Buspirone Palpitations   Per cardiology, low HR        Medication List     TAKE these medications    amLODipine 5 MG tablet Commonly known as: NORVASC Take 1 tablet by mouth daily.   budesonide-formoterol 160-4.5 MCG/ACT inhaler Commonly known as: SYMBICORT Inhale 2 puffs into the lungs 2 (two) times daily.   carbidopa-levodopa 25-100 MG tablet Commonly known as: SINEMET IR Take 3 tablets by mouth 4 (four) times daily. 0900,1300,1800,2100   celecoxib 100 MG capsule Commonly known as: CELEBREX Take 100 mg by mouth 2 (two) times daily.   divalproex 250 MG DR tablet Commonly known as: DEPAKOTE Take 250 mg by mouth 2 (two) times daily.   DULoxetine HCl 60 MG  Csdr Take 60 mg by mouth at bedtime.   gabapentin 300 MG capsule Commonly known as: NEURONTIN Take 300 mg by mouth 3 (three) times daily.   hydrochlorothiazide 25 MG tablet Commonly known as: HYDRODIURIL Take 25 mg by mouth daily.   Lidocaine 4 % Ptch Apply 1 patch topically daily. Left shoulder   loperamide 2 MG tablet Commonly known as: IMODIUM A-D Take 4 mg by mouth as needed for diarrhea or loose stools.   Oxycodone HCl 10 MG Tabs Take 10 mg by mouth daily.   pantoprazole 40 MG tablet Commonly known as: PROTONIX   QUEtiapine 50 MG tablet Commonly known as: SEROQUEL Take 50 mg by mouth at bedtime.   QUEtiapine 25 MG tablet Commonly known as: SEROQUEL Take 25 mg by mouth daily.   tamsulosin 0.4 MG Caps capsule Commonly known as: FLOMAX Take 1 capsule (0.4 mg total) by mouth daily.   VITAMIN B-12 PO Take 2 tablets by mouth daily.        Follow-up Information     Tracie Harrier, MD. Go on 04/15/2021.   Specialty: Internal Medicine Why: _0 :15pm Contact information: [place] [address] [phone]         Jonathon Bellows, MD Follow up  in 3 week(s).   Specialty: Gastroenterology Contact information: [place] [address] [phone]                Allergies  Allergen Reactions   Lisinopril Nausea Only    Panic attacks   Lisinopril Nausea Only and Other (See Comments)    Panic attacks   Buspirone Palpitations    Per cardiology, low HR    Consultations: GI, Dr. Vicente Males   Procedures/Studies: CT HEAD WO CONTRAST  Result Date: 04/07/2021 CLINICAL DATA:  70 year old male status post seizure like episode. Hypotensive. EXAM: CT HEAD WITHOUT CONTRAST TECHNIQUE: Contiguous axial images were obtained from the base of the skull through the vertex without intravenous contrast. COMPARISON:  Head CT 12/04/2020. FINDINGS: Brain: Stable cerebral volume. Small chronic lacunar  infarcts along the medial left thalamus. Patchy mostly frontal horn region white matter hypodensity is stable. No midline shift, ventriculomegaly, mass effect, evidence of mass lesion, intracranial hemorrhage or evidence of cortically based acute infarction. Vascular: Calcified atherosclerosis at the skull base. No suspicious intracranial vascular hyperdensity. Skull: Chronic bilateral lamina papyracea and left anterior frontal sinus fractures are stable since March. No acute osseous abnormality identified. Sinuses/Orbits: Visualized paranasal sinuses and mastoids are stable and well aerated. Other: Asymmetric left periorbital and forehead scalp soft tissue swelling. Intraorbital soft tissues appear to remain normal. IMPRESSION: 1. Left periorbital and forehead scalp soft tissue swelling without underlying acute fracture. 2. No acute intracranial abnormality. 3. Chronic bilateral lamina papyracea and left anterior frontal sinus fractures. Electronically Signed   By: Genevie Ann M.D.   On: 04/07/2021 11:50   CT CERVICAL SPINE WO CONTRAST  Result Date: 04/07/2021 CLINICAL DATA:  70 year old male status post seizure like episode. Hypotensive. EXAM: CT CERVICAL SPINE WITHOUT CONTRAST TECHNIQUE: Multidetector CT imaging of the cervical spine was performed without intravenous contrast. Multiplanar CT image reconstructions were also generated. COMPARISON:  Head CT today reported separately. Cervical spine CT 11/23/2020. FINDINGS: Study is intermittently degraded by motion artifact. Alignment: Stable since February, with degenerative appearing anterolisthesis of C4 on C5. Cervicothoracic junction alignment is within normal limits. Skull base and vertebrae: Visualized skull base is intact. No atlanto-occipital dissociation. C1 and C2 appear intact and aligned. No acute osseous abnormality identified. Soft tissues and spinal canal: No prevertebral fluid or swelling. No visible canal hematoma. Calcified cervical carotid  atherosclerosis. Otherwise negative visible noncontrast neck soft tissues. Disc levels:  Stable chronic degeneration. Upper chest: Stable, negative. IMPRESSION: Mildly degraded by motion. No acute traumatic injury identified in the cervical spine. Electronically Signed   By: Genevie Ann M.D.   On: 04/07/2021 11:54   CT ABDOMEN PELVIS W CONTRAST  Result Date: 04/06/2021 CLINICAL DATA:  Abdominal pain and diarrhea EXAM: CT ABDOMEN AND PELVIS WITH CONTRAST TECHNIQUE: Multidetector CT imaging of the abdomen and pelvis was performed using the standard protocol following bolus administration of intravenous contrast. CONTRAST:  157m OMNIPAQUE IOHEXOL 300 MG/ML  SOLN COMPARISON:  11/23/2020 FINDINGS: Lower chest: No acute abnormality. Hepatobiliary: Fatty infiltration of the liver is noted. Central cyst in the right lobe of the liver is noted. The gallbladder is well distended. Pancreas: Unremarkable. No pancreatic ductal dilatation or surrounding inflammatory changes. Spleen: Normal in size without focal abnormality. Adrenals/Urinary Tract: Adrenal glands are within normal limits. Kidneys demonstrate a normal enhancement pattern bilaterally. Cystic lesion is noted in the lower pole of the right kidney stable from the prior exam. No obstructive changes are seen. The bladder is decompressed. Stomach/Bowel: Significant  dilatation of the colon is noted with inflammatory changes of the colonic wall and mucosal hyperemia consistent with colitis. This is most notable in the rectosigmoid. The more proximal colon appears within normal limits. The appendix is not well visualized although no inflammatory changes to suggest appendicitis are seen. Fluid-filled loops of distal small bowel are noted consistent with a degree of enteritis. No definitive obstructive changes are seen. Proximal small bowel is decompressed. The stomach is significantly distended with air. Vascular/Lymphatic: Aortic atherosclerosis. No enlarged abdominal or  pelvic lymph nodes. Reproductive: Prostate is unremarkable. Other: Fluid is noted within the right inguinal canal new from the prior exam. Mild free pelvic fluid is noted as well. This is likely reactive in nature Musculoskeletal: Degenerative changes of the lumbar spine are seen. Postsurgical changes in the proximal left femur are noted. T8 compression deformity is noted. This appears chronic in nature but is new from the prior exam. IMPRESSION: Significant dilatation of the distal small bowel and majority of the colon related to colitis and likely subsequent dilatation of the small bowel with decreased transit time. No perforation is noted. Distension of the stomach with air. T8 compression deformity which is new from the prior exam but has a chronic appearance. No retropulsion is noted. Electronically Signed   By: Inez Catalina M.D.   On: 04/06/2021 01:15   (Echo, Carotid, EGD, Colonoscopy, ERCP)    Subjective: Pt is nonverbal at baseline    Discharge Exam: Vitals:   04/13/21 0826 04/13/21 1107  BP: 120/60 106/60  Pulse: 77 67  Resp: 17 18  Temp: 98.6 F (37 C) 98.5 F (36.9 C)  SpO2: 98% 98%   Vitals:   04/13/21 0011 04/13/21 0535 04/13/21 0826 04/13/21 1107  BP: (!) 124/59 135/68 120/60 106/60  Pulse: 66 71 77 67  Resp: _0 Temp: 98.3 F (36.8 C) 97.6 F (36.4 C) 98.6 F (37 C) 98.5 F (36.9 C)  TempSrc: Oral Axillary Oral Oral  SpO2: 97% 96% 98% 98%  Weight:      Height:        General: Pt is alert, awake, not in acute distress Cardiovascular: S1/S2 +, no rubs, no gallops Respiratory: CTA bilaterally, no wheezing, no rhonchi Abdominal: Soft, NT, ND, bowel sounds + Extremities:  no cyanosis    The results of significant diagnostics from this hospitalization (including imaging, microbiology, ancillary and laboratory) are listed below for reference.     Microbiology: Recent Results (from the past 240 hour(s))  C Difficile Quick Screen w PCR reflex     Status:  None   Collection Time: 04/05/21 11:32 PM   Specimen: STOOL  Result Value Ref Range Status   C Diff antigen NEGATIVE NEGATIVE Final   C Diff toxin NEGATIVE NEGATIVE Final   C Diff interpretation No C. difficile detected.  Final    Comment: Performed at [facility], [address] [place], [place] [address]  Resp Panel by RT-PCR (Flu A&B, Covid) Nasopharyngeal Swab     Status: None   Collection Time: 04/05/21 11:58 PM   Specimen: Nasopharyngeal Swab; Nasopharyngeal(NP) swabs in vial transport medium  Result Value Ref Range Status   SARS Coronavirus 2 by RT PCR NEGATIVE NEGATIVE Final    Comment: (NOTE) SARS-CoV-2 target nucleic acids are NOT DETECTED.  The SARS-CoV-2 RNA is generally detectable in upper respiratory specimens during the acute phase of infection. The lowest concentration of SARS-CoV-2 viral copies this assay can detect is 138 copies/mL. A negative  result does not preclude SARS-Cov-2 infection and should not be used as the sole basis for treatment or other patient management decisions. A negative result may occur with  improper specimen collection/handling, submission of specimen other than nasopharyngeal swab, presence of viral mutation(s) within the areas targeted by this assay, and inadequate number of viral copies(<138 copies/mL). A negative result must be combined with clinical observations, patient history, and epidemiological information. The expected result is Negative.  Fact Sheet for Patients:  EntrepreneurPulse.com.au  Fact Sheet for Healthcare Providers:  IncredibleEmployment.be  This test is no t yet approved or cleared by the [place] FDA and  has been authorized for detection and/or diagnosis of SARS-CoV-2 by FDA under an Emergency Use Authorization (EUA). This EUA will remain  in effect (meaning this test can be used) for the duration of the COVID-19 declaration under Section 564(b)(1) of the Act,  21 U.S.C.section 360bbb-3(b)(1), unless the authorization is terminated  or revoked sooner.       Influenza A by PCR NEGATIVE NEGATIVE Final   Influenza B by PCR NEGATIVE NEGATIVE Final    Comment: (NOTE) The Xpert Xpress SARS-CoV-2/FLU/RSV plus assay is intended as an aid in the diagnosis of influenza from Nasopharyngeal swab specimens and should not be used as a sole basis for treatment. Nasal washings and aspirates are unacceptable for Xpert Xpress SARS-CoV-2/FLU/RSV testing.  Fact Sheet for Patients: EntrepreneurPulse.com.au  Fact Sheet for Healthcare Providers: IncredibleEmployment.be  This test is not yet approved or cleared by the [place] FDA and has been authorized for detection and/or diagnosis of SARS-CoV-2 by FDA under an Emergency Use Authorization (EUA). This EUA will remain in effect (meaning this test can be used) for the duration of the COVID-19 declaration under Section 564(b)(1) of the Act, 21 U.S.C. section 360bbb-3(b)(1), unless the authorization is terminated or revoked.  Performed at [facility], [address] [place], [place] [address]   Culture, blood (single)     Status: None   Collection Time: 04/06/21  1:05 AM   Specimen: BLOOD  Result Value Ref Range Status   Specimen Description BLOOD RIGHT ASSIST CONTROL  Final   Special Requests   Final    BOTTLES DRAWN AEROBIC AND ANAEROBIC Blood Culture adequate volume   Culture   Final    NO GROWTH 5 DAYS Performed at [facility], [address], [place], [place] [address]    Report Status 04/11/2021 FINAL  Final  MRSA Next Gen by PCR, Nasal     Status: None   Collection Time: 04/06/21  4:02 AM   Specimen: Nasal Mucosa; Nasal Swab  Result Value Ref Range Status   MRSA by PCR Next Gen NOT DETECTED NOT DETECTED Final    Comment: (NOTE) The GeneXpert MRSA Assay (FDA approved for NASAL specimens only), is one component of a comprehensive  MRSA colonization surveillance program. It is not intended to diagnose MRSA infection nor to guide or monitor treatment for MRSA infections. Test performance is not FDA approved in patients less than 6 years old. Performed at [facility], [address] [place], [place] [address]   Gastrointestinal Panel by PCR , Stool     Status: None   Collection Time: 04/06/21  3:00 PM   Specimen: Stool  Result Value Ref Range Status   Campylobacter species NOT DETECTED NOT DETECTED Final   Plesimonas shigelloides NOT DETECTED NOT DETECTED Final   Salmonella species NOT DETECTED NOT DETECTED Final   Yersinia enterocolitica NOT DETECTED NOT DETECTED  Final   Vibrio species NOT DETECTED NOT DETECTED Final   Vibrio cholerae NOT DETECTED NOT DETECTED Final   Enteroaggregative E coli (EAEC) NOT DETECTED NOT DETECTED Final   Enteropathogenic E coli (EPEC) NOT DETECTED NOT DETECTED Final   Enterotoxigenic E coli (ETEC) NOT DETECTED NOT DETECTED Final   Shiga like toxin producing E coli (STEC) NOT DETECTED NOT DETECTED Final   Shigella/Enteroinvasive E coli (EIEC) NOT DETECTED NOT DETECTED Final   Cryptosporidium NOT DETECTED NOT DETECTED Final   Cyclospora cayetanensis NOT DETECTED NOT DETECTED Final   Entamoeba histolytica NOT DETECTED NOT DETECTED Final   Giardia lamblia NOT DETECTED NOT DETECTED Final   Adenovirus F40/41 NOT DETECTED NOT DETECTED Final   Astrovirus NOT DETECTED NOT DETECTED Final   Norovirus GI/GII NOT DETECTED NOT DETECTED Final   Rotavirus A NOT DETECTED NOT DETECTED Final   Sapovirus (I, II, IV, and V) NOT DETECTED NOT DETECTED Final    Comment: Performed at [facility], [address] [place], [place] [address]     Labs: BNP (last 3 results) No results for input(s): BNP in the last 8760 hours. Basic Metabolic Panel: Recent Labs  Lab 04/09/21 0635 04/10/21 0621 04/11/21 0631 04/12/21 0457 04/13/21 0653  NA 141 138 137 137 137  K 3.6 3.3* 3.8 4.0  4.0  CL 109 108 107 104 102  CO2 _0 GLUCOSE 84 86 92 98 95  BUN _1 CREATININE 0.69 0.73 0.79 0.71 0.72  CALCIUM 8.7* 8.4* 8.4* 8.5* 8.6*   Liver Function Tests: No results for input(s): AST, ALT, ALKPHOS, BILITOT, PROT, ALBUMIN in the last 168 hours.  No results for input(s): LIPASE, AMYLASE in the last 168 hours. No results for input(s): AMMONIA in the last 168 hours. CBC: Recent Labs  Lab 04/09/21 0635 04/10/21 0621 04/11/21 0631 04/12/21 0457 04/13/21 0653  WBC 5.6 7.3 6.7 12.8* 9.0  HGB 10.6* 10.1* 10.3* 11.0* 10.1*  HCT 34.1* 31.7* 31.9* 34.2* 32.6*  MCV 85.3 83.9 85.3 84.7 87.2  PLT 188 200 198 223 225   Cardiac Enzymes: No results for input(s): CKTOTAL, CKMB, CKMBINDEX, TROPONINI in the last 168 hours. BNP: Invalid input(s): POCBNP CBG: Recent Labs  Lab 04/12/21 1112 04/12/21 1645 04/12/21 2021 04/13/21 0828 04/13/21 1108  GLUCAP 134* 90 132* 72 88   D-Dimer No results for input(s): DDIMER in the last 72 hours. Hgb A1c No results for input(s): HGBA1C in the last 72 hours. Lipid Profile No results for input(s): CHOL, HDL, LDLCALC, TRIG, CHOLHDL, LDLDIRECT in the last 72 hours. Thyroid function studies No results for input(s): TSH, T4TOTAL, T3FREE, THYROIDAB in the last 72 hours.  Invalid input(s): FREET3 Anemia work up No results for input(s): VITAMINB12, FOLATE, FERRITIN, TIBC, IRON, RETICCTPCT in the last 72 hours. Urinalysis    Component Value Date/Time   COLORURINE AMBER (A) 04/05/2021 2358   APPEARANCEUR CLEAR (A) 04/05/2021 2358   LABSPEC 1.027 04/05/2021 2358   PHURINE 5.0 04/05/2021 2358   GLUCOSEU NEGATIVE 04/05/2021 2358   HGBUR NEGATIVE 04/05/2021 2358   BILIRUBINUR SMALL (A) 04/05/2021 2358   KETONESUR 5 (A) 04/05/2021 2358   PROTEINUR 30 (A) 04/05/2021 2358   NITRITE NEGATIVE 04/05/2021 2358   LEUKOCYTESUR NEGATIVE 04/05/2021 2358   Sepsis Labs Invalid input(s): PROCALCITONIN,  WBC,   LACTICIDVEN Microbiology Recent Results (from the past 240 hour(s))  C Difficile Quick Screen w PCR reflex     Status: None   Collection Time:  04/05/21 11:32 PM   Specimen: STOOL  Result Value Ref Range Status   C Diff antigen NEGATIVE NEGATIVE Final   C Diff toxin NEGATIVE NEGATIVE Final   C Diff interpretation No C. difficile detected.  Final    Comment: Performed at [facility], [address] [place], [place] [address]  Resp Panel by RT-PCR (Flu A&B, Covid) Nasopharyngeal Swab     Status: None   Collection Time: 04/05/21 11:58 PM   Specimen: Nasopharyngeal Swab; Nasopharyngeal(NP) swabs in vial transport medium  Result Value Ref Range Status   SARS Coronavirus 2 by RT PCR NEGATIVE NEGATIVE Final    Comment: (NOTE) SARS-CoV-2 target nucleic acids are NOT DETECTED.  The SARS-CoV-2 RNA is generally detectable in upper respiratory specimens during the acute phase of infection. The lowest concentration of SARS-CoV-2 viral copies this assay can detect is 138 copies/mL. A negative result does not preclude SARS-Cov-2 infection and should not be used as the sole basis for treatment or other patient management decisions. A negative result may occur with  improper specimen collection/handling, submission of specimen other than nasopharyngeal swab, presence of viral mutation(s) within the areas targeted by this assay, and inadequate number of viral copies(<138 copies/mL). A negative result must be combined with clinical observations, patient history, and epidemiological information. The expected result is Negative.  Fact Sheet for Patients:  EntrepreneurPulse.com.au  Fact Sheet for Healthcare Providers:  IncredibleEmployment.be  This test is no t yet approved or cleared by the [place] FDA and  has been authorized for detection and/or diagnosis of SARS-CoV-2 by FDA under an Emergency Use Authorization (EUA). This EUA will remain  in  effect (meaning this test can be used) for the duration of the COVID-19 declaration under Section 564(b)(1) of the Act, 21 U.S.C.section 360bbb-3(b)(1), unless the authorization is terminated  or revoked sooner.       Influenza A by PCR NEGATIVE NEGATIVE Final   Influenza B by PCR NEGATIVE NEGATIVE Final    Comment: (NOTE) The Xpert Xpress SARS-CoV-2/FLU/RSV plus assay is intended as an aid in the diagnosis of influenza from Nasopharyngeal swab specimens and should not be used as a sole basis for treatment. Nasal washings and aspirates are unacceptable for Xpert Xpress SARS-CoV-2/FLU/RSV testing.  Fact Sheet for Patients: EntrepreneurPulse.com.au  Fact Sheet for Healthcare Providers: IncredibleEmployment.be  This test is not yet approved or cleared by the [place] FDA and has been authorized for detection and/or diagnosis of SARS-CoV-2 by FDA under an Emergency Use Authorization (EUA). This EUA will remain in effect (meaning this test can be used) for the duration of the COVID-19 declaration under Section 564(b)(1) of the Act, 21 U.S.C. section 360bbb-3(b)(1), unless the authorization is terminated or revoked.  Performed at [facility] [facility], [address] [place], [place] [address]   Culture, blood (single)     Status: None   Collection Time: 04/06/21  1:05 AM   Specimen: BLOOD  Result Value Ref Range Status   Specimen Description BLOOD RIGHT ASSIST CONTROL  Final   Special Requests   Final    BOTTLES DRAWN AEROBIC AND ANAEROBIC Blood Culture adequate volume   Culture   Final    NO GROWTH 5 DAYS Performed at [facility], [address] [place], [place] [address]    Report Status 04/11/2021 FINAL  Final  MRSA Next Gen by PCR, Nasal     Status: None   Collection Time: 04/06/21  4:02 AM   Specimen: Nasal Mucosa; Nasal Swab  Result Value Ref Range Status   MRSA by PCR Next Gen NOT DETECTED NOT DETECTED Final     Comment: (NOTE) The GeneXpert MRSA Assay (FDA approved for NASAL specimens only), is one component of a comprehensive MRSA colonization surveillance program. It is not intended to diagnose MRSA infection nor to guide or monitor treatment for MRSA infections. Test performance is not FDA approved in patients less than 43 years old. Performed at [facility], [address] [place], [place] [address]   Gastrointestinal Panel by PCR , Stool     Status: None   Collection Time: 04/06/21  3:00 PM   Specimen: Stool  Result Value Ref Range Status   Campylobacter species NOT DETECTED NOT DETECTED Final   Plesimonas shigelloides NOT DETECTED NOT DETECTED Final   Salmonella species NOT DETECTED NOT DETECTED Final   Yersinia enterocolitica NOT DETECTED NOT DETECTED Final   Vibrio species NOT DETECTED NOT DETECTED Final   Vibrio cholerae NOT DETECTED NOT DETECTED Final   Enteroaggregative E coli (EAEC) NOT DETECTED NOT DETECTED Final   Enteropathogenic E coli (EPEC) NOT DETECTED NOT DETECTED Final   Enterotoxigenic E coli (ETEC) NOT DETECTED NOT DETECTED Final   Shiga like toxin producing E coli (STEC) NOT DETECTED NOT DETECTED Final   Shigella/Enteroinvasive E coli (EIEC) NOT DETECTED NOT DETECTED Final   Cryptosporidium NOT DETECTED NOT DETECTED Final   Cyclospora cayetanensis NOT DETECTED NOT DETECTED Final   Entamoeba histolytica NOT DETECTED NOT DETECTED Final   Giardia lamblia NOT DETECTED NOT DETECTED Final   Adenovirus F40/41 NOT DETECTED NOT DETECTED Final   Astrovirus NOT DETECTED NOT DETECTED Final   Norovirus GI/GII NOT DETECTED NOT DETECTED Final   Rotavirus A NOT DETECTED NOT DETECTED Final   Sapovirus (I, II, IV, and V) NOT DETECTED NOT DETECTED Final    Comment: Performed at [facility], [address] [place], [place] [address]     Time coordinating discharge: Over 30 minutes  SIGNED:   Wyvonnia Dusky, MD  [facility] 04/13/2021, 11:30  AM Pager   If 7PM-7AM, please contact night-coverage

## 2021-04-10 NOTE — Progress Notes (Signed)
PROGRESS NOTE    PINKNEY VENARD  JDB:[id number] DOB: Apr 14, 1951 DOA: 04/05/2021 PCP: Tracie Harrier, MD   Assessment & Plan:   Principal Problem:   Severe sepsis ([place]) Active Problems:   Essential (primary) hypertension   Seizure-like activity ([place])   Diabetes mellitus type 2, uncomplicated ([place])   Parkinson's disease ([place])   Acute colitis   Compression fracture of T8 vertebra ([place])   Hypotension   COPD (chronic obstructive pulmonary disease) ([facility])   Severe sepsis: met criteria w/ leukocytosis, elevated lactic acid, hypotension & acute colitis. GI PCR panel was neg. Completed abx course today. D/c IVFs. Continue on abxs for 3-5 days as per GI. Severe sepsis resolved  Acute colitis: slowly improving. Completed abx course today  GI recs apprec   Hypotension: resolved   Hypokalemia: KCl repleated  Unlikely seizure-like activity: neurology was consulted, via chat Dr. Rory Percy did not feel this was seizure after reviewing pts chart as per admitting physician, Dr. Damita Dunnings   DM2: HbA1c 5.1, well controlled. Continue on SSI w/ accuchecks   Parkinson's disease: continue on home dose of carbidopa-levodopa    Compression fracture of T8 vertebra : new finding but appears chronic as per CT scan    COPD: w/o exacerbation. Continue on bronchodilators   DVT prophylaxis: lovenox  Code Status: full  Family Communication:  Disposition Plan:  will go back home to [place] when [employer] accepts him back which will be Monday as per CM   Level of care: Progressive Cardiac  Status is: Inpatient  Remains inpatient appropriate because:IV treatments appropriate due to intensity of illness or inability to take PO and Inpatient level of care appropriate due to severity of illness  Dispo: The patient is from: Home              Anticipated d/c is to: Home vs SNF              Patient currently is not medically stable to d/c.   Difficult to place patient :  unclear        Consultants:  GI  Procedures:   Antimicrobials:   Subjective: Pt is nonverbal at baseline   Objective: Vitals:   04/09/21 2000 04/10/21 0436 04/10/21 0732 04/10/21 1125  BP: 140/66 (!) 146/71 (!) 155/81 111/65  Pulse: 83 (!) 54 73 83  Resp: 18 18 16 17   Temp: 98.6 F (37 C) (!) 97.5 F (36.4 C) 98.1 F (36.7 C) 98.3 F (36.8 C)  TempSrc: Oral Oral    SpO2: 100% 98% 99% 97%  Weight:      Height:        Intake/Output Summary (Last 24 hours) at 04/10/2021 1248 Last data filed at 04/10/2021 1048 Gross per 24 hour  Intake 600 ml  Output 2 ml  Net 598 ml   Filed Weights   04/05/21 2325  Weight: 82 kg    Examination:  General exam: Appears comfortable  Respiratory system: Clear breath sounds b/l. No rales Cardiovascular system: S1 & S2+. Systolic murmur  Gastrointestinal system: Abd is soft, NT, ND & normal bowel sounds  Central nervous system: Alert and awake. Moves all extremities Psychiatry: Judgement and insight appear abnormal. Flat mood and affect    Data Reviewed: I have personally reviewed following labs and imaging studies  CBC: Recent Labs  Lab 04/05/21 2336 04/06/21 0703 04/09/21 0635 04/10/21 0621  WBC 16.1* 10.4 5.6 7.3  NEUTROABS 14.4*  --   --   --   HGB 11.7*  11.8* 10.6* 10.1*  HCT 38.4* 36.5* 34.1* 31.7*  MCV 87.9 83.1 85.3 83.9  PLT 333 248 188 778   Basic Metabolic Panel: Recent Labs  Lab 04/05/21 2336 04/06/21 0703 04/07/21 0833 04/08/21 0652 04/09/21 0635 04/10/21 0621  NA 138 139 139 139 141 138  K 5.5* 3.7 3.1* 2.8* 3.6 3.3*  CL 105 105 103 102 109 108  CO2 26 27 28 28 27 27   GLUCOSE 218* 115* 110* 89 84 86  BUN 31* 32* 25* 19 17 18   CREATININE 1.23 1.09 0.94 0.94 0.69 0.73  CALCIUM 8.3* 8.0* 8.5* 8.6* 8.7* 8.4*  MG 2.2  --   --   --   --   --    GFR: Estimated Creatinine Clearance: 86.7 mL/min (by C-G formula based on SCr of 0.73 mg/dL). Liver Function Tests: Recent Labs  Lab 04/05/21 2336   AST 15  ALT <5  ALKPHOS 55  BILITOT 1.3*  PROT 5.0*  ALBUMIN 3.3*   No results for input(s): LIPASE, AMYLASE in the last 168 hours. No results for input(s): AMMONIA in the last 168 hours. Coagulation Profile: Recent Labs  Lab 04/06/21 0703  INR 1.1   Cardiac Enzymes: No results for input(s): CKTOTAL, CKMB, CKMBINDEX, TROPONINI in the last 168 hours. BNP (last 3 results) No results for input(s): PROBNP in the last 8760 hours. HbA1C: No results for input(s): HGBA1C in the last 72 hours.  CBG: Recent Labs  Lab 04/09/21 1632 04/09/21 2103 04/09/21 2120 04/10/21 0733 04/10/21 1125  GLUCAP 102* 322* 132* 77 135*   Lipid Profile: No results for input(s): CHOL, HDL, LDLCALC, TRIG, CHOLHDL, LDLDIRECT in the last 72 hours. Thyroid Function Tests: No results for input(s): TSH, T4TOTAL, FREET4, T3FREE, THYROIDAB in the last 72 hours. Anemia Panel: No results for input(s): VITAMINB12, FOLATE, FERRITIN, TIBC, IRON, RETICCTPCT in the last 72 hours. Sepsis Labs: Recent Labs  Lab 04/06/21 0143 04/06/21 0635 04/06/21 0703 04/06/21 1044 04/07/21 1643  PROCALCITON  --   --  1.90  --   --   LATICACIDVEN 4.0* 1.6  --  2.7* 0.9    Recent Results (from the past 240 hour(s))  C Difficile Quick Screen w PCR reflex     Status: None   Collection Time: 04/05/21 11:32 PM   Specimen: STOOL  Result Value Ref Range Status   C Diff antigen NEGATIVE NEGATIVE Final   C Diff toxin NEGATIVE NEGATIVE Final   C Diff interpretation No C. difficile detected.  Final    Comment: Performed at [facility], [address] [place], [place] [address]  Resp Panel by RT-PCR (Flu A&B, Covid) Nasopharyngeal Swab     Status: None   Collection Time: 04/05/21 11:58 PM   Specimen: Nasopharyngeal Swab; Nasopharyngeal(NP) swabs in vial transport medium  Result Value Ref Range Status   SARS Coronavirus 2 by RT PCR NEGATIVE NEGATIVE Final    Comment: (NOTE) SARS-CoV-2 target nucleic acids are NOT  DETECTED.  The SARS-CoV-2 RNA is generally detectable in upper respiratory specimens during the acute phase of infection. The lowest concentration of SARS-CoV-2 viral copies this assay can detect is 138 copies/mL. A negative result does not preclude SARS-Cov-2 infection and should not be used as the sole basis for treatment or other patient management decisions. A negative result may occur with  improper specimen collection/handling, submission of specimen other than nasopharyngeal swab, presence of viral mutation(s) within the areas targeted by this assay, and inadequate number of viral copies(<138 copies/mL). A  negative result must be combined with clinical observations, patient history, and epidemiological information. The expected result is Negative.  Fact Sheet for Patients:  EntrepreneurPulse.com.au  Fact Sheet for Healthcare Providers:  IncredibleEmployment.be  This test is no t yet approved or cleared by the [place] FDA and  has been authorized for detection and/or diagnosis of SARS-CoV-2 by FDA under an Emergency Use Authorization (EUA). This EUA will remain  in effect (meaning this test can be used) for the duration of the COVID-19 declaration under Section 564(b)(1) of the Act, 21 U.S.C.section 360bbb-3(b)(1), unless the authorization is terminated  or revoked sooner.       Influenza A by PCR NEGATIVE NEGATIVE Final   Influenza B by PCR NEGATIVE NEGATIVE Final    Comment: (NOTE) The Xpert Xpress SARS-CoV-2/FLU/RSV plus assay is intended as an aid in the diagnosis of influenza from Nasopharyngeal swab specimens and should not be used as a sole basis for treatment. Nasal washings and aspirates are unacceptable for Xpert Xpress SARS-CoV-2/FLU/RSV testing.  Fact Sheet for Patients: EntrepreneurPulse.com.au  Fact Sheet for Healthcare Providers: IncredibleEmployment.be  This test is not yet  approved or cleared by the [place] FDA and has been authorized for detection and/or diagnosis of SARS-CoV-2 by FDA under an Emergency Use Authorization (EUA). This EUA will remain in effect (meaning this test can be used) for the duration of the COVID-19 declaration under Section 564(b)(1) of the Act, 21 U.S.C. section 360bbb-3(b)(1), unless the authorization is terminated or revoked.  Performed at [facility], [address] [place], [place] [address]   Culture, blood (single)     Status: None (Preliminary result)   Collection Time: 04/06/21  1:05 AM   Specimen: BLOOD  Result Value Ref Range Status   Specimen Description BLOOD RIGHT ASSIST CONTROL  Final   Special Requests   Final    BOTTLES DRAWN AEROBIC AND ANAEROBIC Blood Culture adequate volume   Culture   Final    NO GROWTH 3 DAYS Performed at [facility], [address] [place], [place] [address]    Report Status PENDING  Incomplete  MRSA Next Gen by PCR, Nasal     Status: None   Collection Time: 04/06/21  4:02 AM   Specimen: Nasal Mucosa; Nasal Swab  Result Value Ref Range Status   MRSA by PCR Next Gen NOT DETECTED NOT DETECTED Final    Comment: (NOTE) The GeneXpert MRSA Assay (FDA approved for NASAL specimens only), is one component of a comprehensive MRSA colonization surveillance program. It is not intended to diagnose MRSA infection nor to guide or monitor treatment for MRSA infections. Test performance is not FDA approved in patients less than 86 years old. Performed at [facility], [address] [place], [place] [address]   Gastrointestinal Panel by PCR , Stool     Status: None   Collection Time: 04/06/21  3:00 PM   Specimen: Stool  Result Value Ref Range Status   Campylobacter species NOT DETECTED NOT DETECTED Final   Plesimonas shigelloides NOT DETECTED NOT DETECTED Final   Salmonella species NOT DETECTED NOT DETECTED Final   Yersinia enterocolitica NOT DETECTED  NOT DETECTED Final   Vibrio species NOT DETECTED NOT DETECTED Final   Vibrio cholerae NOT DETECTED NOT DETECTED Final   Enteroaggregative E coli (EAEC) NOT DETECTED NOT DETECTED Final   Enteropathogenic E coli (EPEC) NOT DETECTED NOT DETECTED Final   Enterotoxigenic E coli (ETEC) NOT DETECTED NOT DETECTED Final   Shiga like toxin  producing E coli (STEC) NOT DETECTED NOT DETECTED Final   Shigella/Enteroinvasive E coli (EIEC) NOT DETECTED NOT DETECTED Final   Cryptosporidium NOT DETECTED NOT DETECTED Final   Cyclospora cayetanensis NOT DETECTED NOT DETECTED Final   Entamoeba histolytica NOT DETECTED NOT DETECTED Final   Giardia lamblia NOT DETECTED NOT DETECTED Final   Adenovirus F40/41 NOT DETECTED NOT DETECTED Final   Astrovirus NOT DETECTED NOT DETECTED Final   Norovirus GI/GII NOT DETECTED NOT DETECTED Final   Rotavirus A NOT DETECTED NOT DETECTED Final   Sapovirus (I, II, IV, and V) NOT DETECTED NOT DETECTED Final    Comment: Performed at [facility], [address] [place], [place] [address]         Radiology Studies: No results found.      Scheduled Meds:  carbidopa-levodopa  3 tablet Oral QID   divalproex  250 mg Oral BID   DULoxetine  60 mg Oral QHS   enoxaparin (LOVENOX) injection  40 mg Subcutaneous Q24H   gabapentin  300 mg Oral TID   insulin aspart  0-5 Units Subcutaneous QHS   insulin aspart  0-9 Units Subcutaneous TID WC   QUEtiapine  25 mg Oral Daily   QUEtiapine  50 mg Oral QHS   tamsulosin  0.4 mg Oral Daily   Continuous Infusions:  ceFEPime (MAXIPIME) IV 2 g (04/10/21 0559)   metronidazole 500 mg (04/10/21 0802)     LOS: 4 days    Time spent: 30 mins     Wyvonnia Dusky, MD [facility] Pager [phone]  If 7PM-7AM, please contact night-coverage 04/10/2021, 12:48 PM

## 2021-04-10 NOTE — Progress Notes (Signed)
Pharmacy Antibiotic Note  Kevin Bonilla is a 70 y.o. male w/ PMH of Parkinson's disease, nonverbal at baseline, COPD, HTN, DM admitted on 04/05/2021 with severe diarrhea related to colitis.  Pharmacy has been consulted for cefepime dosing.   Plan:  Continue cefepime 2 grams IV every 8 hours Follow renal function for needed dose adjustments  Height: 5\' 8"  (172.7 cm) Weight: 82 kg (180 lb 12.4 oz) IBW/kg (Calculated) : 68.4  Temp (24hrs), Avg:97.8 F (36.6 C), Min:97.5 F (36.4 C), Max:98.6 F (37 C)  Recent Labs  Lab 04/05/21 2328 04/05/21 2336 04/05/21 2336 04/06/21 0143 04/06/21 0635 04/06/21 0703 04/06/21 1044 04/07/21 0833 04/07/21 1643 04/08/21 0652 04/09/21 0635 04/10/21 0621  WBC  --  16.1*  --   --   --  10.4  --   --   --   --  5.6 7.3  CREATININE  --  1.23   < >  --   --  1.09  --  0.94  --  0.94 0.69 0.73  LATICACIDVEN 2.8*  --   --  4.0* 1.6  --  2.7*  --  0.9  --   --   --    < > = values in this interval not displayed.     Estimated Creatinine Clearance: 86.7 mL/min (by C-G formula based on SCr of 0.73 mg/dL).    Allergies  Allergen Reactions   Lisinopril Nausea Only    Panic attacks   Lisinopril Nausea Only and Other (See Comments)    Panic attacks   Buspirone Palpitations    Per cardiology, low HR    Antimicrobials this admission: cefepime 07/11 >>  metronidazole 07/11 >>   Microbiology results: 07/11 C diff: negative  07/11 BCx: NGTD  07/10 C diff: negative 07/10 SARS CoV-2: negative 07/10 influenza A/B: negative  Thank you for allowing pharmacy to be a part of this patient's care.  09/10 Karen Huhta 04/10/2021 7:10 AM

## 2021-04-11 DIAGNOSIS — L8990 Pressure ulcer of unspecified site, unspecified stage: Secondary | ICD-10-CM | POA: Insufficient documentation

## 2021-04-11 LAB — CULTURE, BLOOD (SINGLE)
Culture: NO GROWTH
Special Requests: ADEQUATE

## 2021-04-11 LAB — CBC
HCT: 31.9 % — ABNORMAL LOW (ref 39.0–52.0)
Hemoglobin: 10.3 g/dL — ABNORMAL LOW (ref 13.0–17.0)
MCH: 27.5 pg (ref 26.0–34.0)
MCHC: 32.3 g/dL (ref 30.0–36.0)
MCV: 85.3 fL (ref 80.0–100.0)
Platelets: 198 10*3/uL (ref 150–400)
RBC: 3.74 MIL/uL — ABNORMAL LOW (ref 4.22–5.81)
RDW: 15.2 % (ref 11.5–15.5)
WBC: 6.7 10*3/uL (ref 4.0–10.5)
nRBC: 0 % (ref 0.0–0.2)

## 2021-04-11 LAB — BASIC METABOLIC PANEL
Anion gap: 6 (ref 5–15)
BUN: 19 mg/dL (ref 8–23)
CO2: 24 mmol/L (ref 22–32)
Calcium: 8.4 mg/dL — ABNORMAL LOW (ref 8.9–10.3)
Chloride: 107 mmol/L (ref 98–111)
Creatinine, Ser: 0.79 mg/dL (ref 0.61–1.24)
GFR, Estimated: >60 mL/min (ref >60)
Glucose, Bld: 92 mg/dL (ref 70–99)
Potassium: 3.8 mmol/L (ref 3.5–5.1)
Sodium: 137 mmol/L (ref 135–145)

## 2021-04-11 LAB — GLUCOSE, CAPILLARY
Glucose-Capillary: 91 mg/dL (ref 70–99)
Glucose-Capillary: 119 mg/dL — ABNORMAL HIGH (ref 70–99)
Glucose-Capillary: 119 mg/dL — ABNORMAL HIGH (ref 70–99)
Glucose-Capillary: 126 mg/dL — ABNORMAL HIGH (ref 70–99)

## 2021-04-11 NOTE — Progress Notes (Signed)
PROGRESS NOTE    Kevin Bonilla  YDX:[id number] DOB: December 13, 1950 DOA: 04/05/2021 PCP: Tracie Harrier, MD   Assessment & Plan:   Principal Problem:   Severe sepsis ([place]) Active Problems:   Essential (primary) hypertension   Seizure-like activity ([place])   Diabetes mellitus type 2, uncomplicated ([place])   Parkinson's disease ([place])   Acute colitis   Compression fracture of T8 vertebra ([place])   Hypotension   COPD (chronic obstructive pulmonary disease) (HCC)   Severe sepsis: met criteria w/ leukocytosis, elevated lactic acid, hypotension & acute colitis. GI PCR panel was neg. Completed abx course. D/c IVFs. Continue on abxs for 3-5 days as per GI. Severe sepsis resolved  Acute colitis: completed abx course. GI recs apprec  Hypotension: resolved   Hypokalemia: WNL today   Unlikely seizure-like activity: neurology was consulted, via chat Dr. Rory Percy did not feel this was seizure after reviewing pts chart as per admitting physician, Dr. Damita Dunnings   DM2: well controlled. Continue on SSI w/ accuchecks    Parkinson's disease: continue on home dose of sinemet    Compression fracture of T8 vertebra : new finding but appears chronic as per CT scan    COPD: w/o exacerbation. Continue on bronchodilators    DVT prophylaxis: lovenox  Code Status: full  Family Communication:  Disposition Plan:  will go back home to [facility] when [employer] accepts him back which will be Monday as per CM   Level of care: Progressive Cardiac  Status is: Inpatient  Remains inpatient appropriate because:IV treatments appropriate due to intensity of illness or inability to take PO and Inpatient level of care appropriate due to severity of illness  Dispo: The patient is from: Home ([facility])               Anticipated d/c is to: Home ([facility])              Patient currently is currently medically stable for d/c   Difficult to place patient : unclear        Consultants:   GI  Procedures:   Antimicrobials:   Subjective: Pt is nonverbal baseline. Pt seems comfortable   Objective: Vitals:   04/10/21 1607 04/10/21 1927 04/11/21 0514 04/11/21 0724  BP: 113/64 124/64 (!) 148/79 (!) 150/69  Pulse: 77 74 65 60  Resp: 16 14 15 16   Temp: 98.5 F (36.9 C) 98.2 F (36.8 C) 97.8 F (36.6 C) 98.4 F (36.9 C)  TempSrc:      SpO2: 99% 98% 97% 98%  Weight:      Height:        Intake/Output Summary (Last 24 hours) at 04/11/2021 0810 Last data filed at 04/10/2021 2000 Gross per 24 hour  Intake 240 ml  Output --  Net 240 ml   Filed Weights   04/05/21 2325  Weight: 82 kg    Examination:  General exam: Appears calm and comfortable  Respiratory system: Clear breath sounds b/l  Cardiovascular system: S1/S2+. Systolic murmur  Gastrointestinal system: Abd is soft, NT, ND & normal bowel sounds  Central nervous system: Alert and awake. Moves all extremities  Psychiatry: Judgement and insight appear abnormal. Flat mood and affect    Data Reviewed: I have personally reviewed following labs and imaging studies  CBC: Recent Labs  Lab 04/05/21 2336 04/06/21 0703 04/09/21 0635 04/10/21 0621 04/11/21 0631  WBC 16.1* 10.4 5.6 7.3 6.7  NEUTROABS 14.4*  --   --   --   --  HGB 11.7* 11.8* 10.6* 10.1* 10.3*  HCT 38.4* 36.5* 34.1* 31.7* 31.9*  MCV 87.9 83.1 85.3 83.9 85.3  PLT 333 248 188 200 268   Basic Metabolic Panel: Recent Labs  Lab 04/05/21 2336 04/06/21 0703 04/07/21 0833 04/08/21 0652 04/09/21 0635 04/10/21 0621 04/11/21 0631  NA 138   < > 139 139 141 138 137  K 5.5*   < > 3.1* 2.8* 3.6 3.3* 3.8  CL 105   < > 103 102 109 108 107  CO2 26   < > 28 28 27 27 24   GLUCOSE 218*   < > 110* 89 84 86 92  BUN 31*   < > 25* 19 17 18 19   CREATININE 1.23   < > 0.94 0.94 0.69 0.73 0.79  CALCIUM 8.3*   < > 8.5* 8.6* 8.7* 8.4* 8.4*  MG 2.2  --   --   --   --   --   --    < > = values in this interval not displayed.   GFR: Estimated Creatinine  Clearance: 86.7 mL/min (by C-G formula based on SCr of 0.79 mg/dL). Liver Function Tests: Recent Labs  Lab 04/05/21 2336  AST 15  ALT <5  ALKPHOS 55  BILITOT 1.3*  PROT 5.0*  ALBUMIN 3.3*   No results for input(s): LIPASE, AMYLASE in the last 168 hours. No results for input(s): AMMONIA in the last 168 hours. Coagulation Profile: Recent Labs  Lab 04/06/21 0703  INR 1.1   Cardiac Enzymes: No results for input(s): CKTOTAL, CKMB, CKMBINDEX, TROPONINI in the last 168 hours. BNP (last 3 results) No results for input(s): PROBNP in the last 8760 hours. HbA1C: No results for input(s): HGBA1C in the last 72 hours.  CBG: Recent Labs  Lab 04/10/21 0733 04/10/21 1125 04/10/21 1607 04/10/21 2150 04/11/21 0725  GLUCAP 77 135* 132* 124* 91   Lipid Profile: No results for input(s): CHOL, HDL, LDLCALC, TRIG, CHOLHDL, LDLDIRECT in the last 72 hours. Thyroid Function Tests: No results for input(s): TSH, T4TOTAL, FREET4, T3FREE, THYROIDAB in the last 72 hours. Anemia Panel: No results for input(s): VITAMINB12, FOLATE, FERRITIN, TIBC, IRON, RETICCTPCT in the last 72 hours. Sepsis Labs: Recent Labs  Lab 04/06/21 0143 04/06/21 0635 04/06/21 0703 04/06/21 1044 04/07/21 1643  PROCALCITON  --   --  1.90  --   --   LATICACIDVEN 4.0* 1.6  --  2.7* 0.9    Recent Results (from the past 240 hour(s))  C Difficile Quick Screen w PCR reflex     Status: None   Collection Time: 04/05/21 11:32 PM   Specimen: STOOL  Result Value Ref Range Status   C Diff antigen NEGATIVE NEGATIVE Final   C Diff toxin NEGATIVE NEGATIVE Final   C Diff interpretation No C. difficile detected.  Final    Comment: Performed at [facility], [address] [place], [place] [address]  Resp Panel by RT-PCR (Flu A&B, Covid) Nasopharyngeal Swab     Status: None   Collection Time: 04/05/21 11:58 PM   Specimen: Nasopharyngeal Swab; Nasopharyngeal(NP) swabs in vial transport medium  Result Value Ref Range  Status   SARS Coronavirus 2 by RT PCR NEGATIVE NEGATIVE Final    Comment: (NOTE) SARS-CoV-2 target nucleic acids are NOT DETECTED.  The SARS-CoV-2 RNA is generally detectable in upper respiratory specimens during the acute phase of infection. The lowest concentration of SARS-CoV-2 viral copies this assay can detect is 138 copies/mL. A negative result does not preclude  SARS-Cov-2 infection and should not be used as the sole basis for treatment or other patient management decisions. A negative result may occur with  improper specimen collection/handling, submission of specimen other than nasopharyngeal swab, presence of viral mutation(s) within the areas targeted by this assay, and inadequate number of viral copies(<138 copies/mL). A negative result must be combined with clinical observations, patient history, and epidemiological information. The expected result is Negative.  Fact Sheet for Patients:  EntrepreneurPulse.com.au  Fact Sheet for Healthcare Providers:  IncredibleEmployment.be  This test is no t yet approved or cleared by the [place] FDA and  has been authorized for detection and/or diagnosis of SARS-CoV-2 by FDA under an Emergency Use Authorization (EUA). This EUA will remain  in effect (meaning this test can be used) for the duration of the COVID-19 declaration under Section 564(b)(1) of the Act, 21 U.S.C.section 360bbb-3(b)(1), unless the authorization is terminated  or revoked sooner.       Influenza A by PCR NEGATIVE NEGATIVE Final   Influenza B by PCR NEGATIVE NEGATIVE Final    Comment: (NOTE) The Xpert Xpress SARS-CoV-2/FLU/RSV plus assay is intended as an aid in the diagnosis of influenza from Nasopharyngeal swab specimens and should not be used as a sole basis for treatment. Nasal washings and aspirates are unacceptable for Xpert Xpress SARS-CoV-2/FLU/RSV testing.  Fact Sheet for  Patients: EntrepreneurPulse.com.au  Fact Sheet for Healthcare Providers: IncredibleEmployment.be  This test is not yet approved or cleared by the [place] FDA and has been authorized for detection and/or diagnosis of SARS-CoV-2 by FDA under an Emergency Use Authorization (EUA). This EUA will remain in effect (meaning this test can be used) for the duration of the COVID-19 declaration under Section 564(b)(1) of the Act, 21 U.S.C. section 360bbb-3(b)(1), unless the authorization is terminated or revoked.  Performed at [facility], [address] [place], [place] [address]   Culture, blood (single)     Status: None   Collection Time: 04/06/21  1:05 AM   Specimen: BLOOD  Result Value Ref Range Status   Specimen Description BLOOD RIGHT ASSIST CONTROL  Final   Special Requests   Final    BOTTLES DRAWN AEROBIC AND ANAEROBIC Blood Culture adequate volume   Culture   Final    NO GROWTH 5 DAYS Performed at [facility], [address] [place], [place] [address]    Report Status 04/11/2021 FINAL  Final  MRSA Next Gen by PCR, Nasal     Status: None   Collection Time: 04/06/21  4:02 AM   Specimen: Nasal Mucosa; Nasal Swab  Result Value Ref Range Status   MRSA by PCR Next Gen NOT DETECTED NOT DETECTED Final    Comment: (NOTE) The GeneXpert MRSA Assay (FDA approved for NASAL specimens only), is one component of a comprehensive MRSA colonization surveillance program. It is not intended to diagnose MRSA infection nor to guide or monitor treatment for MRSA infections. Test performance is not FDA approved in patients less than 39 years old. Performed at [facility], [address] [place], [place] [address]   Gastrointestinal Panel by PCR , Stool     Status: None   Collection Time: 04/06/21  3:00 PM   Specimen: Stool  Result Value Ref Range Status   Campylobacter species NOT DETECTED NOT DETECTED Final    Plesimonas shigelloides NOT DETECTED NOT DETECTED Final   Salmonella species NOT DETECTED NOT DETECTED Final   Yersinia enterocolitica NOT DETECTED NOT DETECTED Final   Vibrio  species NOT DETECTED NOT DETECTED Final   Vibrio cholerae NOT DETECTED NOT DETECTED Final   Enteroaggregative E coli (EAEC) NOT DETECTED NOT DETECTED Final   Enteropathogenic E coli (EPEC) NOT DETECTED NOT DETECTED Final   Enterotoxigenic E coli (ETEC) NOT DETECTED NOT DETECTED Final   Shiga like toxin producing E coli (STEC) NOT DETECTED NOT DETECTED Final   Shigella/Enteroinvasive E coli (EIEC) NOT DETECTED NOT DETECTED Final   Cryptosporidium NOT DETECTED NOT DETECTED Final   Cyclospora cayetanensis NOT DETECTED NOT DETECTED Final   Entamoeba histolytica NOT DETECTED NOT DETECTED Final   Giardia lamblia NOT DETECTED NOT DETECTED Final   Adenovirus F40/41 NOT DETECTED NOT DETECTED Final   Astrovirus NOT DETECTED NOT DETECTED Final   Norovirus GI/GII NOT DETECTED NOT DETECTED Final   Rotavirus A NOT DETECTED NOT DETECTED Final   Sapovirus (I, II, IV, and V) NOT DETECTED NOT DETECTED Final    Comment: Performed at [facility], [address] [place], [place] [address]         Radiology Studies: No results found.      Scheduled Meds:  carbidopa-levodopa  3 tablet Oral QID   divalproex  250 mg Oral BID   DULoxetine  60 mg Oral QHS   enoxaparin (LOVENOX) injection  40 mg Subcutaneous Q24H   gabapentin  300 mg Oral TID   insulin aspart  0-5 Units Subcutaneous QHS   insulin aspart  0-9 Units Subcutaneous TID WC   QUEtiapine  25 mg Oral Daily   QUEtiapine  50 mg Oral QHS   tamsulosin  0.4 mg Oral Daily   Continuous Infusions:     LOS: 5 days    Time spent: 15 mins     Wyvonnia Dusky, MD [facility] Pager [phone]  If 7PM-7AM, please contact night-coverage 04/11/2021, 8:10 AM

## 2021-04-12 LAB — BASIC METABOLIC PANEL
Anion gap: 5 (ref 5–15)
BUN: 22 mg/dL (ref 8–23)
CO2: 28 mmol/L (ref 22–32)
Calcium: 8.5 mg/dL — ABNORMAL LOW (ref 8.9–10.3)
Chloride: 104 mmol/L (ref 98–111)
Creatinine, Ser: 0.71 mg/dL (ref 0.61–1.24)
GFR, Estimated: >60 mL/min (ref >60)
Glucose, Bld: 98 mg/dL (ref 70–99)
Potassium: 4 mmol/L (ref 3.5–5.1)
Sodium: 137 mmol/L (ref 135–145)

## 2021-04-12 LAB — GLUCOSE, CAPILLARY
Glucose-Capillary: 94 mg/dL (ref 70–99)
Glucose-Capillary: 134 mg/dL — ABNORMAL HIGH (ref 70–99)
Glucose-Capillary: 90 mg/dL (ref 70–99)
Glucose-Capillary: 132 mg/dL — ABNORMAL HIGH (ref 70–99)

## 2021-04-12 LAB — CBC
HCT: 34.2 % — ABNORMAL LOW (ref 39.0–52.0)
Hemoglobin: 11 g/dL — ABNORMAL LOW (ref 13.0–17.0)
MCH: 27.2 pg (ref 26.0–34.0)
MCHC: 32.2 g/dL (ref 30.0–36.0)
MCV: 84.7 fL (ref 80.0–100.0)
Platelets: 223 10*3/uL (ref 150–400)
RBC: 4.04 MIL/uL — ABNORMAL LOW (ref 4.22–5.81)
RDW: 15.6 % — ABNORMAL HIGH (ref 11.5–15.5)
WBC: 12.8 10*3/uL — ABNORMAL HIGH (ref 4.0–10.5)
nRBC: 0 % (ref 0.0–0.2)

## 2021-04-12 NOTE — Progress Notes (Signed)
PROGRESS NOTE    Kevin Bonilla  WUJ:[id number] DOB: [birth year] DOA: 04/05/2021 PCP: Tracie Harrier, MD   Assessment & Plan:   Principal Problem:   Severe sepsis ([place]) Active Problems:   Essential (primary) hypertension   Seizure-like activity ([place])   Diabetes mellitus type 2, uncomplicated ([place])   Parkinson's disease ([place])   Acute colitis   Compression fracture of T8 vertebra (HCC)   Hypotension   COPD (chronic obstructive pulmonary disease) (HCC)   Pressure injury of skin   Severe sepsis: met criteria w/ leukocytosis, elevated lactic acid, hypotension & acute colitis. GI PCR panel was neg. Completed abx course. D/c IVFs. Continue on abxs for 3-5 days as per GI. Severe sepsis resolved  Acute colitis: completed abx course. GI recs apprec  Hypotension: resolved   Hypokalemia:within normal limits today   Unlikely seizure-like activity: neurology was consulted, via chat Dr. Rory Percy did not feel this was seizure after reviewing pts chart as per admitting physician, Dr. Damita Dunnings   DM2: well controlled. Continue on SSI w/ accuchecks   Parkinson's disease: continue on home dose of carbidopa-levodopa    Compression fracture of T8 vertebra: new finding but appears chronic as per CT scan    COPD: w/o exacerbation. Continue on bronchodilators   DVT prophylaxis: lovenox  Code Status: full  Family Communication:  Disposition Plan:  will go back home to [facility] when [facility] accepts him back which will be Monday as per CM   Level of care: Progressive Cardiac  Status is: Inpatient  Remains inpatient appropriate because:IV treatments appropriate due to intensity of illness or inability to take PO and Inpatient level of care appropriate due to severity of illness  Dispo: The patient is from: Home ([facility])               Anticipated d/c is to: Home ([facility])              Patient currently is currently medically stable for d/c   Difficult to  place patient : unclear        Consultants:  GI  Procedures:   Antimicrobials:   Subjective: Pt appears comfortable. Nonverbal at baseline but shake his head yes and no to questions  Objective: Vitals:   04/11/21 1642 04/11/21 1931 04/12/21 0436 04/12/21 0742  BP: 116/64 132/67 131/65 124/69  Pulse: 75 81 73 85  Resp: _0 Temp: 97.9 F (36.6 C) 98.6 F (37 C) 98.1 F (36.7 C) 99.8 F (37.7 C)  TempSrc:      SpO2: 98% 96% 96% 94%  Weight:      Height:        Intake/Output Summary (Last 24 hours) at 04/12/2021 9562 Last data filed at 04/12/2021 0436 Gross per 24 hour  Intake 1540 ml  Output 800 ml  Net 740 ml   Filed Weights   04/05/21 2325  Weight: 82 kg    Examination:  General exam: Appears comfortable  Respiratory system: clear breath sounds b/l  Cardiovascular system: S1 & S2+. Systolic murmur  Gastrointestinal system: Abd is soft, NT, ND & hypoactive bowel sounds Central nervous system: Alert and awake. Moves all extremities  Psychiatry: Judgement and insight appear abnormal. Flat mood and affect    Data Reviewed: I have personally reviewed following labs and imaging studies  CBC: Recent Labs  Lab 04/05/21 2336 04/06/21 0703 04/09/21 0635 04/10/21 0621 04/11/21 0631 04/12/21 0457  WBC 16.1* 10.4 5.6 7.3 6.7 12.8*  NEUTROABS 14.4*  --   --   --   --   --  HGB 11.7* 11.8* 10.6* 10.1* 10.3* 11.0*  HCT 38.4* 36.5* 34.1* 31.7* 31.9* 34.2*  MCV 87.9 83.1 85.3 83.9 85.3 84.7  PLT 333 248 188 200 198 016   Basic Metabolic Panel: Recent Labs  Lab 04/05/21 2336 04/06/21 0703 04/08/21 0652 04/09/21 0635 04/10/21 0621 04/11/21 0631 04/12/21 0457  NA 138   < > 139 141 138 137 137  K 5.5*   < > 2.8* 3.6 3.3* 3.8 4.0  CL 105   < > 102 109 108 107 104  CO2 26   < > _0 GLUCOSE 218*   < > 89 84 86 92 98  BUN 31*   < > _1 CREATININE 1.23   < > 0.94 0.69 0.73 0.79 0.71  CALCIUM 8.3*   < > 8.6* 8.7* 8.4*  8.4* 8.5*  MG 2.2  --   --   --   --   --   --    < > = values in this interval not displayed.   GFR: Estimated Creatinine Clearance: 86.7 mL/min (by C-G formula based on SCr of 0.71 mg/dL). Liver Function Tests: Recent Labs  Lab 04/05/21 2336  AST 15  ALT <5  ALKPHOS 55  BILITOT 1.3*  PROT 5.0*  ALBUMIN 3.3*   No results for input(s): LIPASE, AMYLASE in the last 168 hours. No results for input(s): AMMONIA in the last 168 hours. Coagulation Profile: Recent Labs  Lab 04/06/21 0703  INR 1.1   Cardiac Enzymes: No results for input(s): CKTOTAL, CKMB, CKMBINDEX, TROPONINI in the last 168 hours. BNP (last 3 results) No results for input(s): PROBNP in the last 8760 hours. HbA1C: No results for input(s): HGBA1C in the last 72 hours.  CBG: Recent Labs  Lab 04/11/21 0725 04/11/21 1117 04/11/21 1642 04/11/21 2011 04/12/21 0744  GLUCAP 91 119* 119* 126* 94   Lipid Profile: No results for input(s): CHOL, HDL, LDLCALC, TRIG, CHOLHDL, LDLDIRECT in the last 72 hours. Thyroid Function Tests: No results for input(s): TSH, T4TOTAL, FREET4, T3FREE, THYROIDAB in the last 72 hours. Anemia Panel: No results for input(s): VITAMINB12, FOLATE, FERRITIN, TIBC, IRON, RETICCTPCT in the last 72 hours. Sepsis Labs: Recent Labs  Lab 04/06/21 0143 04/06/21 0635 04/06/21 0703 04/06/21 1044 04/07/21 1643  PROCALCITON  --   --  1.90  --   --   LATICACIDVEN 4.0* 1.6  --  2.7* 0.9    Recent Results (from the past 240 hour(s))  C Difficile Quick Screen w PCR reflex     Status: None   Collection Time: 04/05/21 11:32 PM   Specimen: STOOL  Result Value Ref Range Status   C Diff antigen NEGATIVE NEGATIVE Final   C Diff toxin NEGATIVE NEGATIVE Final   C Diff interpretation No C. difficile detected.  Final    Comment: Performed at [facility], [address] [place], [place] [address]  Resp Panel by RT-PCR (Flu A&B, Covid) Nasopharyngeal Swab     Status: None   Collection Time:  04/05/21 11:58 PM   Specimen: Nasopharyngeal Swab; Nasopharyngeal(NP) swabs in vial transport medium  Result Value Ref Range Status   SARS Coronavirus 2 by RT PCR NEGATIVE NEGATIVE Final    Comment: (NOTE) SARS-CoV-2 target nucleic acids are NOT DETECTED.  The SARS-CoV-2 RNA is generally detectable in upper respiratory specimens during the acute phase of infection. The lowest concentration of SARS-CoV-2 viral copies this assay can detect is 138 copies/mL. A negative  result does not preclude SARS-Cov-2 infection and should not be used as the sole basis for treatment or other patient management decisions. A negative result may occur with  improper specimen collection/handling, submission of specimen other than nasopharyngeal swab, presence of viral mutation(s) within the areas targeted by this assay, and inadequate number of viral copies(<138 copies/mL). A negative result must be combined with clinical observations, patient history, and epidemiological information. The expected result is Negative.  Fact Sheet for Patients:  EntrepreneurPulse.com.au  Fact Sheet for Healthcare Providers:  IncredibleEmployment.be  This test is no t yet approved or cleared by the [place] FDA and  has been authorized for detection and/or diagnosis of SARS-CoV-2 by FDA under an Emergency Use Authorization (EUA). This EUA will remain  in effect (meaning this test can be used) for the duration of the COVID-19 declaration under Section 564(b)(1) of the Act, 21 U.S.C.section 360bbb-3(b)(1), unless the authorization is terminated  or revoked sooner.       Influenza A by PCR NEGATIVE NEGATIVE Final   Influenza B by PCR NEGATIVE NEGATIVE Final    Comment: (NOTE) The Xpert Xpress SARS-CoV-2/FLU/RSV plus assay is intended as an aid in the diagnosis of influenza from Nasopharyngeal swab specimens and should not be used as a sole basis for treatment. Nasal washings  and aspirates are unacceptable for Xpert Xpress SARS-CoV-2/FLU/RSV testing.  Fact Sheet for Patients: EntrepreneurPulse.com.au  Fact Sheet for Healthcare Providers: IncredibleEmployment.be  This test is not yet approved or cleared by the [place] FDA and has been authorized for detection and/or diagnosis of SARS-CoV-2 by FDA under an Emergency Use Authorization (EUA). This EUA will remain in effect (meaning this test can be used) for the duration of the COVID-19 declaration under Section 564(b)(1) of the Act, 21 U.S.C. section 360bbb-3(b)(1), unless the authorization is terminated or revoked.  Performed at [facility], [address] [place], [place] [address]   Culture, blood (single)     Status: None   Collection Time: 04/06/21  1:05 AM   Specimen: BLOOD  Result Value Ref Range Status   Specimen Description BLOOD RIGHT ASSIST CONTROL  Final   Special Requests   Final    BOTTLES DRAWN AEROBIC AND ANAEROBIC Blood Culture adequate volume   Culture   Final    NO GROWTH 5 DAYS Performed at [facility], [address], [place], [place] [address]    Report Status 04/11/2021 FINAL  Final  MRSA Next Gen by PCR, Nasal     Status: None   Collection Time: 04/06/21  4:02 AM   Specimen: Nasal Mucosa; Nasal Swab  Result Value Ref Range Status   MRSA by PCR Next Gen NOT DETECTED NOT DETECTED Final    Comment: (NOTE) The GeneXpert MRSA Assay (FDA approved for NASAL specimens only), is one component of a comprehensive MRSA colonization surveillance program. It is not intended to diagnose MRSA infection nor to guide or monitor treatment for MRSA infections. Test performance is not FDA approved in patients less than 15 years old. Performed at [facility], [address] [place], [place] [address]   Gastrointestinal Panel by PCR , Stool     Status: None   Collection Time: 04/06/21  3:00 PM   Specimen: Stool   Result Value Ref Range Status   Campylobacter species NOT DETECTED NOT DETECTED Final   Plesimonas shigelloides NOT DETECTED NOT DETECTED Final   Salmonella species NOT DETECTED NOT DETECTED Final   Yersinia enterocolitica NOT DETECTED NOT DETECTED  Final   Vibrio species NOT DETECTED NOT DETECTED Final   Vibrio cholerae NOT DETECTED NOT DETECTED Final   Enteroaggregative E coli (EAEC) NOT DETECTED NOT DETECTED Final   Enteropathogenic E coli (EPEC) NOT DETECTED NOT DETECTED Final   Enterotoxigenic E coli (ETEC) NOT DETECTED NOT DETECTED Final   Shiga like toxin producing E coli (STEC) NOT DETECTED NOT DETECTED Final   Shigella/Enteroinvasive E coli (EIEC) NOT DETECTED NOT DETECTED Final   Cryptosporidium NOT DETECTED NOT DETECTED Final   Cyclospora cayetanensis NOT DETECTED NOT DETECTED Final   Entamoeba histolytica NOT DETECTED NOT DETECTED Final   Giardia lamblia NOT DETECTED NOT DETECTED Final   Adenovirus F40/41 NOT DETECTED NOT DETECTED Final   Astrovirus NOT DETECTED NOT DETECTED Final   Norovirus GI/GII NOT DETECTED NOT DETECTED Final   Rotavirus A NOT DETECTED NOT DETECTED Final   Sapovirus (I, II, IV, and V) NOT DETECTED NOT DETECTED Final    Comment: Performed at [facility], [address] [place], [place] [address]         Radiology Studies: No results found.      Scheduled Meds:  carbidopa-levodopa  3 tablet Oral QID   divalproex  250 mg Oral BID   DULoxetine  60 mg Oral QHS   enoxaparin (LOVENOX) injection  40 mg Subcutaneous Q24H   gabapentin  300 mg Oral TID   insulin aspart  0-5 Units Subcutaneous QHS   insulin aspart  0-9 Units Subcutaneous TID WC   QUEtiapine  25 mg Oral Daily   QUEtiapine  50 mg Oral QHS   tamsulosin  0.4 mg Oral Daily   Continuous Infusions:     LOS: 6 days    Time spent: 15 mins     Wyvonnia Dusky, MD [facility] Pager [phone]  If 7PM-7AM, please contact night-coverage 04/12/2021, 8:22  AM

## 2021-04-12 NOTE — TOC Progression Note (Signed)
Transition of Care [facility]) - Progression Note    Patient Details  Name: Kevin Bonilla MRN: [id number] Date of Birth: [birth year]  [facility] CM/SW Contact  Gildardo Griffes, [place] Phone Number: 04/12/2021, 8:23 AM  Clinical Narrative:     CSW spoke with Verlon Au at [facility] who reports due to staffing they are not accepting weekend admissions. Patient can dc to [facility] per facility.   Expected Discharge Plan: Skilled Nursing Facility Barriers to Discharge: Continued Medical Work up  Expected Discharge Plan and Services Expected Discharge Plan: Skilled Nursing Facility       Living arrangements for the past 2 months: Skilled Nursing Facility [facility]) Expected Discharge Date: 04/10/21                                     Social Determinants of Health (SDOH) Interventions    Readmission Risk Interventions No flowsheet data found.

## 2021-04-13 LAB — BASIC METABOLIC PANEL
Anion gap: 7 (ref 5–15)
BUN: 22 mg/dL (ref 8–23)
CO2: 28 mmol/L (ref 22–32)
Calcium: 8.6 mg/dL — ABNORMAL LOW (ref 8.9–10.3)
Chloride: 102 mmol/L (ref 98–111)
Creatinine, Ser: 0.72 mg/dL (ref 0.61–1.24)
GFR, Estimated: >60 mL/min (ref >60)
Glucose, Bld: 95 mg/dL (ref 70–99)
Potassium: 4 mmol/L (ref 3.5–5.1)
Sodium: 137 mmol/L (ref 135–145)

## 2021-04-13 LAB — CBC
HCT: 32.6 % — ABNORMAL LOW (ref 39.0–52.0)
Hemoglobin: 10.1 g/dL — ABNORMAL LOW (ref 13.0–17.0)
MCH: 27 pg (ref 26.0–34.0)
MCHC: 31 g/dL (ref 30.0–36.0)
MCV: 87.2 fL (ref 80.0–100.0)
Platelets: 225 10*3/uL (ref 150–400)
RBC: 3.74 MIL/uL — ABNORMAL LOW (ref 4.22–5.81)
RDW: 15.9 % — ABNORMAL HIGH (ref 11.5–15.5)
WBC: 9 10*3/uL (ref 4.0–10.5)
nRBC: 0 % (ref 0.0–0.2)

## 2021-04-13 LAB — RESP PANEL BY RT-PCR (FLU A&B, COVID) ARPGX2
Influenza A by PCR: NEGATIVE
Influenza B by PCR: NEGATIVE
SARS Coronavirus 2 by RT PCR: NEGATIVE

## 2021-04-13 LAB — GLUCOSE, CAPILLARY
Glucose-Capillary: 72 mg/dL (ref 70–99)
Glucose-Capillary: 88 mg/dL (ref 70–99)

## 2021-04-13 NOTE — Progress Notes (Signed)
SLP Cancellation Note  Patient Details Name: Kevin Bonilla MRN: [id number] DOB: [birth year]   Cancelled treatment:        SLP attempted visit; in patient care. Spoke with NT who fed pt breakfast this morning, reports he has been tolerating dys 1/thin liquids without difficulty. WBC 9.0, pt afebrile. Has discharge orders signed. Recommend follow-up with SLP at facility.                                                                                              Rondel Baton, [place], CCC-SLP Speech-Language Pathologist    Arlana Lindau 04/13/2021, 10:01 AM

## 2021-04-13 NOTE — Progress Notes (Signed)
No change in previous assessment. Patient alert and nonverbal. Being discharged back to [facility] today. Patient stable for discharge.

## 2021-04-13 NOTE — Care Management Important Message (Signed)
Important Message  Patient Details  Name: Kevin Bonilla MRN: [id number] Date of Birth: 01-21-1951   Medicare Important Message Given:  Yes   Reviewed Medicare IM with Veleta Miners, spouse at [phone].   [name] 04/13/2021, 12:53 PM

## 2021-04-13 NOTE — TOC Transition Note (Signed)
Transition of Care [facility]) - CM/SW Discharge Note   Patient Details  Name: Kevin Bonilla MRN: [id number] Date of Birth: [birth year]  [facility]) CM/SW Contact:  Gildardo Griffes, LCSW Phone Number: 04/13/2021, 9:56 AM   Clinical Narrative:     Patient will DC to: [facility] Anticipated DC date: 04/13/21 Family notified: Marylu Lund (spouse) Transport by: Wendie Simmer  Per MD patient ready for DC to [facility] . RN, patient, patient's family, and facility notified of DC. Discharge Summary sent to facility. RN given number for report  [phone] Room 210a . DC packet on chart.    Will request Ambulance transport pending dc summary and rapid covid test.  CSW signing off.  Angeline Slim, LCSW    Final next level of care: Skilled Nursing Facility Barriers to Discharge: No Barriers Identified   Patient Goals and CMS Choice Patient states their goals for this hospitalization and ongoing recovery are:: to go to SNF CMS Medicare.gov Compare Post Acute Care list provided to:: Patient Represenative (must comment) (spouse Marylu Lund) Choice offered to / list presented to : Spouse  Discharge Placement                Patient to be transferred to facility by: [facility] Name of family member notified: Marylu Lund spouse Patient and family notified of of transfer: 04/13/21  Discharge Plan and Services                                     Social Determinants of Health (SDOH) Interventions     Readmission Risk Interventions No flowsheet data found.

## 2021-08-27 DEATH — deceased

## 2021-12-12 IMAGING — CT CT HEAD W/O CM
3 series · 15 of 47 positions shown, 18 images · non-contrast
Comparison: 11/23/2020 head CT.

CLINICAL DATA: Altered mental status.  No reported injury.

EXAM:
CT HEAD WITHOUT CONTRAST
TECHNIQUE: Contiguous axial images were obtained from the base of the skull
through the vertex without intravenous contrast.

[Series 2: head wo · axial · 0.41mm/px · z∈[+400,+525]mm · 9 of 31 slices shown, 12 images]
[im 3/31  brain]
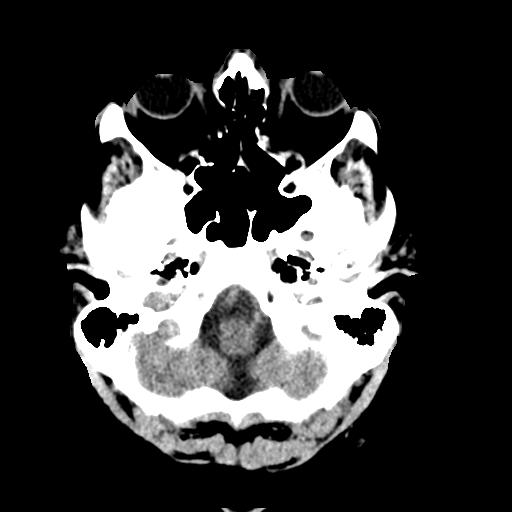
[im 3/31  bone]
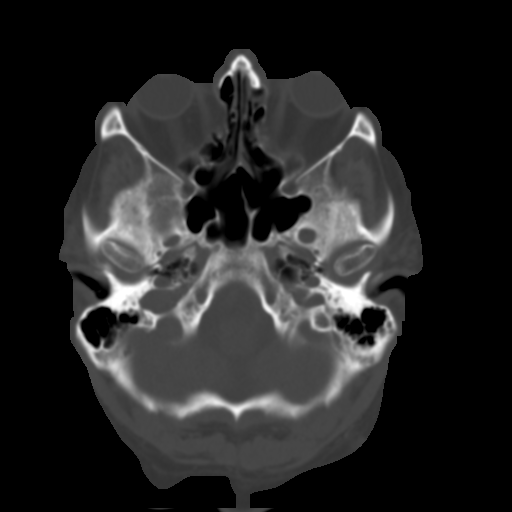
[im 6/31  brain]
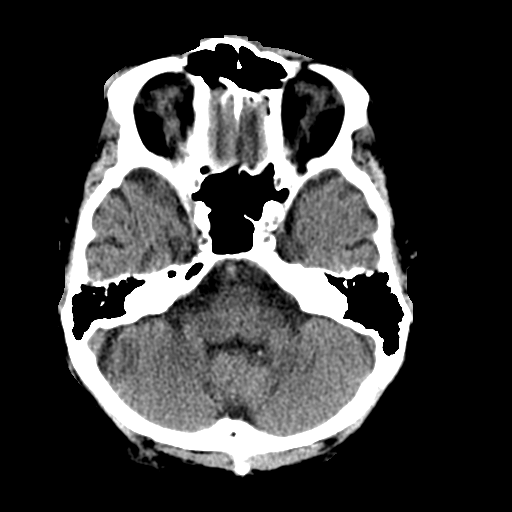
[im 9/31  brain]
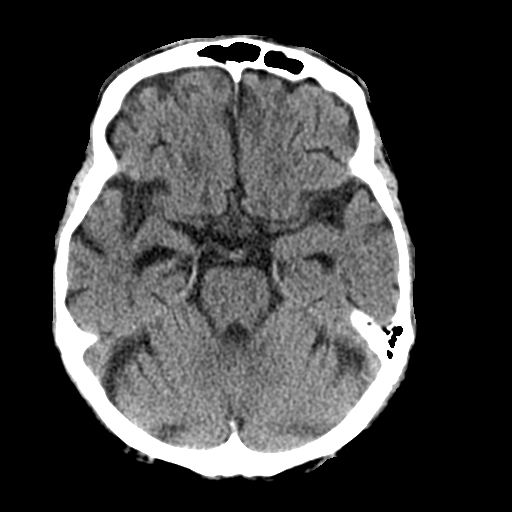
[im 12/31  brain]
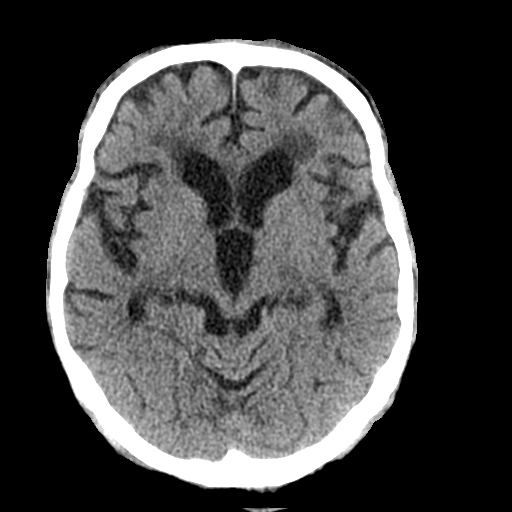
[im 16/31  brain]
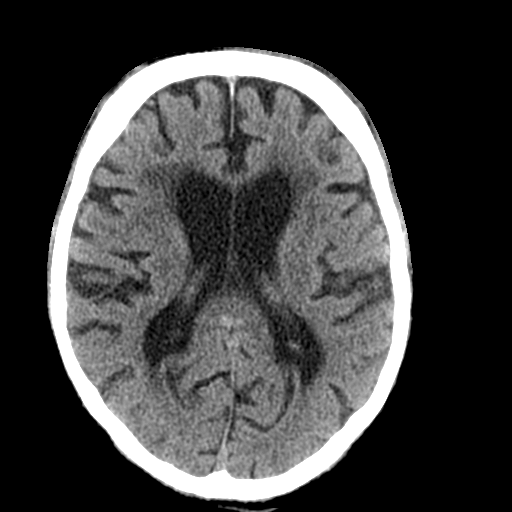
[im 16/31  bone]
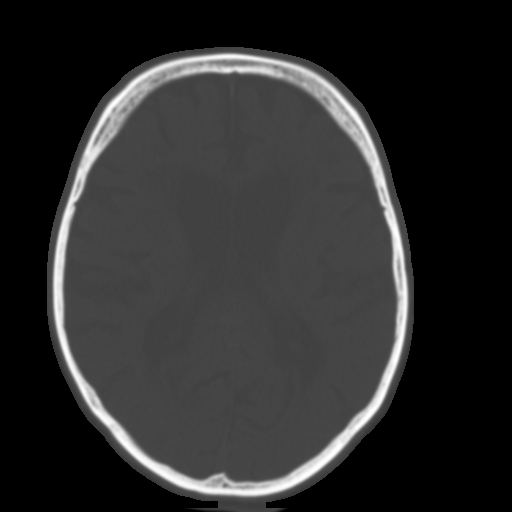
[im 19/31  brain]
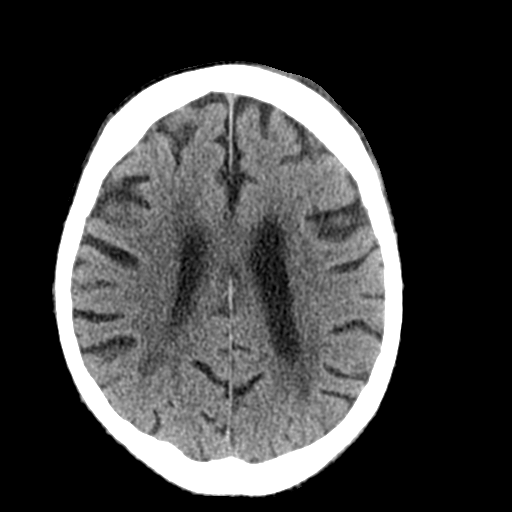
[im 22/31  brain]
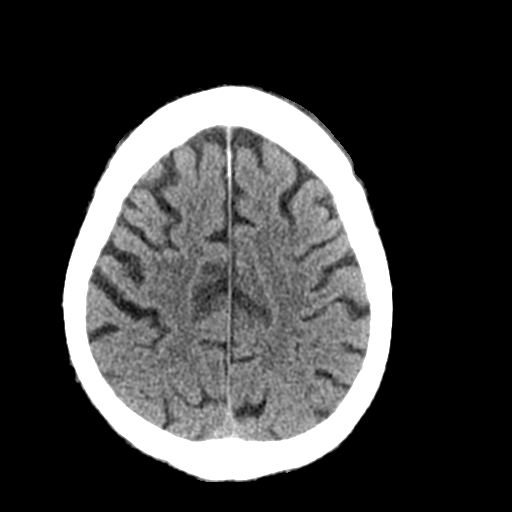
[im 25/31  brain]
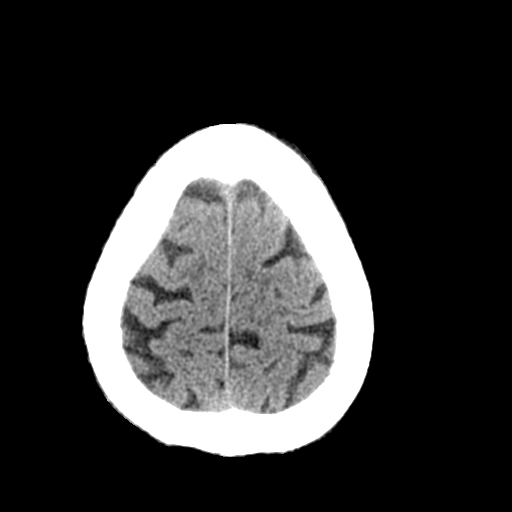
[im 28/31  brain]
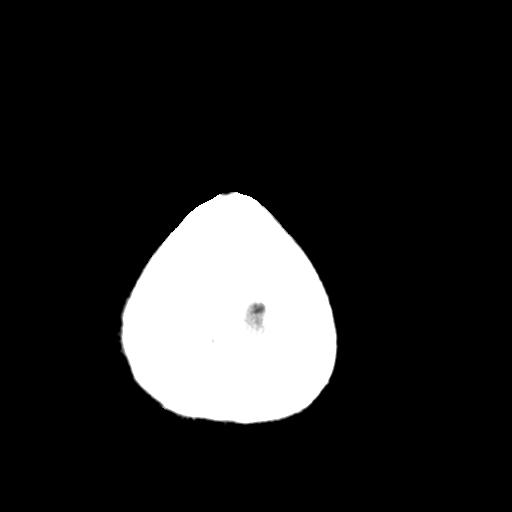
[im 28/31  bone]
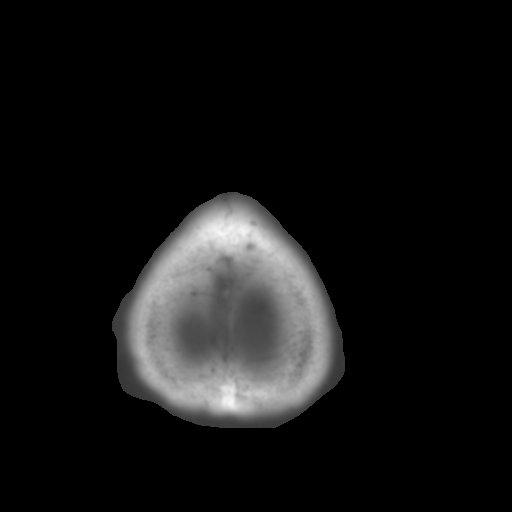

[Series 4: coronal soft tissue · coronal · 0.33mm/px · 3 of 66 slices shown]
[im 22/66  brain]
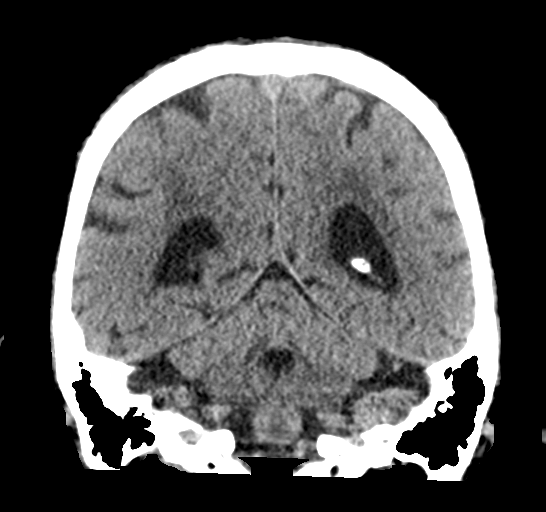
[im 29/66  brain]
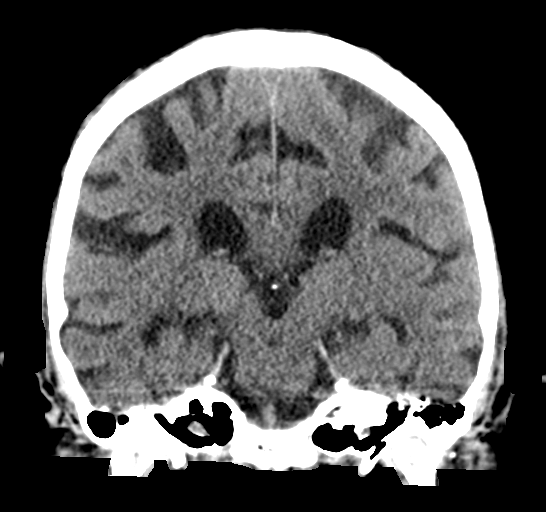
[im 37/66  brain]
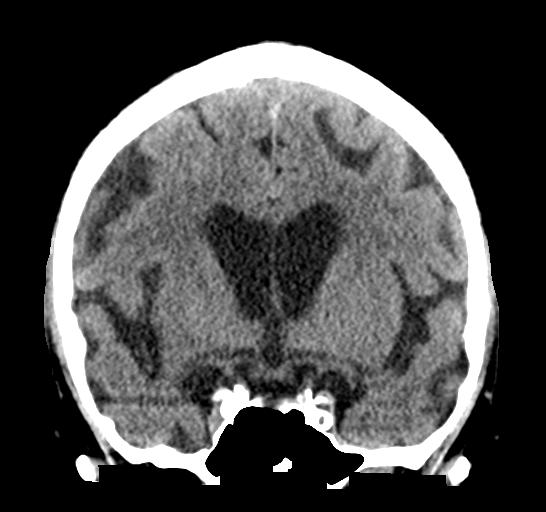

[Series 5: sagittal soft tissue · sagittal · 0.30mm/px · 3 of 61 slices shown]
[im 21/61  brain]
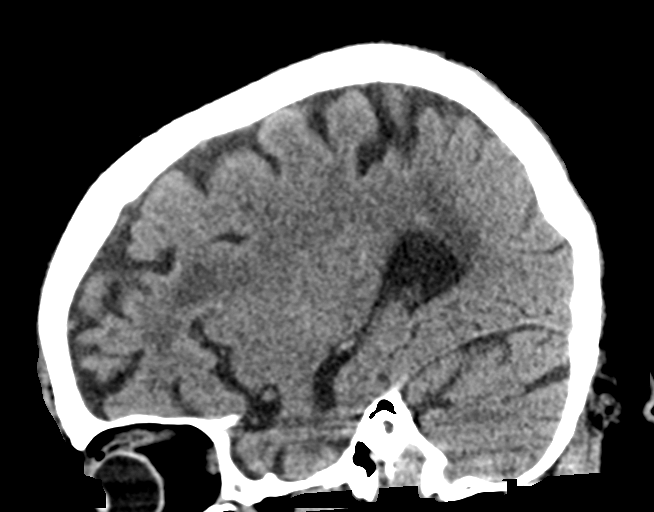
[im 31/61  brain]
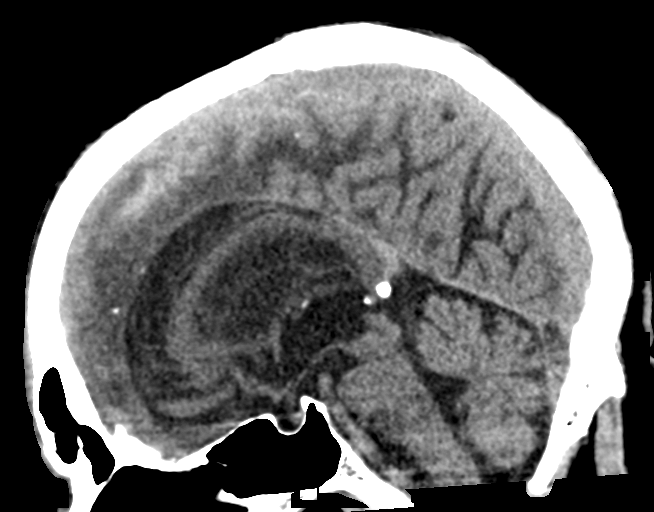
[im 41/61  brain]
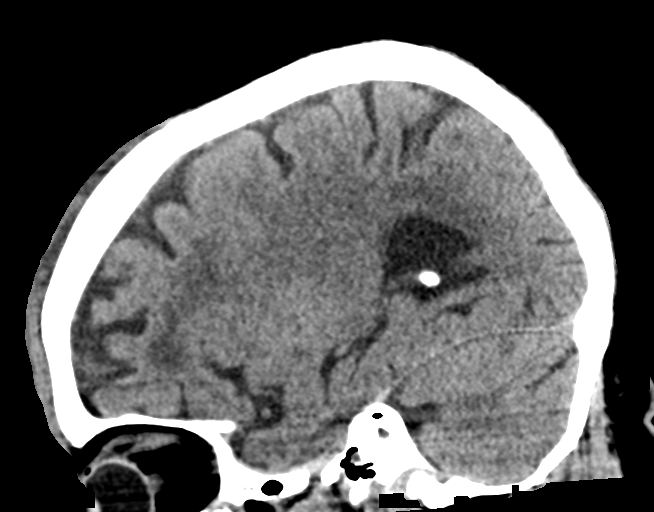

[15 of 47 positions shown; findings below may reference images not displayed]

FINDINGS: Brain: Chronic tiny left thalamic lacune. No evidence of parenchymal
hemorrhage or extra-axial fluid collection. No mass lesion, mass
effect, or midline shift. No CT evidence of acute infarction.
Generalized cerebral volume loss. Nonspecific moderate subcortical
and periventricular white matter hypodensity, most in keeping with
chronic small vessel ischemic change. Cerebral ventricle sizes are
stable and concordant with the degree of cerebral volume loss.

Vascular: No acute abnormality.

Skull: Small to moderate anterior left frontal scalp contusion,
largely new. No evidence of calvarial fracture.

Sinuses/Orbits: Chronic comminuted anterior left frontal sinus
fracture with up to 4 mm depression of the fracture fragments, not
appreciably changed. Chronic medial orbital blowout fractures
bilaterally.

Other:  The mastoid air cells are unopacified.
IMPRESSION: 1. Small to moderate anterior left frontal scalp contusion, largely
new. No evidence of calvarial fracture.
2. Chronic comminuted anterior left frontal sinus fracture. Chronic
medial orbital blowout fractures bilaterally.
3. No evidence of acute intracranial abnormality.
4. Generalized cerebral volume loss and moderate chronic small
vessel ischemic changes in the cerebral white matter.
5. Chronic tiny left thalamic lacune.

## 2021-12-12 IMAGING — DX DG CHEST 1V PORT
1 series · 1 of 1 positions shown · non-contrast
Comparison: 01/10/2017 chest radiograph.

CLINICAL DATA: Weakness, altered mental status

EXAM:
PORTABLE CHEST 1 VIEW

[chest ap]
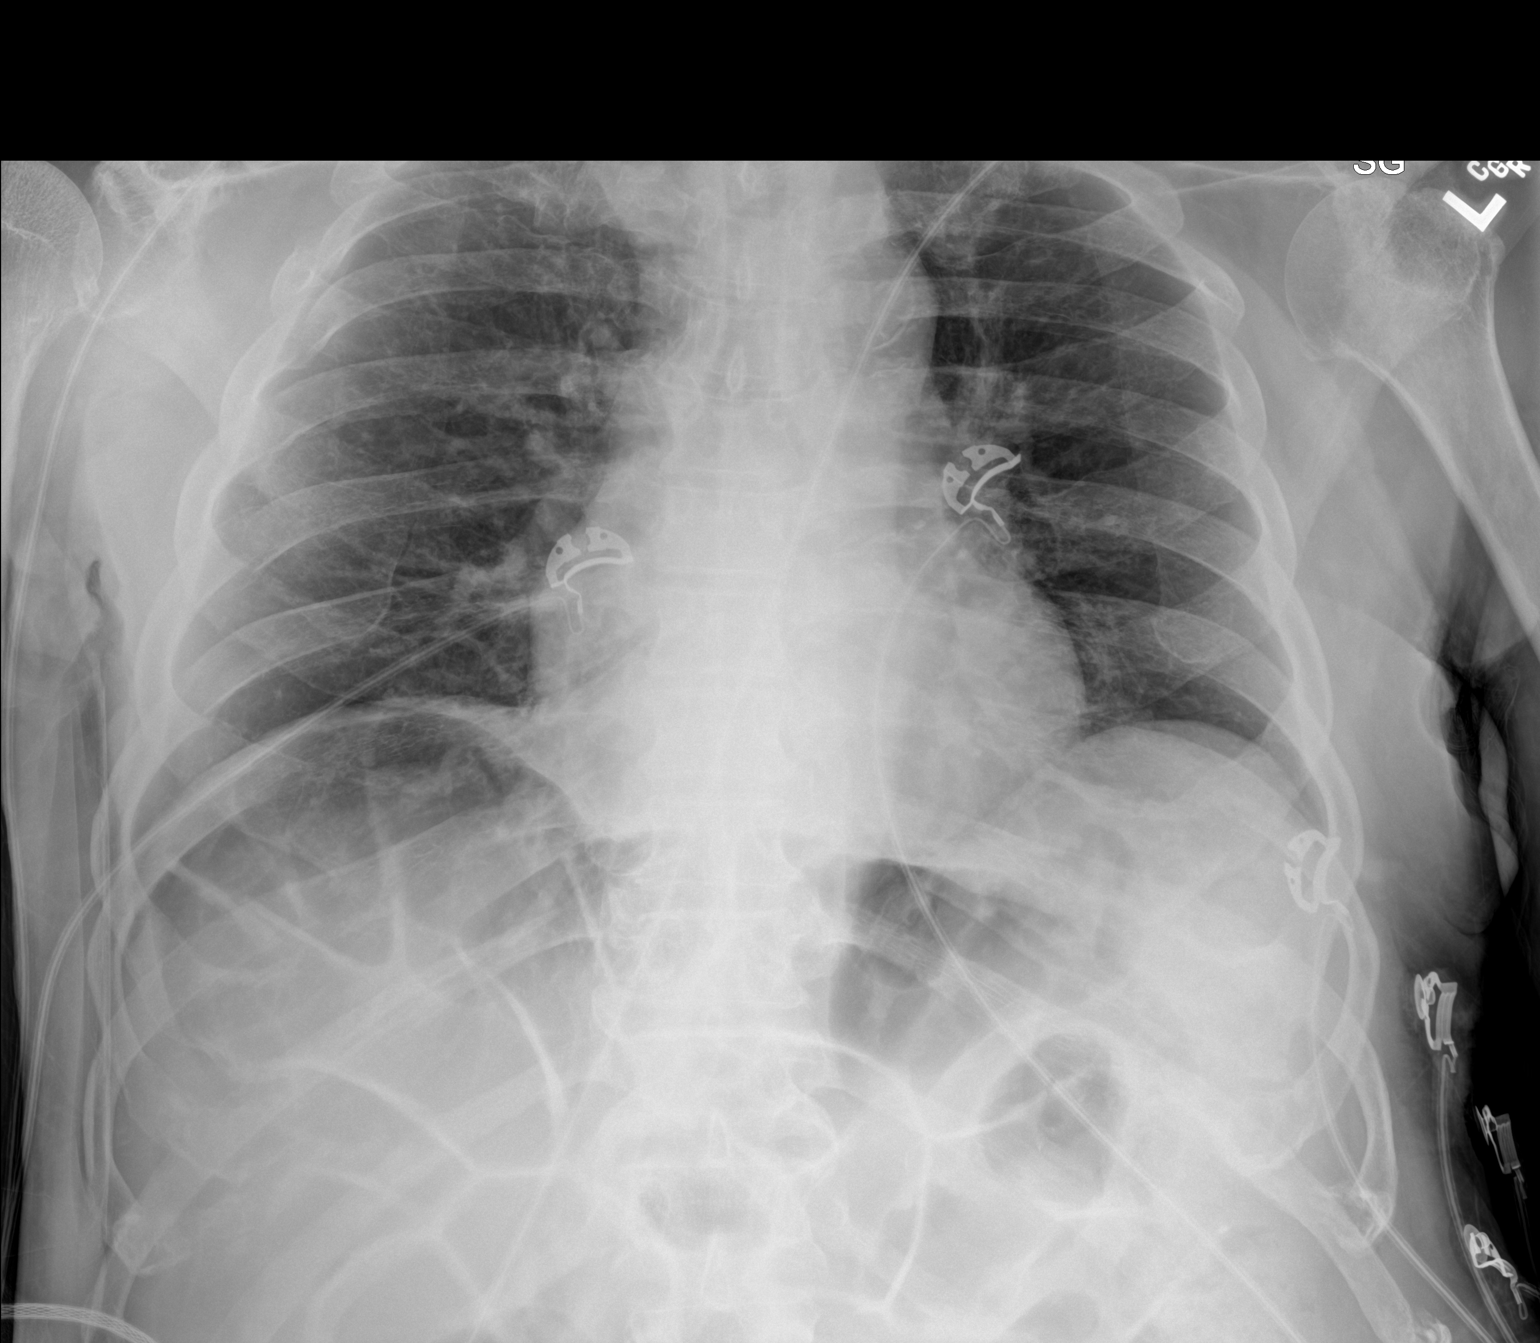

[1 of 1 positions shown; findings below may reference images not displayed]

FINDINGS: Stable cardiomediastinal silhouette with normal heart size. No
pneumothorax. No pleural effusion. Lungs appear clear, with no acute
consolidative airspace disease and no pulmonary edema. Healed
deformities throughout the lateral right ribs.
IMPRESSION: No active disease.
